# Patient Record
Sex: Female | Born: 1980 | Race: White | Hispanic: No | Marital: Single | Smoking: Current every day smoker
Health system: Southern US, Community
[De-identification: ages and names within clinical notes are randomized; demographics above are authoritative.]

## PROBLEM LIST (undated history)

## (undated) ENCOUNTER — Inpatient Hospital Stay (HOSPITAL_COMMUNITY): Payer: Self-pay

## (undated) DIAGNOSIS — O139 Gestational [pregnancy-induced] hypertension without significant proteinuria, unspecified trimester: Secondary | ICD-10-CM

## (undated) DIAGNOSIS — F1110 Opioid abuse, uncomplicated: Secondary | ICD-10-CM

## (undated) DIAGNOSIS — F419 Anxiety disorder, unspecified: Secondary | ICD-10-CM

## (undated) DIAGNOSIS — N200 Calculus of kidney: Secondary | ICD-10-CM

## (undated) DIAGNOSIS — Z3490 Encounter for supervision of normal pregnancy, unspecified, unspecified trimester: Secondary | ICD-10-CM

## (undated) DIAGNOSIS — F99 Mental disorder, not otherwise specified: Secondary | ICD-10-CM

## (undated) DIAGNOSIS — Z22322 Carrier or suspected carrier of Methicillin resistant Staphylococcus aureus: Secondary | ICD-10-CM

## (undated) HISTORY — DX: Gestational (pregnancy-induced) hypertension without significant proteinuria, unspecified trimester: O13.9

## (undated) HISTORY — DX: Opioid abuse, uncomplicated: F11.10

---
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
## 1999-02-03 ENCOUNTER — Emergency Department (HOSPITAL_COMMUNITY): Admission: EM | Admit: 1999-02-03 | Discharge: 1999-02-04 | Payer: Self-pay | Admitting: Emergency Medicine

## 1999-07-24 ENCOUNTER — Emergency Department (HOSPITAL_COMMUNITY): Admission: EM | Admit: 1999-07-24 | Discharge: 1999-07-24 | Payer: Self-pay | Admitting: Emergency Medicine

## 1999-11-25 ENCOUNTER — Encounter: Payer: Self-pay | Admitting: General Practice

## 1999-11-25 ENCOUNTER — Ambulatory Visit (HOSPITAL_COMMUNITY): Admission: RE | Admit: 1999-11-25 | Discharge: 1999-11-25 | Payer: Self-pay | Admitting: General Practice

## 2000-01-20 ENCOUNTER — Inpatient Hospital Stay (HOSPITAL_COMMUNITY): Admission: AD | Admit: 2000-01-20 | Discharge: 2000-01-20 | Payer: Self-pay | Admitting: Obstetrics and Gynecology

## 2000-01-20 ENCOUNTER — Encounter: Payer: Self-pay | Admitting: Obstetrics & Gynecology

## 2000-02-06 ENCOUNTER — Inpatient Hospital Stay (HOSPITAL_COMMUNITY): Admission: AD | Admit: 2000-02-06 | Discharge: 2000-02-06 | Payer: Self-pay | Admitting: Obstetrics and Gynecology

## 2000-02-06 ENCOUNTER — Encounter: Payer: Self-pay | Admitting: Obstetrics and Gynecology

## 2000-02-07 ENCOUNTER — Inpatient Hospital Stay (HOSPITAL_COMMUNITY): Admission: AD | Admit: 2000-02-07 | Discharge: 2000-02-07 | Payer: Self-pay | Admitting: Obstetrics and Gynecology

## 2000-03-16 ENCOUNTER — Encounter: Payer: Self-pay | Admitting: Obstetrics & Gynecology

## 2000-03-16 ENCOUNTER — Ambulatory Visit (HOSPITAL_COMMUNITY): Admission: RE | Admit: 2000-03-16 | Discharge: 2000-03-16 | Payer: Self-pay | Admitting: Obstetrics & Gynecology

## 2000-05-03 ENCOUNTER — Encounter: Payer: Self-pay | Admitting: Obstetrics & Gynecology

## 2000-05-03 ENCOUNTER — Observation Stay (HOSPITAL_COMMUNITY): Admission: AD | Admit: 2000-05-03 | Discharge: 2000-05-04 | Payer: Self-pay | Admitting: Obstetrics and Gynecology

## 2000-06-10 ENCOUNTER — Inpatient Hospital Stay (HOSPITAL_COMMUNITY): Admission: AD | Admit: 2000-06-10 | Discharge: 2000-06-10 | Payer: Self-pay | Admitting: Obstetrics and Gynecology

## 2000-06-11 ENCOUNTER — Inpatient Hospital Stay (HOSPITAL_COMMUNITY): Admission: AD | Admit: 2000-06-11 | Discharge: 2000-06-11 | Payer: Self-pay | Admitting: Obstetrics and Gynecology

## 2000-07-18 ENCOUNTER — Inpatient Hospital Stay (HOSPITAL_COMMUNITY): Admission: AD | Admit: 2000-07-18 | Discharge: 2000-07-18 | Payer: Self-pay | Admitting: Obstetrics and Gynecology

## 2000-07-26 ENCOUNTER — Inpatient Hospital Stay (HOSPITAL_COMMUNITY): Admission: AD | Admit: 2000-07-26 | Discharge: 2000-07-28 | Payer: Self-pay | Admitting: Obstetrics and Gynecology

## 2000-09-06 ENCOUNTER — Other Ambulatory Visit: Admission: RE | Admit: 2000-09-06 | Discharge: 2000-09-06 | Payer: Self-pay | Admitting: Obstetrics and Gynecology

## 2001-07-05 ENCOUNTER — Other Ambulatory Visit: Admission: RE | Admit: 2001-07-05 | Discharge: 2001-07-05 | Payer: Self-pay | Admitting: Obstetrics and Gynecology

## 2002-07-17 ENCOUNTER — Other Ambulatory Visit: Admission: RE | Admit: 2002-07-17 | Discharge: 2002-07-17 | Payer: Self-pay | Admitting: Family Medicine

## 2002-08-05 ENCOUNTER — Encounter: Payer: Self-pay | Admitting: Family Medicine

## 2002-08-05 ENCOUNTER — Encounter: Admission: RE | Admit: 2002-08-05 | Discharge: 2002-08-05 | Payer: Self-pay | Admitting: Family Medicine

## 2002-11-20 ENCOUNTER — Inpatient Hospital Stay (HOSPITAL_COMMUNITY): Admission: AD | Admit: 2002-11-20 | Discharge: 2002-11-20 | Payer: Self-pay | Admitting: Obstetrics and Gynecology

## 2004-04-17 ENCOUNTER — Emergency Department (HOSPITAL_COMMUNITY): Admission: EM | Admit: 2004-04-17 | Discharge: 2004-04-17 | Payer: Self-pay | Admitting: Emergency Medicine

## 2004-04-19 ENCOUNTER — Emergency Department (HOSPITAL_COMMUNITY): Admission: EM | Admit: 2004-04-19 | Discharge: 2004-04-19 | Payer: Self-pay | Admitting: Emergency Medicine

## 2004-04-28 ENCOUNTER — Emergency Department (HOSPITAL_COMMUNITY): Admission: EM | Admit: 2004-04-28 | Discharge: 2004-04-28 | Payer: Self-pay | Admitting: *Deleted

## 2004-04-29 ENCOUNTER — Inpatient Hospital Stay (HOSPITAL_COMMUNITY): Admission: EM | Admit: 2004-04-29 | Discharge: 2004-05-04 | Payer: Self-pay | Admitting: Emergency Medicine

## 2004-04-29 ENCOUNTER — Ambulatory Visit: Payer: Self-pay | Admitting: Infectious Diseases

## 2004-04-30 ENCOUNTER — Ambulatory Visit: Payer: Self-pay | Admitting: Infectious Diseases

## 2004-12-09 ENCOUNTER — Ambulatory Visit: Payer: Self-pay | Admitting: Family Medicine

## 2005-02-08 ENCOUNTER — Inpatient Hospital Stay (HOSPITAL_COMMUNITY): Admission: AD | Admit: 2005-02-08 | Discharge: 2005-02-08 | Payer: Self-pay | Admitting: *Deleted

## 2005-04-02 ENCOUNTER — Emergency Department (HOSPITAL_COMMUNITY): Admission: EM | Admit: 2005-04-02 | Discharge: 2005-04-02 | Payer: Self-pay

## 2005-04-18 ENCOUNTER — Emergency Department (HOSPITAL_COMMUNITY): Admission: EM | Admit: 2005-04-18 | Discharge: 2005-04-18 | Payer: Self-pay | Admitting: Emergency Medicine

## 2005-10-05 ENCOUNTER — Ambulatory Visit: Payer: Self-pay | Admitting: Family Medicine

## 2005-11-04 ENCOUNTER — Ambulatory Visit: Payer: Self-pay | Admitting: Family Medicine

## 2005-12-19 ENCOUNTER — Ambulatory Visit: Payer: Self-pay | Admitting: Family Medicine

## 2006-01-10 ENCOUNTER — Emergency Department (HOSPITAL_COMMUNITY): Admission: EM | Admit: 2006-01-10 | Discharge: 2006-01-10 | Payer: Self-pay | Admitting: Emergency Medicine

## 2006-03-03 ENCOUNTER — Emergency Department (HOSPITAL_COMMUNITY): Admission: EM | Admit: 2006-03-03 | Discharge: 2006-03-03 | Payer: Self-pay | Admitting: Emergency Medicine

## 2006-05-09 ENCOUNTER — Emergency Department (HOSPITAL_COMMUNITY): Admission: EM | Admit: 2006-05-09 | Discharge: 2006-05-09 | Payer: Self-pay | Admitting: Emergency Medicine

## 2006-07-27 ENCOUNTER — Ambulatory Visit: Payer: Self-pay | Admitting: Family Medicine

## 2006-08-01 ENCOUNTER — Ambulatory Visit: Payer: Self-pay | Admitting: Family Medicine

## 2006-10-13 ENCOUNTER — Encounter: Payer: Self-pay | Admitting: Family Medicine

## 2006-10-13 ENCOUNTER — Other Ambulatory Visit: Admission: RE | Admit: 2006-10-13 | Discharge: 2006-10-13 | Payer: Self-pay | Admitting: Family Medicine

## 2006-10-13 ENCOUNTER — Ambulatory Visit: Payer: Self-pay | Admitting: Family Medicine

## 2006-10-13 LAB — CONVERTED CEMR LAB

## 2006-12-12 ENCOUNTER — Ambulatory Visit: Payer: Self-pay | Admitting: Family Medicine

## 2007-01-03 ENCOUNTER — Ambulatory Visit: Payer: Self-pay | Admitting: Internal Medicine

## 2007-03-05 ENCOUNTER — Ambulatory Visit: Payer: Self-pay | Admitting: Family Medicine

## 2007-03-05 ENCOUNTER — Telehealth: Payer: Self-pay | Admitting: Family Medicine

## 2007-03-06 ENCOUNTER — Telehealth: Payer: Self-pay | Admitting: Family Medicine

## 2007-03-29 ENCOUNTER — Ambulatory Visit: Payer: Self-pay | Admitting: Family Medicine

## 2007-03-29 DIAGNOSIS — F329 Major depressive disorder, single episode, unspecified: Secondary | ICD-10-CM

## 2007-04-10 ENCOUNTER — Ambulatory Visit: Payer: Self-pay | Admitting: Family Medicine

## 2007-04-10 DIAGNOSIS — J309 Allergic rhinitis, unspecified: Secondary | ICD-10-CM | POA: Insufficient documentation

## 2007-04-10 DIAGNOSIS — F17200 Nicotine dependence, unspecified, uncomplicated: Secondary | ICD-10-CM

## 2007-04-15 ENCOUNTER — Emergency Department (HOSPITAL_COMMUNITY): Admission: EM | Admit: 2007-04-15 | Discharge: 2007-04-15 | Payer: Self-pay | Admitting: Emergency Medicine

## 2007-04-17 ENCOUNTER — Telehealth: Payer: Self-pay | Admitting: Internal Medicine

## 2007-04-23 ENCOUNTER — Emergency Department (HOSPITAL_COMMUNITY): Admission: EM | Admit: 2007-04-23 | Discharge: 2007-04-23 | Payer: Self-pay | Admitting: Emergency Medicine

## 2007-05-08 ENCOUNTER — Ambulatory Visit: Payer: Self-pay | Admitting: Family Medicine

## 2007-05-08 DIAGNOSIS — J45901 Unspecified asthma with (acute) exacerbation: Secondary | ICD-10-CM | POA: Insufficient documentation

## 2007-05-10 ENCOUNTER — Emergency Department (HOSPITAL_COMMUNITY): Admission: EM | Admit: 2007-05-10 | Discharge: 2007-05-10 | Payer: Self-pay | Admitting: Emergency Medicine

## 2007-05-22 ENCOUNTER — Telehealth: Payer: Self-pay | Admitting: Family Medicine

## 2007-06-05 ENCOUNTER — Emergency Department (HOSPITAL_COMMUNITY): Admission: EM | Admit: 2007-06-05 | Discharge: 2007-06-05 | Payer: Self-pay | Admitting: Emergency Medicine

## 2007-06-05 ENCOUNTER — Telehealth: Payer: Self-pay | Admitting: *Deleted

## 2007-06-11 ENCOUNTER — Ambulatory Visit: Payer: Self-pay | Admitting: Family Medicine

## 2007-06-11 DIAGNOSIS — R51 Headache: Secondary | ICD-10-CM | POA: Insufficient documentation

## 2007-06-11 DIAGNOSIS — R519 Headache, unspecified: Secondary | ICD-10-CM | POA: Insufficient documentation

## 2007-06-12 ENCOUNTER — Telehealth: Payer: Self-pay | Admitting: Family Medicine

## 2007-06-23 DIAGNOSIS — L0201 Cutaneous abscess of face: Secondary | ICD-10-CM

## 2007-06-23 DIAGNOSIS — L03211 Cellulitis of face: Secondary | ICD-10-CM

## 2007-06-26 ENCOUNTER — Ambulatory Visit: Payer: Self-pay | Admitting: Family Medicine

## 2007-06-28 ENCOUNTER — Ambulatory Visit: Payer: Self-pay | Admitting: Family Medicine

## 2007-07-03 ENCOUNTER — Telehealth: Payer: Self-pay | Admitting: Family Medicine

## 2007-07-09 ENCOUNTER — Telehealth: Payer: Self-pay | Admitting: Family Medicine

## 2007-07-09 ENCOUNTER — Ambulatory Visit: Payer: Self-pay | Admitting: Family Medicine

## 2007-07-17 ENCOUNTER — Ambulatory Visit: Payer: Self-pay | Admitting: Family Medicine

## 2007-07-17 DIAGNOSIS — F19939 Other psychoactive substance use, unspecified with withdrawal, unspecified: Secondary | ICD-10-CM | POA: Insufficient documentation

## 2007-07-23 ENCOUNTER — Emergency Department (HOSPITAL_COMMUNITY): Admission: EM | Admit: 2007-07-23 | Discharge: 2007-07-23 | Payer: Self-pay | Admitting: Emergency Medicine

## 2007-07-27 ENCOUNTER — Ambulatory Visit: Payer: Self-pay | Admitting: Internal Medicine

## 2007-07-27 DIAGNOSIS — M79609 Pain in unspecified limb: Secondary | ICD-10-CM

## 2007-08-20 ENCOUNTER — Telehealth: Payer: Self-pay | Admitting: Internal Medicine

## 2007-08-24 ENCOUNTER — Telehealth: Payer: Self-pay | Admitting: Internal Medicine

## 2007-08-31 ENCOUNTER — Ambulatory Visit: Payer: Self-pay | Admitting: Family Medicine

## 2007-08-31 ENCOUNTER — Telehealth: Payer: Self-pay | Admitting: Family Medicine

## 2007-08-31 ENCOUNTER — Emergency Department (HOSPITAL_COMMUNITY): Admission: EM | Admit: 2007-08-31 | Discharge: 2007-08-31 | Payer: Self-pay | Admitting: Emergency Medicine

## 2007-08-31 DIAGNOSIS — M542 Cervicalgia: Secondary | ICD-10-CM

## 2007-09-04 ENCOUNTER — Ambulatory Visit: Payer: Self-pay | Admitting: Family Medicine

## 2007-09-10 ENCOUNTER — Ambulatory Visit: Payer: Self-pay | Admitting: Family Medicine

## 2007-09-10 DIAGNOSIS — J069 Acute upper respiratory infection, unspecified: Secondary | ICD-10-CM | POA: Insufficient documentation

## 2007-09-24 ENCOUNTER — Telehealth: Payer: Self-pay | Admitting: Family Medicine

## 2007-09-26 ENCOUNTER — Telehealth: Payer: Self-pay | Admitting: Family Medicine

## 2007-10-04 ENCOUNTER — Emergency Department (HOSPITAL_COMMUNITY): Admission: EM | Admit: 2007-10-04 | Discharge: 2007-10-04 | Payer: Self-pay | Admitting: Advanced Practice Midwife

## 2007-10-11 ENCOUNTER — Ambulatory Visit: Payer: Self-pay | Admitting: Family Medicine

## 2007-10-11 DIAGNOSIS — IMO0002 Reserved for concepts with insufficient information to code with codable children: Secondary | ICD-10-CM | POA: Insufficient documentation

## 2007-11-17 ENCOUNTER — Emergency Department (HOSPITAL_COMMUNITY): Admission: EM | Admit: 2007-11-17 | Discharge: 2007-11-17 | Payer: Self-pay | Admitting: Emergency Medicine

## 2007-11-23 ENCOUNTER — Telehealth: Payer: Self-pay | Admitting: Family Medicine

## 2007-11-24 ENCOUNTER — Emergency Department (HOSPITAL_COMMUNITY): Admission: EM | Admit: 2007-11-24 | Discharge: 2007-11-24 | Payer: Self-pay | Admitting: Emergency Medicine

## 2007-11-26 ENCOUNTER — Telehealth: Payer: Self-pay | Admitting: Family Medicine

## 2007-12-04 ENCOUNTER — Ambulatory Visit: Payer: Self-pay | Admitting: Family Medicine

## 2007-12-10 ENCOUNTER — Telehealth: Payer: Self-pay | Admitting: Family Medicine

## 2007-12-11 ENCOUNTER — Telehealth: Payer: Self-pay | Admitting: Family Medicine

## 2007-12-13 ENCOUNTER — Ambulatory Visit: Payer: Self-pay | Admitting: Licensed Clinical Social Worker

## 2007-12-14 ENCOUNTER — Ambulatory Visit: Payer: Self-pay | Admitting: Family Medicine

## 2007-12-14 DIAGNOSIS — F411 Generalized anxiety disorder: Secondary | ICD-10-CM | POA: Insufficient documentation

## 2007-12-20 ENCOUNTER — Telehealth: Payer: Self-pay | Admitting: Family Medicine

## 2007-12-22 DIAGNOSIS — IMO0001 Reserved for inherently not codable concepts without codable children: Secondary | ICD-10-CM

## 2007-12-24 ENCOUNTER — Ambulatory Visit: Payer: Self-pay | Admitting: Family Medicine

## 2007-12-25 ENCOUNTER — Ambulatory Visit: Payer: Self-pay | Admitting: Licensed Clinical Social Worker

## 2008-01-01 ENCOUNTER — Ambulatory Visit: Payer: Self-pay | Admitting: Family Medicine

## 2008-01-01 DIAGNOSIS — E282 Polycystic ovarian syndrome: Secondary | ICD-10-CM | POA: Insufficient documentation

## 2008-01-01 LAB — CONVERTED CEMR LAB
Alkaline Phosphatase: 73 units/L (ref 39–117)
Basophils Absolute: 0.3 10*3/uL — ABNORMAL HIGH (ref 0.0–0.1)
Bilirubin, Direct: 0.1 mg/dL (ref 0.0–0.3)
CO2: 27 meq/L (ref 19–32)
Calcium: 9.6 mg/dL (ref 8.4–10.5)
FSH: 5.9 milliintl units/mL
GFR calc Af Amer: 96 mL/min
Glucose, Bld: 102 mg/dL — ABNORMAL HIGH (ref 70–99)
LH: 6.5 milliintl units/mL
Lymphocytes Relative: 18.3 % (ref 12.0–46.0)
MCHC: 35.3 g/dL (ref 30.0–36.0)
Monocytes Absolute: 0.1 10*3/uL (ref 0.1–1.0)
Monocytes Relative: 1.2 % — ABNORMAL LOW (ref 3.0–12.0)
Platelets: 173 10*3/uL (ref 150–400)
Potassium: 3.4 meq/L — ABNORMAL LOW (ref 3.5–5.1)
RDW: 12.5 % (ref 11.5–14.6)
Sodium: 142 meq/L (ref 135–145)
TSH: 0.87 microintl units/mL (ref 0.35–5.50)
Total Bilirubin: 0.8 mg/dL (ref 0.3–1.2)
Total Protein: 7.5 g/dL (ref 6.0–8.3)

## 2008-01-03 ENCOUNTER — Encounter: Admission: RE | Admit: 2008-01-03 | Discharge: 2008-01-03 | Payer: Self-pay | Admitting: Family Medicine

## 2008-01-03 ENCOUNTER — Encounter: Payer: Self-pay | Admitting: Family Medicine

## 2008-01-03 LAB — CONVERTED CEMR LAB: Testosterone: 105.43 ng/dL — ABNORMAL HIGH (ref 10–70)

## 2008-01-04 ENCOUNTER — Telehealth (INDEPENDENT_AMBULATORY_CARE_PROVIDER_SITE_OTHER): Payer: Self-pay | Admitting: *Deleted

## 2008-01-10 ENCOUNTER — Telehealth: Payer: Self-pay | Admitting: Family Medicine

## 2008-01-16 ENCOUNTER — Telehealth: Payer: Self-pay | Admitting: Family Medicine

## 2008-01-28 ENCOUNTER — Ambulatory Visit: Payer: Self-pay | Admitting: Family Medicine

## 2008-01-28 DIAGNOSIS — B079 Viral wart, unspecified: Secondary | ICD-10-CM | POA: Insufficient documentation

## 2008-02-08 ENCOUNTER — Telehealth: Payer: Self-pay | Admitting: Endocrinology

## 2008-02-08 ENCOUNTER — Ambulatory Visit: Payer: Self-pay | Admitting: Endocrinology

## 2008-02-11 ENCOUNTER — Emergency Department (HOSPITAL_COMMUNITY): Admission: EM | Admit: 2008-02-11 | Discharge: 2008-02-11 | Payer: Self-pay | Admitting: Emergency Medicine

## 2008-02-14 ENCOUNTER — Telehealth: Payer: Self-pay | Admitting: Endocrinology

## 2008-02-16 ENCOUNTER — Emergency Department (HOSPITAL_BASED_OUTPATIENT_CLINIC_OR_DEPARTMENT_OTHER): Admission: EM | Admit: 2008-02-16 | Discharge: 2008-02-16 | Payer: Self-pay | Admitting: Emergency Medicine

## 2008-02-18 ENCOUNTER — Telehealth: Payer: Self-pay | Admitting: Family Medicine

## 2008-02-19 ENCOUNTER — Telehealth: Payer: Self-pay | Admitting: Family Medicine

## 2008-02-20 ENCOUNTER — Telehealth: Payer: Self-pay | Admitting: *Deleted

## 2008-02-27 ENCOUNTER — Telehealth: Payer: Self-pay | Admitting: Family Medicine

## 2008-02-28 ENCOUNTER — Ambulatory Visit: Payer: Self-pay | Admitting: Family Medicine

## 2008-03-11 ENCOUNTER — Telehealth: Payer: Self-pay | Admitting: *Deleted

## 2008-03-17 ENCOUNTER — Ambulatory Visit: Payer: Self-pay | Admitting: Internal Medicine

## 2008-03-27 ENCOUNTER — Telehealth: Payer: Self-pay | Admitting: *Deleted

## 2008-03-31 ENCOUNTER — Ambulatory Visit: Payer: Self-pay | Admitting: Family Medicine

## 2008-04-29 ENCOUNTER — Telehealth: Payer: Self-pay | Admitting: Family Medicine

## 2008-05-01 ENCOUNTER — Ambulatory Visit: Payer: Self-pay | Admitting: Family Medicine

## 2008-05-06 ENCOUNTER — Telehealth: Payer: Self-pay | Admitting: Family Medicine

## 2008-05-06 ENCOUNTER — Emergency Department (HOSPITAL_COMMUNITY): Admission: EM | Admit: 2008-05-06 | Discharge: 2008-05-06 | Payer: Self-pay | Admitting: Emergency Medicine

## 2008-05-12 ENCOUNTER — Telehealth: Payer: Self-pay | Admitting: Family Medicine

## 2008-05-13 ENCOUNTER — Ambulatory Visit: Payer: Self-pay | Admitting: Family Medicine

## 2008-06-23 ENCOUNTER — Ambulatory Visit: Payer: Self-pay | Admitting: Family Medicine

## 2008-06-23 LAB — CONVERTED CEMR LAB
Amphetamine Screen, Ur: NEGATIVE
BUN: 6 mg/dL (ref 6–23)
Barbiturate Quant, Ur: NEGATIVE
CO2: 29 meq/L (ref 19–32)
Chloride: 107 meq/L (ref 96–112)
Cocaine Metabolites: NEGATIVE
Creatinine,U: 89.3 mg/dL
Eosinophils Relative: 0.7 % (ref 0.0–5.0)
GFR calc Af Amer: 110 mL/min
Glucose, Bld: 91 mg/dL (ref 70–99)
HCT: 43.7 % (ref 36.0–46.0)
Lymphocytes Relative: 23.3 % (ref 12.0–46.0)
Marijuana Metabolite: NEGATIVE
Monocytes Relative: 5.2 % (ref 3.0–12.0)
Neutrophils Relative %: 70.4 % (ref 43.0–77.0)
Opiates: POSITIVE — AB
Phencyclidine (PCP): NEGATIVE
Platelets: 173 10*3/uL (ref 150–400)
Potassium: 4.1 meq/L (ref 3.5–5.1)
RDW: 11.7 % (ref 11.5–14.6)
Sodium: 143 meq/L (ref 135–145)
WBC: 11.8 10*3/uL — ABNORMAL HIGH (ref 4.5–10.5)

## 2008-06-27 ENCOUNTER — Telehealth: Payer: Self-pay | Admitting: Family Medicine

## 2008-07-02 ENCOUNTER — Ambulatory Visit: Payer: Self-pay | Admitting: Family Medicine

## 2008-07-05 ENCOUNTER — Telehealth: Payer: Self-pay | Admitting: Family Medicine

## 2008-07-07 ENCOUNTER — Telehealth: Payer: Self-pay | Admitting: Family Medicine

## 2008-07-26 ENCOUNTER — Emergency Department (HOSPITAL_COMMUNITY): Admission: EM | Admit: 2008-07-26 | Discharge: 2008-07-26 | Payer: Self-pay | Admitting: Emergency Medicine

## 2008-07-30 ENCOUNTER — Telehealth: Payer: Self-pay | Admitting: *Deleted

## 2008-08-11 ENCOUNTER — Telehealth: Payer: Self-pay | Admitting: Family Medicine

## 2008-08-11 ENCOUNTER — Emergency Department (HOSPITAL_COMMUNITY): Admission: EM | Admit: 2008-08-11 | Discharge: 2008-08-11 | Payer: Self-pay | Admitting: Emergency Medicine

## 2008-09-03 ENCOUNTER — Telehealth: Payer: Self-pay | Admitting: Family Medicine

## 2008-09-11 ENCOUNTER — Telehealth: Payer: Self-pay | Admitting: Family Medicine

## 2008-09-18 ENCOUNTER — Telehealth: Payer: Self-pay | Admitting: Family Medicine

## 2008-10-01 ENCOUNTER — Telehealth: Payer: Self-pay | Admitting: Family Medicine

## 2008-10-06 ENCOUNTER — Telehealth: Payer: Self-pay | Admitting: Family Medicine

## 2008-10-21 ENCOUNTER — Telehealth: Payer: Self-pay | Admitting: *Deleted

## 2008-10-22 ENCOUNTER — Ambulatory Visit: Payer: Self-pay | Admitting: Family Medicine

## 2008-10-22 DIAGNOSIS — M545 Low back pain: Secondary | ICD-10-CM | POA: Insufficient documentation

## 2008-11-04 ENCOUNTER — Telehealth: Payer: Self-pay | Admitting: Internal Medicine

## 2008-11-10 ENCOUNTER — Telehealth: Payer: Self-pay | Admitting: Family Medicine

## 2008-11-13 ENCOUNTER — Ambulatory Visit: Payer: Self-pay | Admitting: Family Medicine

## 2008-11-24 ENCOUNTER — Emergency Department (HOSPITAL_COMMUNITY): Admission: EM | Admit: 2008-11-24 | Discharge: 2008-11-24 | Payer: Self-pay | Admitting: Emergency Medicine

## 2008-12-01 ENCOUNTER — Telehealth: Payer: Self-pay | Admitting: Family Medicine

## 2008-12-08 ENCOUNTER — Telehealth: Payer: Self-pay | Admitting: Family Medicine

## 2009-02-24 ENCOUNTER — Telehealth: Payer: Self-pay | Admitting: *Deleted

## 2009-03-04 ENCOUNTER — Emergency Department (HOSPITAL_BASED_OUTPATIENT_CLINIC_OR_DEPARTMENT_OTHER): Admission: EM | Admit: 2009-03-04 | Discharge: 2009-03-04 | Payer: Self-pay | Admitting: Emergency Medicine

## 2009-03-04 ENCOUNTER — Ambulatory Visit: Payer: Self-pay | Admitting: Diagnostic Radiology

## 2009-03-17 ENCOUNTER — Telehealth: Payer: Self-pay | Admitting: Family Medicine

## 2009-04-24 ENCOUNTER — Ambulatory Visit: Payer: Self-pay | Admitting: Interventional Radiology

## 2009-04-24 ENCOUNTER — Emergency Department (HOSPITAL_BASED_OUTPATIENT_CLINIC_OR_DEPARTMENT_OTHER): Admission: EM | Admit: 2009-04-24 | Discharge: 2009-04-24 | Payer: Self-pay | Admitting: Emergency Medicine

## 2009-04-28 ENCOUNTER — Emergency Department (HOSPITAL_BASED_OUTPATIENT_CLINIC_OR_DEPARTMENT_OTHER): Admission: EM | Admit: 2009-04-28 | Discharge: 2009-04-28 | Payer: Self-pay | Admitting: Emergency Medicine

## 2009-04-28 ENCOUNTER — Ambulatory Visit: Payer: Self-pay | Admitting: Diagnostic Radiology

## 2009-05-11 ENCOUNTER — Telehealth: Payer: Self-pay | Admitting: Family Medicine

## 2009-05-14 ENCOUNTER — Emergency Department (HOSPITAL_BASED_OUTPATIENT_CLINIC_OR_DEPARTMENT_OTHER): Admission: EM | Admit: 2009-05-14 | Discharge: 2009-05-14 | Payer: Self-pay | Admitting: Emergency Medicine

## 2009-06-29 ENCOUNTER — Telehealth: Payer: Self-pay | Admitting: Family Medicine

## 2009-07-01 ENCOUNTER — Telehealth: Payer: Self-pay | Admitting: Family Medicine

## 2009-07-06 ENCOUNTER — Telehealth: Payer: Self-pay | Admitting: *Deleted

## 2009-08-20 ENCOUNTER — Emergency Department (HOSPITAL_COMMUNITY): Admission: EM | Admit: 2009-08-20 | Discharge: 2009-08-20 | Payer: Self-pay | Admitting: Emergency Medicine

## 2009-09-18 ENCOUNTER — Telehealth: Payer: Self-pay | Admitting: Family Medicine

## 2009-10-01 ENCOUNTER — Ambulatory Visit: Payer: Self-pay | Admitting: Family Medicine

## 2009-10-04 ENCOUNTER — Encounter: Payer: Self-pay | Admitting: Family Medicine

## 2009-10-05 ENCOUNTER — Telehealth: Payer: Self-pay | Admitting: Family Medicine

## 2009-10-15 ENCOUNTER — Telehealth: Payer: Self-pay | Admitting: Family Medicine

## 2009-11-09 ENCOUNTER — Ambulatory Visit: Payer: Self-pay | Admitting: Family Medicine

## 2009-11-10 ENCOUNTER — Emergency Department (HOSPITAL_COMMUNITY): Admission: EM | Admit: 2009-11-10 | Discharge: 2009-11-10 | Payer: Self-pay | Admitting: Emergency Medicine

## 2009-11-16 ENCOUNTER — Telehealth: Payer: Self-pay | Admitting: Internal Medicine

## 2009-11-17 ENCOUNTER — Telehealth: Payer: Self-pay

## 2009-11-18 ENCOUNTER — Telehealth: Payer: Self-pay | Admitting: Family Medicine

## 2009-11-24 ENCOUNTER — Encounter: Payer: Self-pay | Admitting: Family Medicine

## 2009-12-03 ENCOUNTER — Telehealth (INDEPENDENT_AMBULATORY_CARE_PROVIDER_SITE_OTHER): Payer: Self-pay | Admitting: *Deleted

## 2009-12-10 ENCOUNTER — Emergency Department (HOSPITAL_COMMUNITY): Admission: EM | Admit: 2009-12-10 | Discharge: 2009-12-10 | Payer: Self-pay | Admitting: Emergency Medicine

## 2010-01-10 ENCOUNTER — Emergency Department (HOSPITAL_COMMUNITY): Admission: EM | Admit: 2010-01-10 | Discharge: 2010-01-10 | Payer: Self-pay | Admitting: Emergency Medicine

## 2010-01-17 ENCOUNTER — Emergency Department (HOSPITAL_COMMUNITY): Admission: EM | Admit: 2010-01-17 | Discharge: 2010-01-17 | Payer: Self-pay | Admitting: Emergency Medicine

## 2010-01-27 ENCOUNTER — Emergency Department (HOSPITAL_COMMUNITY): Admission: EM | Admit: 2010-01-27 | Discharge: 2010-01-27 | Payer: Self-pay | Admitting: Emergency Medicine

## 2010-02-13 ENCOUNTER — Emergency Department: Payer: Self-pay | Admitting: Emergency Medicine

## 2010-02-19 ENCOUNTER — Emergency Department (HOSPITAL_COMMUNITY)
Admission: EM | Admit: 2010-02-19 | Discharge: 2010-02-19 | Payer: Self-pay | Source: Home / Self Care | Admitting: Emergency Medicine

## 2010-03-01 ENCOUNTER — Emergency Department (HOSPITAL_COMMUNITY): Admission: EM | Admit: 2010-03-01 | Discharge: 2010-03-01 | Payer: Self-pay | Admitting: Emergency Medicine

## 2010-07-06 ENCOUNTER — Emergency Department (HOSPITAL_COMMUNITY): Admission: EM | Admit: 2010-07-06 | Discharge: 2010-07-06 | Payer: Self-pay | Admitting: Emergency Medicine

## 2010-07-13 ENCOUNTER — Encounter: Admission: RE | Admit: 2010-07-13 | Discharge: 2010-07-13 | Payer: Self-pay | Admitting: Geriatric Medicine

## 2010-08-05 ENCOUNTER — Emergency Department (HOSPITAL_BASED_OUTPATIENT_CLINIC_OR_DEPARTMENT_OTHER): Admission: EM | Admit: 2010-08-05 | Discharge: 2010-03-22 | Payer: Self-pay | Admitting: Emergency Medicine

## 2010-09-18 ENCOUNTER — Encounter: Payer: Self-pay | Admitting: Emergency Medicine

## 2010-09-19 ENCOUNTER — Encounter: Payer: Self-pay | Admitting: Geriatric Medicine

## 2010-09-28 NOTE — Progress Notes (Signed)
Summary: Dismissal from the pracrice question  This patient is dismissed from [facility] by Kelle Darting, MD. Do you agree with the dismissal or do you wish to continue to treat this patient? Thank You. Rene Kocher Flowers  December 03, 2009 2:21 PM   Appended Document: Agree with dismissal my policy is that if another [place] dismisses, i dismiss also, so ok with me.

## 2010-09-28 NOTE — Letter (Signed)
Summary: Discharge Letter  [place] at [facility]  [address] [place], [place] [address]   Phone: [phone]  Fax: [phone]       11/24/2009 MRN: [id number]  [name] [address] [place], [place]  [address]  Dear Rebecca [name],   I find it necessary to inform you that I will not be able to provide medical care to you, because you have obtained pain medications from another physician, which is a violation of the signed controlled substanced contract.  Since your condition requires medical attention, I suggest that you place your self under the care of another physician without delay. If you desire, I will be available for emergency care for 30 days after you receive this letter.  This should give you ample time to select a physician of your choice from the many competent providers in this area. You may want to call the local medical society or [facility]'s physician referral service [phone]) for their assistance in locating a new physician. With your written authorization, I will make a copy of your medical record available to your new physician.   Sincerely,    Eugenio Hoes. Tawanna Cooler, MD  Appended Document: Discharge Letter IDX and EMR have been updated to reflect patient discharge. Letter sent out by Certified mail.  Appended Document: Discharge Letter Received USPS receipt signed by patient verifying delivery.

## 2010-09-28 NOTE — Progress Notes (Signed)
Summary: Pt req early refill auth for cough syrup  Phone Note Call from Patient Call back at [phone] or [phone] cell   Caller: Patient Summary of Call: Pt called and said that Dr. Tawanna Cooler had prescribed a cough syrup for her. Pt said that everyone in her family has been sick and has been taking her cough syrup. Pt is wondering if Dr. Tawanna Cooler would authorize an early refill of med. [facility].  Initial call taken by: Lucy Antigua,  October 05, 2009 10:43 AM  Follow-up for Phone Call        no...............cannot give the medicine to family members Follow-up by: Roderick Pee MD,  October 05, 2009 11:36 AM  Additional Follow-up for Phone Call Additional follow up Details #1::        pt is aware of above. Wants to speak with Fleet Contras. Additional Follow-up by: Warnell Forester,  October 05, 2009 1:06 PM    Additional Follow-up for Phone Call Additional follow up Details #2::     Pt called and said that she was not aware that she could not share medicine, and that she is now aware and she will not share meds again. Pt says she is completely out of cough syrup.  Follow-up by: Lucy Antigua,  October 05, 2009 1:15 PM  Additional Follow-up for Phone Call Additional follow up Details #3:: Details for Additional Follow-up Action Taken: PT Cleotis Lema, PT IS OFFICE CLOSE AT 5PM  Additional Follow-up by: Heron Sabins,  October 05, 2009 3:17 PM

## 2010-09-28 NOTE — Assessment & Plan Note (Signed)
Summary: SORE THROAT, CONGESTION // RS   Vital Signs:  Patient profile:   30 year old female Height:      68 inches Weight:      222 pounds BMI:     33.88 Temp:     99.3 degrees F oral BP sitting:   126 / 86  (left arm) Cuff size:   regular  Vitals Entered By: Kern Reap CMA Duncan Dull) (October 01, 2009 12:51 PM)  Reason for Visit dry cough, fever, headache  History of Present Illness: Rebecca [name] is a 30 year old single female, smoker, but has decreased her consumption to 3 cigarettes a day, because she had a cold for 3 days.  She said congestion running nose, and cough.  No wheezing, no sputum production.  Review of systems 12 point negative.  We talked about smoking cessation.  She would like to try again.  We will start her on the chantix program.  She would also like a refill of her Vicodin.  She takes one tablet in the morning because of chronic back pain.  She works full time and is supposed to get insurance in March at that point time, she would like to see the folks at [facility] spine, who have helped her sister.  We will have her sign a pain contract.  Last contract 2009  Lamiah is now living with her mother.  Her husband got out of jail last year, but returned to alcohol and got his fifth DUI.  She is trying to get a divorce.  She also wishes to go school and learn something in the health field  Allergies: 1)  ! Celexa (Citalopram Hydrobromide) 2)  Darvocet-N 100  Past History:  Past medical, surgical, family and social histories (including risk factors) reviewed for relevance to current acute and chronic problems.  Past Medical History: Reviewed history from 06/23/2008 and no changes required. Depression Headache hospitalized for facial abscess MRSA tobacco abuse Anxiety childbirth x 1 chronic pain, back  Family History: Reviewed history from 02/08/2008 and no changes required. sister has pco mother has high testosterone no dm  Social History: Reviewed  history from 10/22/2008 and no changes required. 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 No apparent alopecia or balding. Eyes:  No corneal or conjunctival inflammation noted. EOMI. Perrla. Funduscopic exam benign, without hemorrhages, exudates or papilledema. Vision grossly normal. Ears:  External ear exam shows no significant lesions or deformities.  Otoscopic examination reveals clear canals, tympanic membranes are intact bilaterally without bulging, retraction, inflammation or discharge. Hearing is grossly normal bilaterally. Nose:  External nasal examination shows no deformity or inflammation. Nasal mucosa are pink and moist without lesions or exudates. Mouth:  Oral mucosa and oropharynx without lesions or exudates.  Teeth in good repair. Neck:  No deformities, masses, or tenderness noted. Chest Wall:  No deformities, masses, or tenderness noted. Lungs:  Normal respiratory effort, chest expands symmetrically. Lungs are clear to auscultation, no crackles or wheezes.   Problems:  Medical Problems Added: 1)  Dx of Viral Infection-unspec  (ICD-079.99)  Impression & Recommendations:  Problem # 1:  VIRAL INFECTION-UNSPEC (ICD-079.99) Assessment New  Her updated medication list for this problem includes:    Hydromet 5-1.5 Mg/79ml Syrp (Hydrocodone-homatropine) .Marland KitchenMarland KitchenMarland KitchenMarland Kitchen 1  or 2 tsps at bedtime as needed  Problem # 2:  BACK PAIN, LUMBAR, CHRONIC (ICD-724.2) Assessment: Unchanged  Her updated medication list for this problem includes:    Vicodin Es 7.5-750 Mg Tabs (Hydrocodone-acetaminophen) .Marland Kitchen... Take 1 tablet by mouth every morning  Complete Medication List: 1)   Chantix Continuing Month Pak 1 Mg Tabs (Varenicline tartrate) .... Uad 2)  Daily Vitamins Tabs (Multiple vitamin) .... Once daily 3)  B-12 100 Mcg Tabs (Cyanocobalamin) .... Once daily 4)  Ativan 0.5 Mg Tabs (Lorazepam) .... Take 1 tablet by mouth two times a day 5)  Ventolin Hfa 108 (90 Base) Mcg/act Aers (Albuterol sulfate) .... 2 ps qid as needed 6)  Fluoxetine Hcl 40 Mg Caps (Fluoxetine hcl) .... Take one tab two times a day 7)  Vicodin Es 7.5-750 Mg Tabs (Hydrocodone-acetaminophen) .... Take 1 tablet by mouth every morning 8)  Hydromet 5-1.5 Mg/63ml Syrp (Hydrocodone-homatropine) .Marland Kitchen.. 1 or 2 tsps at bedtime as needed  Patient Instructions: 1)  Get plenty of rest, drink lots of clear liquids, and use Tylenol or Ibuprofen for fever and comfort. Return in 7-10 days if you're not better:sooner if you're feeling worse. 2)  Take 650-1000mg  of Tylenol every 4-6 hours as needed for relief of pain or comfort of fever AVOID taking more than 4000mg   in a 24 hour period (can cause liver damage in higher doses). 3)  consume 30 ounces of water daily, he may take one or 2 teaspoons of Hydromet at bedtime as needed for nighttime cough. 4)  Start the chantix one half tablet twice a day. 5)  I will write you a prescription for 30 Vicodin and one refill until we can get you to see the back specialist Prescriptions: HYDROMET 5-1.5 MG/5ML SYRP (HYDROCODONE-HOMATROPINE) 1 or 2 tsps at bedtime as needed  #8oz x 1   Entered and Authorized by:   Roderick Pee MD   Signed by:   Roderick Pee MD on 10/01/2009   Method used:   Print then Give to Patient   RxID:   [id number] VICODIN ES 7.5-750 MG TABS (HYDROCODONE-ACETAMINOPHEN) Take 1 tablet by mouth every morning  #30 x 1   Entered and Authorized by:   Roderick Pee MD   Signed by:   Roderick Pee MD on 10/01/2009   Method used:   Print then Give to Patient   RxID:   [id number] CHANTIX CONTINUING MONTH PAK 1 MG TABS (VARENICLINE TARTRATE) UAD  #1  x 3   Entered and Authorized by:   Roderick Pee MD   Signed by:   Roderick Pee MD on 10/01/2009   Method used:   Print then Give to Patient   RxID:   [id number]

## 2010-09-28 NOTE — Progress Notes (Signed)
Summary: Pt req early refill of Hydromet Syrup  Phone Note Call from Patient Call back at [phone] Cell   or [phone] ext [phone] Work   Caller: Patient Summary of Call: Pt is req early refill of Hydromet Syrup. Refill is due on Saturday, but pt will run out before then. [facility] [address]    Initial call taken by: Lucy Antigua,  November 16, 2009 2:37 PM    Prescriptions: HYDROMET 5-1.5 MG/5ML SYRP (HYDROCODONE-HOMATROPINE) 1 or 2 tsps at bedtime as needed  #4 oz x 1   Entered by:   Duard Brady LPN   Authorized by:   Gordy Savers  MD   Signed by:   Duard Brady LPN on 44/08/270   Method used:   Historical   RxID:   [id number]  6 oz per Dr. Amador Cunas - [facility] [address]  kik  Appended Document: Pt req early refill of Hydromet Syrup after futher review of chart and discussion with rachel, suandrea and Dr. Amador Cunas - there is a contract r/t controlled substances with in her chart - this rx was re-tracted from phamracy who had placed it on hold until they heard from me. I spoke with Marcelle Smiling at [facility]. [phone]   KIK

## 2010-09-28 NOTE — Progress Notes (Signed)
Summary: wants  return call  Phone Note Call from Patient Call back at [phone]   Caller: Patient---live call Summary of Call: wants Selena Batten to return call.  Initial call taken by: Warnell Forester,  November 18, 2009 8:58 AM  Follow-up for Phone Call        attempt to call back at 3 different Numbers yesterday as noted in chart. Call forwarded to [facility] to speak with pt. KIK Follow-up by: Duard Brady LPN,  November 18, 2009 9:20 AM  Additional Follow-up for Phone Call Additional follow up Details #1::        called in the original rx. patient can pick up 4oz refill Saturday. patient is aware Additional Follow-up by: Kern Reap CMA Duncan Dull),  November 18, 2009 10:39 AM

## 2010-09-28 NOTE — Progress Notes (Signed)
Summary: hydromet- denied  Phone Note Call from Patient   Caller: Patient Reason for Call: Talk to Nurse Summary of Call: took call from pt r/t refill on hydromet - I explained that we were handling rx as soon as we could , Dr. Tawanna Cooler not here, I would have to get with Dr. Amador Cunas about the fact that the pharmacy is telling here it is too soon to fill . She states she just doesnt want to run out. I explained that i would speak with Dr. Amador Cunas but it would be later this afternoon before I could let her know anything. I also asked her to not call again , she has made multiple calls about this today and it would not speed the process any, i asked her to allow [place] to handle it , and I would let her know, and not to check with pharmacy before 4pm today.This is a patient of Dr. Higinio Plan and I will consult with Fleet Contras too.  KIK Initial call taken by: Duard Brady LPN,  November 17, 2009 1:30 PM  Follow-up for Phone Call        pt just called office again to check on status of refill - I took call - informed her that as I indicated earlier - I was trying to handle as soon as possible and i had asked her not to call , I would call her when resolved - she told me the pharmacy was waiting . I informed her I had been on the phone with them and they are also aware. I will call her before end of day. Tim Lair and Dr. Amador Cunas have been made aware. Follow-up by: Duard Brady LPN,  November 17, 2009 3:49 PM    Additional Follow-up for Phone Call Additional follow up Details #2::    I [phone] , spoke with pharmacist -asked what and who rx'd any controlled substance to pt . I was told percocet was filled 11/11/2009 written by Dr. Estell Harpin and was filled at [facility] in [place]. there were other meds but they were from last summer.  I then spoke with rachel and have been made aware of contract in chart about controlled substances - I will inform pt that Dr. Tawanna Cooler not here ,  if she runs out and needs med she will have to go to ER or wait until Monday to be seen by Dr. Tawanna Cooler.  Follow-up by: Duard Brady LPN,  November 17, 2009 3:54 PM  Additional Follow-up for Phone Call Additional follow up Details #3:: Details for Additional Follow-up Action Taken: [facility] spoke with natash and told not to fill rx and I would be calling pt. KIK I called cell # - 'vioce mail not set up , so I could not leave message , work # given was incorrect - no there by that name , called hm # - no ans no mach. KIK was going to inform no refill at this time due to contract and Dr. Joellyn Haff out of office until monday - if she runs out and needs med - will have to be seen in er . Will offer appt with Dr. Tawanna Cooler on Monday. KIK Additional Follow-up by: Duard Brady LPN,  November 17, 2009 4:45 PM

## 2010-09-28 NOTE — Letter (Signed)
Summary: [facility]  [facility]Nurse   Imported By: Maryln Gottron 10/06/2009 12:55:26  _____________________________________________________________________  External Attachment:    Type:   Image     Comment:   External Document

## 2010-09-28 NOTE — Progress Notes (Signed)
Summary: rx and referral  Phone Note Call from Patient   Caller: Patient Reason for Call: Talk to Doctor Details for Reason: rx and referral Summary of Call: patient is calling because she has had to start taking her pain med two times a day and would like an early refill called in if possible.  Also she wanted to inform us that she now has insurance and would like a referral to find out what is going on with her back. Initial call taken by: Kern Reap CMA Duncan Dull),  October 15, 2009 11:37 AM  Follow-up for Phone Call        no early refill........Marland Kitchen referred to  [facility].......she can call and make appointment Follow-up by: Roderick Pee MD,  October 15, 2009 11:56 AM  Additional Follow-up for Phone Call Additional follow up Details #1::        Phone Call Completed Additional Follow-up by: Kern Reap CMA Duncan Dull),  October 15, 2009 12:26 PM

## 2010-09-28 NOTE — Assessment & Plan Note (Signed)
Summary: fever/body aches/dm   Vital Signs:  Patient profile:   30 year old female Weight:      224 pounds Temp:     99.5 degrees F oral BP sitting:   140 / 90  (left arm) Cuff size:   regular  Vitals Entered By: Kern Reap CMA Duncan Dull) (November 09, 2009 3:52 PM)  Reason for Visit cough, body aches, fever, chills, NVD  History of Present Illness: Rebecca [name] is a 30 year old, married female, smoker, who comes in today for evaluation of fever, temperature 99.5.........Marland Kitchen body aches, nausea, head congestion, nonproductive cough, diarrhea x 3 days.  No vomiting.  She also has some red bumps on her face, consistent with impetigo.  This in the past, has developed intraocular Mrsa   Allergies: 1)  ! Celexa (Citalopram Hydrobromide) 2)  Darvocet-N 100  Past History:  Past medical, surgical, family and social histories (including risk factors) reviewed for relevance to current acute and chronic problems.  Past Medical History: Reviewed history from 06/23/2008 and no changes required. Depression Headache hospitalized for facial abscess MRSA tobacco abuse Anxiety childbirth x 1 chronic pain, back  Family History: Reviewed history from 02/08/2008 and no changes required. sister has pco mother has high testosterone no dm  Social History: Reviewed history from 10/22/2008 and no changes required. 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 No apparent alopecia or balding. Eyes:  No corneal or conjunctival inflammation noted. EOMI. Perrla. Funduscopic exam benign, without hemorrhages, exudates or papilledema. Vision grossly normal. Ears:  External ear exam shows  no significant lesions or deformities.  Otoscopic examination reveals clear canals, tympanic membranes are intact bilaterally without bulging, retraction, inflammation or discharge. Hearing is grossly normal bilaterally. Nose:  External nasal examination shows no deformity or inflammation. Nasal mucosa are pink and moist without lesions or exudates. Mouth:  Oral mucosa and oropharynx without lesions or exudates.  Teeth in good repair. Neck:  No deformities, masses, or tenderness noted. Chest Wall:  No deformities, masses, or tenderness noted. Lungs:  Normal respiratory effort, chest expands symmetrically. Lungs are clear to auscultation, no crackles or wheezes. Skin:  impetigo.  Facial   Problems:  Medical Problems Added: 1)  Dx of Diarrhea  (ICD-787.91)  Impression & Recommendations:  Problem # 1:  VIRAL URI (ICD-465.9) Assessment Deteriorated  Her updated medication list for this problem includes:    Hydromet 5-1.5 Mg/7ml Syrp (Hydrocodone-homatropine) .Marland Kitchen... 1 or 2 tsps at bedtime as needed  Orders: Prescription Created Electronically [phone])  Problem # 2:  ABSCESS, FACE (ICD-682.0) Assessment: Deteriorated  Her updated medication list for this problem includes:    Doxycycline Hyclate 100 Mg Caps (Doxycycline hyclate) .Marland Kitchen... Take 1 tablet by mouth two times a day    Septra Ds 800-160 Mg Tabs (Sulfamethoxazole-trimethoprim) .Marland Kitchen... Take 1 tablet by mouth two times a day  Orders: Prescription Created Electronically [id number])  Problem # 3:  DIARRHEA (ICD-787.91) Assessment: New  Orders: Prescription Created Electronically [phone])  Complete Medication List: 1)  Chantix Continuing Month Pak 1 Mg Tabs (Varenicline tartrate) .... Uad 2)  Daily Vitamins Tabs (Multiple vitamin) .... Once daily 3)  B-12 100 Mcg Tabs (Cyanocobalamin) .... Once daily 4)  Ativan 0.5 Mg Tabs (Lorazepam) .... Take 1 tablet by mouth  two times a day 5)  Ventolin Hfa 108 (90 Base) Mcg/act Aers (Albuterol  sulfate) .... 2 ps qid as needed 6)  Fluoxetine Hcl 40 Mg Caps (Fluoxetine hcl) .... Take one tab two times a day 7)  Vicodin Es 7.5-750 Mg Tabs (Hydrocodone-acetaminophen) .... Take 1 tablet by mouth every morning 8)  Hydromet 5-1.5 Mg/77ml Syrp (Hydrocodone-homatropine) .Marland Kitchen.. 1 or 2 tsps at bedtime as needed 9)  Doxycycline Hyclate 100 Mg Caps (Doxycycline hyclate) .... Take 1 tablet by mouth two times a day 10)  Septra Ds 800-160 Mg Tabs (Sulfamethoxazole-trimethoprim) .... Take 1 tablet by mouth two times a day  Patient Instructions: 1)  stop smoking completely and start the chantix. 2)  Stay on a clear liquid diet until the diarrhea has stopped. 3)  Start doxycycline and Septra one of each twice a day for 3 weeks. 4)  Hydromet one or 2 teaspoons at bedtime as needed for nighttime cough Prescriptions: HYDROMET 5-1.5 MG/5ML SYRP (HYDROCODONE-HOMATROPINE) 1 or 2 tsps at bedtime as needed  #4 oz x 1   Entered and Authorized by:   Roderick Pee MD   Signed by:   Roderick Pee MD on 11/09/2009   Method used:   Print then Give to Patient   RxID:   [id number] QMVHQI DS 800-160 MG TABS (SULFAMETHOXAZOLE-TRIMETHOPRIM) Take 1 tablet by mouth two times a day  #50 x 1   Entered and Authorized by:   Roderick Pee MD   Signed by:   Roderick Pee MD on 11/09/2009   Method used:   [facility]        [facility]  [place] [address] [facility]       [address] [place] [address]       [place], [place]  [address]       Ph: [phone] or [phone]       Fax: [phone]   RxID:   [id number] DOXYCYCLINE HYCLATE 100 MG CAPS (DOXYCYCLINE HYCLATE) Take 1 tablet by mouth two times a day  #50 x 1   Entered and Authorized by:   Roderick Pee MD   Signed by:   Roderick Pee MD on 11/09/2009   Method used:   [facility]ronically to        [facility]  [place] [address] [facility]ail[facility]       [address] [place] [address]       [place], [place]  [address]       Ph: [phone] or [phone]       Fax: [phone]   RxID:   [id number]

## 2010-09-28 NOTE — Progress Notes (Signed)
Summary: Pt req antibiotic [facility]  Phone Note Call from Patient Call back at [phone] home or  [phone] cell   Caller: Patient Summary of Call: Pt is req to get an antibiotic for a vaginal discharge that has foul ordor.  Please [facility].  Initial call taken by: Lucy Antigua,  September 18, 2009 11:31 AM  Follow-up for Phone Call        generic flagyl 250  #30 one TID Follow-up by: Gordy Savers  MD,  September 18, 2009 12:40 PM    New/Updated Medications: FLAGYL 250 MG TABS (METRONIDAZOLE) take one tab three times a day Prescriptions: FLAGYL 250 MG TABS (METRONIDAZOLE) take one tab three times a day  #30 x 0   Entered by:   Kern Reap CMA (AAMA)   Authorized by:   Roderick Pee MD   Signed by:   Kern Reap CMA (AAMA) on 09/18/2009   Method used:   Electronically to        [facility]  [place] [address] (retail)       [address] [place] [address]       [place], [place]  [address]       Ph: [phone] or [phone]       Fax: [phone]   RxID:   [id number]

## 2010-09-28 NOTE — Miscellaneous (Signed)
Summary: Controlled Substance Agreement  Controlled Substance Agreement   Imported By: Maryln Gottron 10/07/2009 09:42:19  _____________________________________________________________________  External Attachment:    Type:   Image     Comment:   External Document

## 2010-10-08 ENCOUNTER — Emergency Department (HOSPITAL_COMMUNITY)
Admission: EM | Admit: 2010-10-08 | Discharge: 2010-10-08 | Disposition: A | Discharge status: Home / Self Care | Payer: Self-pay | Attending: Emergency Medicine | Admitting: Emergency Medicine

## 2010-10-08 DIAGNOSIS — Z8614 Personal history of Methicillin resistant Staphylococcus aureus infection: Secondary | ICD-10-CM | POA: Insufficient documentation

## 2010-10-08 DIAGNOSIS — K089 Disorder of teeth and supporting structures, unspecified: Secondary | ICD-10-CM | POA: Insufficient documentation

## 2010-11-15 ENCOUNTER — Emergency Department (HOSPITAL_COMMUNITY)
Admission: EM | Admit: 2010-11-15 | Discharge: 2010-11-15 | Disposition: A | Discharge status: Home / Self Care | Payer: Self-pay | Attending: Emergency Medicine | Admitting: Emergency Medicine

## 2010-11-15 DIAGNOSIS — R51 Headache: Secondary | ICD-10-CM | POA: Insufficient documentation

## 2010-11-15 DIAGNOSIS — K089 Disorder of teeth and supporting structures, unspecified: Secondary | ICD-10-CM | POA: Insufficient documentation

## 2010-11-15 DIAGNOSIS — R110 Nausea: Secondary | ICD-10-CM | POA: Insufficient documentation

## 2010-11-15 LAB — POCT PREGNANCY, URINE: Preg Test, Ur: NEGATIVE

## 2011-01-11 NOTE — Assessment & Plan Note (Signed)
[facility]                                 ON-CALL NOTE   ZERLINE, MELCHIOR                       MRN:          [id number]  DATE:08/29/2007                            DOB:          August 10, 1981    Date of interaction August 29, 2007, at 9:11 p.m.  Phone number is [phone].   SUBJECTIVE:  The patient has a sore throat and has been exposed to  strep.  The right side of her pharynx hurts and has a white spot.  She  has had family exposure as well.  Tells me that she has had antibiotics  called in for her before when she has complained with pharyngeal pain  but I am unwilling to do that.  I told her that she has 10 days to treat  strep if it is indeed strep and suggested that she gargle with salt  water aggressively to get rid of the pain and then if the pain continues  by Friday then go in and be seen and she can have a rapid strep done and  possibly get antibiotics, which will still treat the strep  appropriately.   Primary care Lorrie Gargan is Dr. Tawanna Cooler and home office is [facility].     Arta Silence, MD  Electronically Signed    RNS/MedQ  DD: 08/30/2007  DT: 08/30/2007  Job #: [id number]

## 2011-01-11 NOTE — Assessment & Plan Note (Signed)
[facility]                                 ON-CALL NOTE   PENINA, REISNER                       MRN:          [id number]  DATE:07/27/2007                            DOB:          10/21/1980    TIME:  5:13 p.m.   PHONE NUMBER:  [phone].   REGULAR DOCTOR:  Eugenio Hoes. Tawanna Cooler, M.D.   Dr. Cato Mulligan saw her today.  The patient said she saw Dr. Cato Mulligan and was  prescribed pain medicine for foot pain, possibly plantar fascitis today.  He prescribed her Voltaren.  She said she got the patient insert from  the drug at the pharmacy.  It said not to take it if you have GI  bleeding, if you are on antidepressants, or if you are a smoker.  She  said she is a smoker and is on antidepressants.  She said she is very  worried about the possibility of GI bleeding because she is also on  prednisone.  She had taken 3 Aleve previously with no help and needs  something stronger and she wants Vicodin called in.  I reviewed her  partially done note from Dr. Cato Mulligan on EMR.  It said that she has had  over use issues of Vicodin in the past and he would not give it to her.  In addition to that the chart says she is allergic to Darvocet.  I told  her that with the allergy to Darvocet, the non-prescribing status of  Vicodin, that I could not call her in any other pain medication tonight.  She was upset and said she would look for another practice at that time.  I did tell her to call back if she had increased pain or any other  symptoms or to go to the emergency room if she needs urgent evaluation.     Marne A. Tower, MD  Electronically Signed    MAT/MedQ  DD: 07/27/2007  DT: 07/27/2007  Job #: [id number]   cc:   Tinnie Gens A. Tawanna Cooler, MD  Valetta Mole. Swords, MD

## 2011-01-14 NOTE — Consult Note (Signed)
Rebecca [name], Rebecca [name]                          ACCOUNT NO.:  [id number]   MEDICAL RECORD NO.:  [id number]                   PATIENT TYPE:  INP   LOCATION:  3732                                 FACILITY:  [facility]   PHYSICIAN:  Jefry H. Pollyann Kennedy, M.D.                DATE OF BIRTH:  12-26-1980   DATE OF CONSULTATION:  05/01/2004  DATE OF DISCHARGE:                                   CONSULTATION   REASON FOR CONSULTATION:  Facial cellulitis and abscess.   HISTORY:  This is a 30 year old lady who was admitted to the hospital two  days ago with facial cellulitis.  Her history is significant for about 10  days ago she had an abscess under her right arm that was lanced in the  emergency department.  She was treated with antibiotics and this had gotten  better completely.  She was admitted to the hospital and infectious disease  was consulted.  The initial attempt at outpatient doxycycline failed.  There  is what I believe is suspected methicillin-resistant Staphylococcus.   PAST MEDICAL AND SURGICAL HISTORY:  Noncontributory.   PHYSICAL EXAMINATION:  GENERAL:  She is a healthy-appearing, overweight lady  in no distress.  HEENT:  Oral cavity and pharynx are clear, but there is some fullness in the  left upper gingivolabial sulcus.  The most remarkable finding is the severe  swelling, induration, and erythema of the left maxillary skin.  There is  swelling of the left eyelid as well with partial closure of the left eye.  The left eye, however, has good extraocular muscle function and good visual  acuity.  Nasal exam is clear.  NECK:  No other neck masses palpable.   IMPRESSION:  Left facial cellulitis, probable abscess.   Recommend incision and drainage.   PROCEDURE NOTE:  Using sterile technique, the left face was infiltrated with  Xylocaine with epinephrine and an attempt was made to perform infraorbital  nerve block on the left but this was of minimal success because of the  severe  inflammatory response.  The face was then draped and a #11 scalpel  was used to incise about 2 cm parallel to the infraorbital rim about 1.5 cm  below the infraorbital rim.  Sharp dissection through subcutaneous tissue  was performed and blunt dissection with a hemostat was used to divide  tissues into the indurated area, and a small amount of purulent material was  obtained and sent for culture, aerobic and anaerobic.  No further fluctuance  or purulence was identified.  The wound was probed as thoroughly as  possible.  She had significant amount of pain with this.  A quarter-inch  Penrose drain was placed into the depths of the wound and secured in place  with a silk suture.  A sterile dressing was applied with tape.   IMPRESSION:  Status post incision and drainage of left facial abscess.  We  will watch over the next couple of days.  Hopefully, this will start to  resolve with the proper antibiotic coverage and with proper drainage.                                               Jefry H. Pollyann Kennedy, M.D.    JHR/MEDQ  D:  05/01/2004  T:  05/03/2004  Job:  [id number]

## 2011-01-14 NOTE — Assessment & Plan Note (Signed)
[facility]                                 ON-CALL NOTE   Rebecca [name], Rebecca [name]                     MRN:          [id number]  DATE:07/29/2007                            DOB:          1981-04-16    PHONE NUMBER:  [phone]   CHIEF COMPLAINT:  Withdrawal.   The patient says she is getting off of being on Vicodin for a while.  She is going through some withdrawal symptoms.  She is having a lot of  aches and pains especially in her legs.  She is nervous and shaky.  She  said she stopped the drug cold [place] several days ago.  She wanted to  know if there was anything over-the-counter that would help.  I told her  that a dose of Benadryl may or may not help sleep and that Tylenol or  Advil may help some of the aches and pains and to keep good fluid intake  up and to keep folks around her to keep her calm.  I told her that if  she develops more severe symptoms to go to the emergency room for  evaluation.  Otherwise she will call Dr. Nelida Meuse office in the morning to  get an appointment.     Marne A. Tower, MD  Electronically Signed    MAT/MedQ  DD: 07/29/2007  DT: 07/29/2007  Job #: [id number]   cc:   Tinnie Gens A. Tawanna Cooler, MD

## 2011-01-14 NOTE — Discharge Summary (Signed)
NAMESHERIANN, NEWMANN                          ACCOUNT NO.:  [id number]   MEDICAL RECORD NO.:  [id number]                   PATIENT TYPE:  INP   LOCATION:  3732                                 FACILITY:  [facility]   PHYSICIAN:  Rene Paci, M.D. [facility]          DATE OF BIRTH:  [birth year]   DATE OF ADMISSION:  04/29/2004  DATE OF DISCHARGE:  05/04/2004                                 DISCHARGE SUMMARY   DISCHARGE DIAGNOSIS:  Community-acquired methicillin-resistant  Staphylococcus aureus soft tissue abscess.   BRIEF ADMISSION HISTORY:  [name] is a 30 year old white female with a  history of a soft tissue abscess of the right axilla.  This was I&D about  ten days prior to this admission and she was treated with Keflex.  Symptoms  had diminished until one day prior to admission when she developed left  facial swelling.  She was seen in the emergency department where she was  told she had MRSA and was given a prescription for doxycycline.  She  presented with worsening symptoms including swelling of her left eye.   HOSPITAL COURSE:  Problem 1. Infectious disease.  The patient presented with  left facial cellulitis.  CT of the sinuses was consistent with soft tissue  abscess.  Cultures were positive for MRSA which was sensitive to Septra.  The patient was seen by infectious disease who confirmed that this was  recurrent community-acquired MRSA abscess.  The patient empirically had been  started on vancomycin and Zosyn, and it was felt the vancomycin could be  discontinued, and she could continue a 10-day course of Septra.  The patient  was also seen in consultation by ear, nose and throat.  Dr. Pollyann Kennedy did I&D  the wound on 05/01/04 and placed a drain.  The drain came out during the night  on 05/03/04.  The patient has been reluctant to be discharged home.  She was  often tearful.  She is concerned that her symptoms are going to worsen again  at home.  I did discuss this with Dr. Pollyann Kennedy on  05/04/04.  He did not feel that  there was any indication for further I&D at this time, but stated he would  like to see the patient in the office in the next one to two days.  He did  not feel there was any reason for him to follow up with the patient in the  hospital.  I also talked with Dr. Orvan Falconer from infectious disease who  agreed with the current regimen.  He also stated he would be glad to follow  up with the patient next week if close followup would give her some  reassurance.  We did also go over with her the need to use antibacterial  soaps for the whole family.  It would also be beneficial to launder all  clothes, bedsheets and towels in hot water.  This is because there are other  family  members at home that have MRSA.  I also attempted to contact Dr. Tawanna Cooler  who is out of the office until Wednesday, 9/7; however, we will discharge  the patient home with close followup with Dr. Tawanna Cooler.   DISCHARGE MEDICATIONS:  1.  Septra DS 1 p.o. b.i.d. for 10 days.  2.  Vicodin 7.5/750, 1-2 tabs p.o. q.4-6h. p.r.n.   FOLLOWUP:  Follow up with Dr. Pollyann Kennedy in 1-2 days.  Follow up with Dr. Tawanna Cooler on  9/7 at 10:15 a.m. and can also follow up with Dr. Cliffton Asters in the next  7-10 days as well.      Cornell Barman, P.A. [facility]                  Rene Paci, M.D. [facility]    LC/MEDQ  D:  05/04/2004  T:  05/04/2004  Job:  [id number]   cc:   Cliffton Asters, M.D.  [address] [place]  [place] [address]  Fax: [phone]   Jeannett Senior. Pollyann Kennedy, M.D.  [address] [place]  [place] [address]  Fax: [phone]   Eugenio Hoes. Tawanna Cooler, M.D. [facility]

## 2011-01-14 NOTE — H&P (Signed)
NAMEMIRACLE, MONGILLO                          ACCOUNT NO.:  [id number]   MEDICAL RECORD NO.:  [id number]                   PATIENT TYPE:  INP   LOCATION:  1827                                 FACILITY:  [facility]   PHYSICIAN:  Tinnie Gens A. Tawanna Cooler, M.D. [facility]           DATE OF BIRTH:  02-27-1981   DATE OF ADMISSION:  04/29/2004  DATE OF DISCHARGE:                                HISTORY & PHYSICAL   REASON FOR ADMISSION:  [name] is a 30 year old married white female  gravida 1, para 1, AB 0 (she has a 97-year-old son) who comes into the office  today for evaluation of pain and swelling of the left side of her face.   The patient states she was well until 8 day ago.  She went to [facility]  Emergency Room because she had a boil on her right axillary area.  She said  the boil was drained and she was given Keflex.  Cultures were not done.  She  did well until yesterday and she noticed a little bump on her left side of  her face.  Throughout the day it became worse and the whole left side of her  face began to swell.  She went to the emergency room at [facility] and was  told she probably had MRSA, was given doxycycline 100 mg b.i.d.  She started  her antibiotic immediately; however, now her left eye upper and lower lids  are swollen and she is having difficulty seeing because of the swelling.  When the lids are parted, she says her vision is normal.  She can see in all  4 quadrants.   She states she lives with her family and one family member has been  diagnosed to have MRSA.  She has never had infections like this in the past.   PAST MEDICAL HISTORY:  She is past a live childbirth x40, a 31-year-old son   PAST ILLNESSES:  None.   INJURIES:  None.   DRUG ALLERGIES:  None.   Does not smoke or drink any alcohol or use any drugs.   CURRENT MEDICATIONS:  None.  She is not using anything for birth control.  Last period was 3 weeks ago and it was normal.  A pregnancy test will be  done when  she is admitted; however, I explained to her that even if she is  pregnant the drugs we are using for the infection are toxic but we would  have to treat her and she would need to have a therapeutic abortion because  of the toxicity of the drugs on a 31-day-old fetus.  She understands and  accepts that risk.   REVIEW OF SYSTEMS:  Negative.   SOCIAL HISTORY:  She is unemployed.  She currently is a full-time mom, one 31-  year-old son   FAMILY HISTORY:  Dad is 88 and has had 2 MI's, hypertension, smoker, and  hyperlipidemia.  Mother is 90 and has had cervical cancer, uterus removed.  Two brothers, two sisters, all four in good health except her one sister has  a seizure disorder.   VACCINATIONS:  She had her last tetanus 1999.   PHYSICAL EVALUATION:  VITAL SIGNS:  Temp was 98.6, pulse 80 and regular,  respirations 12 and regular, BP 122/80, weight 196-1/2 pounds.  GENERAL:  She is a well-developed, well-nourished white female with  obviously marked facial swelling and erythema to the left side of her face  and also both the upper and lower eyelids were swollen.  HEAD, EYES, EARS, NOSE AND THROAT:  Paying particular attention to that left  eye, the pupil is equal, reactive to light and accommodation.  Extraocular  motions are intact and her vision was normal in that eye.  The rest of the  head and neck exam was negative.  CARDIOPULMONARY:  Negative.  ABDOMEN:  Negative.  EXTREMITIES:  Normal, skin normal, peripheral pulse is normal.  There is a  scar on the right axillary where she had a boil I&D'd 8 days ago.  It is  healing well.   IMPRESSION:  Facial and orbital cellulitis.   PLAN:  Will start her on IV vancomycin after pancultures.  We have explained  the potential toxicity of the IV antibiotics.  Also explained the potential  toxicity of letting this infection go which would be blindness in that left  eye.  The patient understands the severity of the problem.  Her  grandfather  will take her to the hospital to be admitted STAT.                                                Jeffrey A. Tawanna Cooler, M.D. [facility]    JAT/MEDQ  D:  04/29/2004  T:  04/29/2004  Job:  [id number]
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
## 2011-01-25 ENCOUNTER — Emergency Department (HOSPITAL_COMMUNITY)
Admission: EM | Admit: 2011-01-25 | Discharge: 2011-01-25 | Disposition: A | Discharge status: Home / Self Care | Payer: Self-pay | Attending: Emergency Medicine | Admitting: Emergency Medicine

## 2011-01-25 DIAGNOSIS — F17200 Nicotine dependence, unspecified, uncomplicated: Secondary | ICD-10-CM | POA: Insufficient documentation

## 2011-01-25 DIAGNOSIS — K089 Disorder of teeth and supporting structures, unspecified: Secondary | ICD-10-CM | POA: Insufficient documentation

## 2011-01-25 DIAGNOSIS — Z8614 Personal history of Methicillin resistant Staphylococcus aureus infection: Secondary | ICD-10-CM | POA: Insufficient documentation

## 2011-02-18 ENCOUNTER — Emergency Department (HOSPITAL_COMMUNITY)
Admission: EM | Admit: 2011-02-18 | Discharge: 2011-02-18 | Disposition: A | Discharge status: Home / Self Care | Payer: Self-pay | Attending: Emergency Medicine | Admitting: Emergency Medicine

## 2011-02-18 ENCOUNTER — Emergency Department (HOSPITAL_COMMUNITY): Payer: Self-pay

## 2011-02-18 DIAGNOSIS — N12 Tubulo-interstitial nephritis, not specified as acute or chronic: Secondary | ICD-10-CM | POA: Insufficient documentation

## 2011-02-18 LAB — URINALYSIS, ROUTINE W REFLEX MICROSCOPIC
Ketones, ur: NEGATIVE mg/dL
Nitrite: NEGATIVE
pH: 8 (ref 5.0–8.0)

## 2011-02-18 LAB — DIFFERENTIAL
Basophils Relative: 0 % (ref 0–1)
Eosinophils Absolute: 0.1 10*3/uL (ref 0.0–0.7)
Eosinophils Relative: 1 % (ref 0–5)
Lymphs Abs: 1.7 10*3/uL (ref 0.7–4.0)
Monocytes Relative: 8 % (ref 3–12)

## 2011-02-18 LAB — URINE MICROSCOPIC-ADD ON

## 2011-02-18 LAB — CBC
MCH: 29.8 pg (ref 26.0–34.0)
MCV: 91.1 fL (ref 78.0–100.0)
Platelets: 203 10*3/uL (ref 150–400)
RDW: 12.9 % (ref 11.5–15.5)

## 2011-02-18 LAB — WET PREP, GENITAL: Trich, Wet Prep: NONE SEEN

## 2011-02-18 LAB — COMPREHENSIVE METABOLIC PANEL
AST: 14 U/L (ref 0–37)
Albumin: 3.9 g/dL (ref 3.5–5.2)
Calcium: 9 mg/dL (ref 8.4–10.5)
Creatinine, Ser: 0.78 mg/dL (ref 0.50–1.10)
Total Protein: 7.7 g/dL (ref 6.0–8.3)

## 2011-02-18 LAB — POCT PREGNANCY, URINE: Preg Test, Ur: NEGATIVE

## 2011-02-19 LAB — GC/CHLAMYDIA PROBE AMP, GENITAL
Chlamydia, DNA Probe: NEGATIVE
GC Probe Amp, Genital: NEGATIVE

## 2011-02-22 LAB — URINE CULTURE

## 2011-03-13 ENCOUNTER — Emergency Department (HOSPITAL_COMMUNITY)
Admission: EM | Admit: 2011-03-13 | Discharge: 2011-03-13 | Disposition: A | Discharge status: Home / Self Care | Payer: Self-pay | Attending: Emergency Medicine | Admitting: Emergency Medicine

## 2011-03-13 DIAGNOSIS — N390 Urinary tract infection, site not specified: Secondary | ICD-10-CM | POA: Insufficient documentation

## 2011-03-13 DIAGNOSIS — R110 Nausea: Secondary | ICD-10-CM | POA: Insufficient documentation

## 2011-03-13 DIAGNOSIS — R109 Unspecified abdominal pain: Secondary | ICD-10-CM | POA: Insufficient documentation

## 2011-03-13 DIAGNOSIS — F17200 Nicotine dependence, unspecified, uncomplicated: Secondary | ICD-10-CM | POA: Insufficient documentation

## 2011-03-13 HISTORY — DX: Calculus of kidney: N20.0

## 2011-03-13 LAB — URINALYSIS, ROUTINE W REFLEX MICROSCOPIC
Glucose, UA: NEGATIVE mg/dL
Hgb urine dipstick: NEGATIVE
Protein, ur: NEGATIVE mg/dL
Specific Gravity, Urine: 1.025 (ref 1.005–1.030)
Urobilinogen, UA: 0.2 mg/dL (ref 0.0–1.0)

## 2011-03-13 LAB — URINE MICROSCOPIC-ADD ON

## 2011-03-13 MED ORDER — OXYCODONE-ACETAMINOPHEN 5-325 MG PO TABS: 2.0000 | ORAL_TABLET | ORAL | Status: DC | PRN

## 2011-03-13 MED ORDER — CEPHALEXIN 500 MG PO CAPS: 500.0000 mg | ORAL_CAPSULE | Freq: Four times a day (QID) | ORAL | Status: AC

## 2011-03-13 MED ORDER — OXYCODONE-ACETAMINOPHEN 5-325 MG PO TABS
1.0000 | ORAL_TABLET | Freq: Once | ORAL | Status: AC
  Administered 2011-03-13: 1 via ORAL
  Filled 2011-03-13: qty 1

## 2011-03-13 NOTE — ED Notes (Signed)
Patient with no complaints at this time. Respirations even and unlabored. Skin warm/dry. Discharge instructions reviewed with patient at this time. Patient given opportunity to voice concerns/ask questions. Patient discharged at this time and left Emergency Department with steady gait.   

## 2011-03-13 NOTE — ED Provider Notes (Addendum)
History      Patient is a 30 y.o. female presenting with abdominal pain. The history is provided by the patient. No language interpreter was used.  Abdominal Pain The primary symptoms of the illness include abdominal pain and nausea. The primary symptoms of the illness do not include fever, fatigue, shortness of breath, vomiting, diarrhea or vaginal discharge. Episode onset: about 2 weeks ago. The problem has been gradually worsening.  The abdominal pain radiates to the LLQ.  The illness is associated with recent antibiotic use. The patient states that she believes she is currently not pregnant. The patient has not had a change in bowel habit. Symptoms associated with the illness do not include chills, anorexia, diaphoresis, heartburn, constipation, urgency, hematuria or back pain. Significant associated medical issues include cardiac disease. Significant associated medical issues do not include PUD, inflammatory bowel disease, diabetes, diverticulitis or HIV.      History reviewed. No pertinent past surgical history.  History reviewed. No pertinent family history.  History  Substance Use Topics  . Smoking status: Current Everyday Smoker -- 0.5 packs/day    Types: Cigarettes  . Smokeless tobacco: Not on file  . Alcohol Use: No      Review of Systems  Constitutional: Negative for fever, chills, diaphoresis and fatigue.  Respiratory: Negative for shortness of breath.   Gastrointestinal: Positive for nausea and abdominal pain. Negative for heartburn, vomiting, diarrhea, constipation and anorexia.  Genitourinary: Negative for urgency, hematuria and vaginal discharge.  Musculoskeletal: Negative for back pain.  All other systems reviewed and are negative.    Physical Exam  BP 145/95  Pulse 97  Temp(Src) 98.9 F (37.2 C) (Oral)  Resp 20  Ht 5\' 8"  (1.727 m)  Wt 183 lb (83.008 kg)  BMI 27.82 kg/m2  SpO2 98%  LMP 02/23/2011  Physical Exam  Constitutional: Vital signs are  normal. She appears well-developed and well-nourished.  HENT:  Head: Normocephalic and atraumatic.  Right Ear: Hearing normal.  Left Ear: Hearing normal.  Nose: Nose normal.  Mouth/Throat: Uvula is midline and mucous membranes are normal.  Eyes: Conjunctivae and EOM are normal. Pupils are equal, round, and reactive to light. Right eye exhibits no discharge and no exudate. Left eye exhibits no discharge and no exudate.  Neck: Phonation normal.  Cardiovascular: Normal rate, regular rhythm and normal heart sounds.   Pulmonary/Chest: Effort normal and breath sounds normal.  Abdominal: Normal appearance. There is no splenomegaly or hepatomegaly. There is tenderness in the left lower quadrant. There is no rigidity, no guarding, no CVA tenderness, no tenderness at McBurney's point and negative Murphy's sign. No hernia.       Tenderness is mild  Musculoskeletal: Normal range of motion.       Cervical back: Normal.       Thoracic back: Normal.       Lumbar back: Normal.  Neurological: She is alert. She has normal strength. No cranial nerve deficit or sensory deficit.  Skin: Skin is warm and intact.  Psychiatric: She has a normal mood and affect. Her speech is normal and behavior is normal. Judgment and thought content normal.    ED Course  Procedures MDM Reevaluation with update and discussion. After initial assessment and treatment, an updated evaluation at this time reveals pt more comfortable after Percocet given.  Eval. C/w untreated UTI; apparently not responsive to Cipro. Doubt ureter stone, and there is no good hx for kidney stones in the past. Pt is improved in the ED, and is stable  for d/c. UC ordered. Doubt SBI, pyelonephritis, Colitis, PID, or metabolic instability.      Flint Melter, MD 03/13/11 1551  Flint Melter, MD 03/13/11 [phone]

## 2011-03-13 NOTE — ED Notes (Signed)
Pt presents with Left sided abd, flank, and back pain. Pt seen here for kidney stones and pain has never stopped. Pt also c/o nausea. Pt tearful in triage.

## 2011-03-15 LAB — URINE CULTURE

## 2011-03-17 ENCOUNTER — Emergency Department (HOSPITAL_COMMUNITY)
Admission: EM | Admit: 2011-03-17 | Discharge: 2011-03-17 | Discharge status: Against Medical Advice | Payer: Self-pay | Attending: Emergency Medicine | Admitting: Emergency Medicine

## 2011-03-17 ENCOUNTER — Encounter (HOSPITAL_COMMUNITY): Payer: Self-pay | Admitting: Emergency Medicine

## 2011-03-17 DIAGNOSIS — R109 Unspecified abdominal pain: Secondary | ICD-10-CM | POA: Insufficient documentation

## 2011-03-17 DIAGNOSIS — M549 Dorsalgia, unspecified: Secondary | ICD-10-CM | POA: Insufficient documentation

## 2011-03-17 LAB — COMPREHENSIVE METABOLIC PANEL
ALT: 8 U/L (ref 0–35)
AST: 11 U/L (ref 0–37)
Albumin: 3.9 g/dL (ref 3.5–5.2)
CO2: 25 mEq/L (ref 19–32)
Chloride: 105 mEq/L (ref 96–112)
Creatinine, Ser: 0.76 mg/dL (ref 0.50–1.10)
Potassium: 3.7 mEq/L (ref 3.5–5.1)
Sodium: 139 mEq/L (ref 135–145)
Total Bilirubin: 0.2 mg/dL — ABNORMAL LOW (ref 0.3–1.2)

## 2011-03-17 LAB — URINALYSIS, ROUTINE W REFLEX MICROSCOPIC
Leukocytes, UA: NEGATIVE
Nitrite: NEGATIVE
Protein, ur: NEGATIVE mg/dL
Specific Gravity, Urine: >1.03 — ABNORMAL HIGH (ref 1.005–1.030)
Urobilinogen, UA: 0.2 mg/dL (ref 0.0–1.0)

## 2011-03-17 LAB — DIFFERENTIAL
Basophils Absolute: 0 10*3/uL (ref 0.0–0.1)
Basophils Relative: 0 % (ref 0–1)
Lymphocytes Relative: 41 % (ref 12–46)
Monocytes Absolute: 0.6 10*3/uL (ref 0.1–1.0)
Neutro Abs: 5.6 10*3/uL (ref 1.7–7.7)
Neutrophils Relative %: 49 % (ref 43–77)

## 2011-03-17 LAB — PREGNANCY, URINE: Preg Test, Ur: NEGATIVE

## 2011-03-17 LAB — CBC
MCHC: 33.3 g/dL (ref 30.0–36.0)
Platelets: 210 10*3/uL (ref 150–400)
RDW: 13.7 % (ref 11.5–15.5)
WBC: 11.6 10*3/uL — ABNORMAL HIGH (ref 4.0–10.5)

## 2011-03-17 MED ORDER — IBUPROFEN 200 MG PO TABS: 200.0000 mg | ORAL_TABLET | Freq: Four times a day (QID) | ORAL | Status: DC | PRN

## 2011-03-17 MED ORDER — OXYCODONE-ACETAMINOPHEN 5-325 MG PO TABS
2.0000 | ORAL_TABLET | Freq: Once | ORAL | Status: AC
  Administered 2011-03-17: 2 via ORAL
  Filled 2011-03-17: qty 2

## 2011-03-17 MED ORDER — OXYCODONE-ACETAMINOPHEN 5-325 MG PO TABS: 2.0000 | ORAL_TABLET | ORAL | Status: AC | PRN

## 2011-03-17 NOTE — ED Provider Notes (Addendum)
History     Chief Complaint  Patient presents with  . Abdominal Pain  . Back Pain   Patient is a 30 y.o. female presenting with abdominal pain. The history is provided by the patient. No language interpreter was used.  Abdominal Pain The primary symptoms of the illness include abdominal pain and dysuria. The primary symptoms of the illness do not include shortness of breath, nausea or vomiting. Episode onset: 1 month ago. The onset of the illness was sudden. The problem has not changed since onset. The abdominal pain is located in the LLQ. The abdominal pain radiates to the left flank. Relieved by: moderate relief from Oxycodone. Exacerbated by: palpation.  The dysuria is not associated with hematuria.  The patient states that she believes she is currently not pregnant. Symptoms associated with the illness do not include chills, diaphoresis or hematuria. Associated medical issues comments: kidney stone.  C/o lower abdominal pain which radiates to left flank onset 1 month ago and persistent since with associated dysuria. Patient reports she was evaluated 1 month ago for same symptoms and was told she had a kidney stone. Patient also reports being seen in ED 4 days ago had an abdominal CT with contrast, blood work, urinalysis performed, was dx with pyelonephritis, given Keflex for treatment and referred to a urologist. Notes she was given a prescription for Oxycodone as well 4 days ago and has run out. Patient states she has been trying to spread out pain medications in order for them to last. Denies chest pain, SOB, n/v.  Patient seen at 8:44 PM  Past Medical History  Diagnosis Date  . Kidney stone     History reviewed. No pertinent past surgical history.  History reviewed. No pertinent family history.  History  Substance Use Topics  . Smoking status: Current Everyday Smoker -- 0.5 packs/day    Types: Cigarettes  . Smokeless tobacco: Not on file  . Alcohol Use: No    OB History    Grav Para Term Preterm Abortions TAB SAB Ect Mult Living   1 1              Review of Systems  Constitutional: Negative for chills and diaphoresis.  Respiratory: Negative for shortness of breath.   Cardiovascular: Negative for chest pain.  Gastrointestinal: Positive for abdominal pain. Negative for nausea and vomiting.  Genitourinary: Positive for dysuria and flank pain. Negative for hematuria.  All other systems reviewed and are negative.  All other systems negative except as noted in HPI.   Physical Exam  BP 135/98  Pulse 68  Temp(Src) 98.1 F (36.7 C) (Oral)  Resp 16  Ht 5\' 8"  (1.727 m)  Wt 180 lb (81.647 kg)  BMI 27.37 kg/m2  SpO2 100%  LMP 02/23/2011  Physical Exam  Nursing note and vitals reviewed. Constitutional: She is oriented to person, place, and time. She appears well-developed and well-nourished. No distress.       Hypertensive.   HENT:  Head: Normocephalic and atraumatic.  Eyes: Conjunctivae are normal. No scleral icterus.  Neck: Neck supple.  Cardiovascular: Normal rate, regular rhythm and normal pulses.  Exam reveals no gallop and no friction rub.   No murmur heard. Pulmonary/Chest: Effort normal and breath sounds normal. She has no wheezes.  Abdominal: Soft. Bowel sounds are normal. She exhibits no distension. There is no tenderness.  Musculoskeletal: Normal range of motion. She exhibits no edema.  Neurological: She is alert and oriented to person, place, and time. No sensory deficit.  Skin: Skin is warm and dry.  Psychiatric: She has a normal mood and affect. Her behavior is normal.    ED Course  Procedures  MDM   8:48 PM - Physician reviewed prior ED visits.  Results for orders placed during the hospital encounter of 03/17/11  URINALYSIS, ROUTINE W REFLEX MICROSCOPIC      Component Value Range   Color, Urine YELLOW  YELLOW    Appearance HAZY (*) CLEAR    Specific Gravity, Urine >1.030 (*) 1.005 - 1.030    pH 5.5  5.0 - 8.0    Glucose, UA  NEGATIVE  NEGATIVE (mg/dL)   Hgb urine dipstick NEGATIVE  NEGATIVE    Bilirubin Urine NEGATIVE  NEGATIVE    Ketones, ur NEGATIVE  NEGATIVE (mg/dL)   Protein, ur NEGATIVE  NEGATIVE (mg/dL)   Urobilinogen, UA 0.2  0.0 - 1.0 (mg/dL)   Nitrite NEGATIVE  NEGATIVE    Leukocytes, UA NEGATIVE  NEGATIVE   PREGNANCY, URINE      Component Value Range   Preg Test, Ur NEGATIVE    CBC      Component Value Range   WBC 11.6 (*) 4.0 - 10.5 (K/uL)   RBC 4.14  3.87 - 5.11 (MIL/uL)   Hemoglobin 12.5  12.0 - 15.0 (g/dL)   HCT 16.1  09.6 - 04.5 (%)   MCV 90.6  78.0 - 100.0 (fL)   MCH 30.2  26.0 - 34.0 (pg)   MCHC 33.3  30.0 - 36.0 (g/dL)   RDW 40.9  81.1 - 91.4 (%)   Platelets 210  150 - 400 (K/uL)  DIFFERENTIAL      Component Value Range   Neutrophils Relative 49  43 - 77 (%)   Neutro Abs 5.6  1.7 - 7.7 (K/uL)   Lymphocytes Relative 41  12 - 46 (%)   Lymphs Abs 4.8 (*) 0.7 - 4.0 (K/uL)   Monocytes Relative 5  3 - 12 (%)   Monocytes Absolute 0.6  0.1 - 1.0 (K/uL)   Eosinophils Relative 5  0 - 5 (%)   Eosinophils Absolute 0.5  0.0 - 0.7 (K/uL)   Basophils Relative 0  0 - 1 (%)   Basophils Absolute 0.0  0.0 - 0.1 (K/uL)  COMPREHENSIVE METABOLIC PANEL      Component Value Range   Sodium 139  135 - 145 (mEq/L)   Potassium 3.7  3.5 - 5.1 (mEq/L)   Chloride 105  96 - 112 (mEq/L)   CO2 25  19 - 32 (mEq/L)   Glucose, Bld 91  70 - 99 (mg/dL)   BUN 8  6 - 23 (mg/dL)   Creatinine, Ser 7.82  0.50 - 1.10 (mg/dL)   Calcium 8.9  8.4 - 95.6 (mg/dL)   Total Protein 7.5  6.0 - 8.3 (g/dL)   Albumin 3.9  3.5 - 5.2 (g/dL)   AST 11  0 - 37 (U/L)   ALT 8  0 - 35 (U/L)   Alkaline Phosphatase 76  39 - 117 (U/L)   Total Bilirubin 0.2 (*) 0.3 - 1.2 (mg/dL)   GFR calc non Af Amer >60  >60 (mL/min)   GFR calc Af Amer >60  >60 (mL/min)     Chart written by Clarita Crane acting as scribe for Nelia Shi, MD  I personally performed the services described in this documentation, which was scribed in my presence.  The recorded information has been reviewed and considered.    Nelia Shi, MD 03/23/11 [phone]  Nelia Shi, MD 04/07/11 2229

## 2011-03-17 NOTE — ED Notes (Signed)
Patient is comfortable wanted to know how long till the doctor would be in to see her. I told her that right now I was unaware of how long till the doctor would be in but would check and see. Patient asked if she decided to leave what she would need to do before leaving. I stated to patient to just let me know before leave. RN Boyd Kerbs aware of patients questions and concerns.

## 2011-03-17 NOTE — ED Notes (Signed)
Pt c/o left lower abd pain and lower back pain x 1 month. Pt states she was seen here for the same and dx with kidney stones and uti.

## 2011-03-20 LAB — URINE CULTURE

## 2011-03-22 NOTE — ED Notes (Signed)
+   Urine Patient treated with Keflex-sensitive to same-chart appended per protocol MD. 

## 2011-03-26 ENCOUNTER — Emergency Department (HOSPITAL_BASED_OUTPATIENT_CLINIC_OR_DEPARTMENT_OTHER)
Admission: EM | Admit: 2011-03-26 | Discharge: 2011-03-26 | Disposition: A | Discharge status: Home / Self Care | Payer: Self-pay | Attending: Emergency Medicine | Admitting: Emergency Medicine

## 2011-03-26 ENCOUNTER — Encounter (HOSPITAL_BASED_OUTPATIENT_CLINIC_OR_DEPARTMENT_OTHER): Payer: Self-pay | Admitting: *Deleted

## 2011-03-26 ENCOUNTER — Emergency Department (INDEPENDENT_AMBULATORY_CARE_PROVIDER_SITE_OTHER): Payer: Self-pay

## 2011-03-26 DIAGNOSIS — N390 Urinary tract infection, site not specified: Secondary | ICD-10-CM | POA: Insufficient documentation

## 2011-03-26 DIAGNOSIS — R1032 Left lower quadrant pain: Secondary | ICD-10-CM

## 2011-03-26 DIAGNOSIS — F17200 Nicotine dependence, unspecified, uncomplicated: Secondary | ICD-10-CM | POA: Insufficient documentation

## 2011-03-26 DIAGNOSIS — R109 Unspecified abdominal pain: Secondary | ICD-10-CM | POA: Insufficient documentation

## 2011-03-26 HISTORY — DX: Carrier or suspected carrier of methicillin resistant Staphylococcus aureus: Z22.322

## 2011-03-26 LAB — URINALYSIS, ROUTINE W REFLEX MICROSCOPIC
Bilirubin Urine: NEGATIVE
Glucose, UA: NEGATIVE mg/dL
Hgb urine dipstick: NEGATIVE
Ketones, ur: NEGATIVE mg/dL
Nitrite: NEGATIVE
Protein, ur: NEGATIVE mg/dL
Specific Gravity, Urine: 1.018 (ref 1.005–1.030)
Urobilinogen, UA: 0.2 mg/dL (ref 0.0–1.0)
pH: 7 (ref 5.0–8.0)

## 2011-03-26 LAB — PREGNANCY, URINE: Preg Test, Ur: NEGATIVE

## 2011-03-26 MED ORDER — TRAMADOL HCL 50 MG PO TABS: 50.0000 mg | ORAL_TABLET | Freq: Four times a day (QID) | ORAL | Status: DC | PRN

## 2011-03-26 MED ORDER — PROMETHAZINE HCL 25 MG/ML IJ SOLN
25.0000 mg | Freq: Once | INTRAMUSCULAR | Status: AC
  Administered 2011-03-26: 25 mg via INTRAMUSCULAR
  Filled 2011-03-26: qty 1

## 2011-03-26 MED ORDER — KETOROLAC TROMETHAMINE 60 MG/2ML IM SOLN
60.0000 mg | Freq: Once | INTRAMUSCULAR | Status: AC
  Administered 2011-03-26: 60 mg via INTRAMUSCULAR
  Filled 2011-03-26: qty 2

## 2011-03-26 MED ORDER — NITROFURANTOIN MONOHYD MACRO 100 MG PO CAPS: 100.0000 mg | ORAL_CAPSULE | Freq: Two times a day (BID) | ORAL | Status: AC

## 2011-03-26 MED ORDER — OXYCODONE-ACETAMINOPHEN 5-325 MG PO TABS
1.0000 | ORAL_TABLET | Freq: Once | ORAL | Status: AC
  Administered 2011-03-26: 1 via ORAL
  Filled 2011-03-26: qty 1

## 2011-03-26 NOTE — ED Notes (Signed)
Pt states that toradol will not work, that she needs something stronger.  Pt suggested that the last two times she was seen recently, she received percocet.

## 2011-03-26 NOTE — ED Provider Notes (Addendum)
History     Chief Complaint  Patient presents with  . Flank Pain   Patient is a 30 y.o. female presenting with flank pain. The history is provided by the patient. No language interpreter was used.  Flank Pain This is a recurrent problem. The current episode started today. The problem occurs constantly. The problem has been unchanged. Pertinent negatives include no rash, vomiting or weakness. The symptoms are aggravated by nothing. She has tried nothing for the symptoms.    Past Medical History  Diagnosis Date  . Kidney stone   . Kidney stone   . MRSA (methicillin resistant staph aureus) culture positive     History reviewed. No pertinent past surgical history.  History reviewed. No pertinent family history.  History  Substance Use Topics  . Smoking status: Current Everyday Smoker -- 0.5 packs/day    Types: Cigarettes  . Smokeless tobacco: Not on file  . Alcohol Use: No    OB History    Grav Para Term Preterm Abortions TAB SAB Ect Mult Living   1 1              Review of Systems  Gastrointestinal: Negative for vomiting.  Genitourinary: Positive for flank pain.  Skin: Negative for rash.  Neurological: Negative for weakness.  All other systems reviewed and are negative.    Physical Exam  BP 132/78  Pulse 79  Temp(Src) 98.7 F (37.1 C) (Oral)  Resp 20  Ht 5\' 8"  (1.727 m)  Wt 180 lb (81.647 kg)  BMI 27.37 kg/m2  SpO2 100%  LMP 02/18/2011  Physical Exam  Nursing note and vitals reviewed. Constitutional: She is oriented to person, place, and time. She appears well-developed and well-nourished.  Cardiovascular: Normal rate and regular rhythm.   Pulmonary/Chest: Effort normal and breath sounds normal.  Abdominal: Soft.  Musculoskeletal: Normal range of motion.  Neurological: She is oriented to person, place, and time.  Skin: Skin is warm and dry.  Psychiatric: She has a normal mood and affect.    ED Course  Procedures Results for orders placed during the  hospital encounter of 03/26/11  PREGNANCY, URINE      Component Value Range   Preg Test, Ur NEGATIVE    URINALYSIS, ROUTINE W REFLEX MICROSCOPIC      Component Value Range   Color, Urine YELLOW  YELLOW    Appearance CLOUDY (*) CLEAR    Specific Gravity, Urine 1.018  1.005 - 1.030    pH 7.0  5.0 - 8.0    Glucose, UA NEGATIVE  NEGATIVE (mg/dL)   Hgb urine dipstick NEGATIVE  NEGATIVE    Bilirubin Urine NEGATIVE  NEGATIVE    Ketones, ur NEGATIVE  NEGATIVE (mg/dL)   Protein, ur NEGATIVE  NEGATIVE (mg/dL)   Urobilinogen, UA 0.2  0.0 - 1.0 (mg/dL)   Nitrite NEGATIVE  NEGATIVE    Leukocytes, UA MODERATE (*) NEGATIVE   URINE MICROSCOPIC-ADD ON      Component Value Range   Squamous Epithelial / LPF MANY (*) RARE    WBC, UA 21-50  <3 (WBC/hpf)   Bacteria, UA MANY (*) RARE    Dg Abd 1 View  03/26/2011  *RADIOLOGY REPORT*  Clinical Data: Left lower quadrant abdominal pain.  ABDOMEN - 1 VIEW  Comparison: CT scan from 620-12.  Findings: Supine abdomen shows air scattered through nondilated small bowel with some air visible in the stomach and transverse colon.  No evidence for small bowel obstruction.  No unexpected abdominopelvic calcification.  The  visualized bony structures are unremarkable.  IMPRESSION: No evidence for bowel obstruction.  Original Report Authenticated By: ERIC A. MANSELL, M.D.    MDM Pt has a uti as with previous visits:evaluated culture, will treat with nitrofuratioin which shows sensitivity:no sign of stone on ct and nothing noted on x-ray to be consistent with that:not concerned with a pyelo:discussed with pt that would not give narcotics as she needs to follow up:pt very upset and called me to the room and refusing to leave without narcotic pain medication:discussed with pt that she needs to follow up and that we will not continue to give pain medication because it is a persistent problem and she needs to see the specialist      Teressa Lower, NP 03/26/11  2118  Teressa Lower, NP 03/26/11 2123  Teressa Lower, NP 03/26/11 2144  Teressa Lower, NP 04/26/11 2133

## 2011-03-26 NOTE — ED Notes (Signed)
Pt states she has a hx of kidney stones and has had left flank pain x 2 months. "Can't pass it". Tearful at triage.

## 2011-03-26 NOTE — ED Notes (Signed)
Pt irrate that we will not give her a rx for pain meds explained dx explained that she will need to follow up with urologist asked for directors number so that she may file a complaint gave her information regarding that pt asked to speak to NP that was seeing her notified [place] NP who then explained her dx and tx plan pt refused to sign the DC paper

## 2011-03-27 NOTE — ED Provider Notes (Signed)
Medical screening examination/treatment/procedure(s) were performed by non-physician practitioner and as supervising physician I was immediately available for consultation/collaboration.  Juliet Rude. Rubin Payor, MD 03/27/11 1610

## 2011-03-30 ENCOUNTER — Encounter (HOSPITAL_COMMUNITY): Payer: Self-pay | Admitting: *Deleted

## 2011-03-30 ENCOUNTER — Emergency Department (HOSPITAL_COMMUNITY)
Admission: EM | Admit: 2011-03-30 | Discharge: 2011-03-30 | Disposition: A | Discharge status: Home / Self Care | Payer: Self-pay | Attending: Emergency Medicine | Admitting: Emergency Medicine

## 2011-03-30 DIAGNOSIS — F17200 Nicotine dependence, unspecified, uncomplicated: Secondary | ICD-10-CM | POA: Insufficient documentation

## 2011-03-30 DIAGNOSIS — M549 Dorsalgia, unspecified: Secondary | ICD-10-CM | POA: Insufficient documentation

## 2011-03-30 DIAGNOSIS — Z87442 Personal history of urinary calculi: Secondary | ICD-10-CM | POA: Insufficient documentation

## 2011-03-30 DIAGNOSIS — R1032 Left lower quadrant pain: Secondary | ICD-10-CM | POA: Insufficient documentation

## 2011-03-30 DIAGNOSIS — G8929 Other chronic pain: Secondary | ICD-10-CM

## 2011-03-30 LAB — URINALYSIS, ROUTINE W REFLEX MICROSCOPIC
Glucose, UA: NEGATIVE mg/dL
pH: 5.5 (ref 5.0–8.0)

## 2011-03-30 MED ORDER — OXYCODONE-ACETAMINOPHEN 5-325 MG PO TABS
2.0000 | ORAL_TABLET | Freq: Once | ORAL | Status: AC
  Administered 2011-03-30: 2 via ORAL
  Filled 2011-03-30: qty 2

## 2011-03-30 MED ORDER — OXYCODONE-ACETAMINOPHEN 5-325 MG PO TABS: 2.0000 | ORAL_TABLET | ORAL | Status: AC | PRN

## 2011-03-30 NOTE — ED Notes (Signed)
Pt still continues to have pain in back and left abd area, pt states that the pain radiates to back area,

## 2011-03-30 NOTE — ED Notes (Signed)
Pt reports being dx with a kidney stone here about a week ago.  Pt states that she was prescribed percocet for pain and was told to follow up w/ a urologist.  Pt states that when she tried to follow-up, she was told that she would need a referral.  Pt went to [facility] to get the referral and more pain meds a few days ago.  Pt states that they were rude to her and told her that "she did not have a kidney stone, and [facility] doesn't know what they are talking about".  Pt states " I cannot live with this pain".  nad noted

## 2011-03-30 NOTE — ED Provider Notes (Signed)
History   Chart scribed for Doug Sou, MD by Enos Fling; the patient was seen in room [facility]; this patient's care was started at 6:24 PM  CSN: [id number] Arrival date & time: 03/30/2011  6:11 PM  Chief Complaint  Patient presents with  . Back Pain   HPI Rebecca [name] is a 30 y.o. female who presents to the Emergency Department complaining of abd pain. Pt c/o LLQ abd pain that radiates to the left low back that has been persistent for the past month, waxing/waning but never gone. Pt has been seen in ED multiple times since onset, CT performed the first time and dx with kidney stone that she states she did not pass. Pt was then see again a few days ago and put on abx for possible UTI, states she did not fill rx. No exacerbating or alleviating factors. Pt has been taking motrin for pain as well as the percocet every 4 hours as prescribed but ran out. States only the percocet "took the edge off." Pt reports fever, highest 103. Last ibuprofen 3 hours ago. LNMP 2 days ago. No abnormal vaginal discharge. Last normal BM this AM. Pt eating normally, no n/v/d  PAST MEDICAL HISTORY:  Past Medical History  Diagnosis Date  . Kidney stone   . Kidney stone   . MRSA (methicillin resistant staph aureus) culture positive   MRSA in 2006  PAST SURGICAL HISTORY:  History reviewed. No pertinent past surgical history.   MEDICATIONS:  Previous Medications   IBUPROFEN (ADVIL,MOTRIN) 800 MG TABLET    Take 800 mg by mouth 2 (two) times daily as needed. For breakthrough pain     NITROFURANTOIN, MACROCRYSTAL-MONOHYDRATE, (MACROBID) 100 MG CAPSULE    Take 1 capsule (100 mg total) by mouth 2 (two) times daily.   OXYCODONE-ACETAMINOPHEN (PERCOCET) 5-325 MG PER TABLET    Take 2 tablets by mouth every 4 (four) hours as needed. For pain   *Not taking macrobid   ALLERGIES:  Allergies as of 03/30/2011 - Review Complete 03/30/2011  Allergen Reaction Noted  . Citalopram hydrobromide  12/14/2007  .  Propoxyphene n-acetaminophen  10/05/2006  . Tramadol Palpitations 03/26/2011     FAMILY HISTORY:  History reviewed. No pertinent family history.   SOCIAL HISTORY: History   Social History  . Marital Status: Divorced    Spouse Name: N/A    Number of Children: N/A  . Years of Education: N/A   Social History Main Topics  . Smoking status: Current Everyday Smoker -- 0.5 packs/day    Types: Cigarettes  . Smokeless tobacco: None  . Alcohol Use: No  . Drug Use: No  . Sexually Active: Yes    Birth Control/ Protection: None   Other Topics Concern  . None   Social History Narrative  . None     Review of Systems  Constitutional: Negative.   HENT: Negative.   Respiratory: Negative.   Cardiovascular: Negative.   Gastrointestinal: Positive for abdominal pain.  Musculoskeletal: Positive for back pain.  Skin: Negative.   Neurological: Negative.   Hematological: Negative.   Psychiatric/Behavioral: Negative.     Physical Exam  BP 162/91  Pulse 68  Temp(Src) 98.7 F (37.1 C) (Oral)  Resp 20  SpO2 100%  LMP 03/28/2011  Physical Exam  Constitutional: She appears well-developed and well-nourished.  HENT:  Head: Normocephalic and atraumatic.  Eyes: Conjunctivae are normal. Pupils are equal, round, and reactive to light.  Neck: Neck supple. No tracheal deviation present. No thyromegaly present.  Cardiovascular:  Normal rate and regular rhythm.   No murmur heard. Pulmonary/Chest: Effort normal and breath sounds normal.  Abdominal: Soft. Bowel sounds are normal. She exhibits no distension. There is tenderness. There is no guarding.       Mild LLQ tenderness  Musculoskeletal: Normal range of motion. She exhibits no edema and no tenderness.  Neurological: She is alert. Coordination normal.  Skin: Skin is warm and dry. No rash noted.  Psychiatric: She has a normal mood and affect.    ED Course  Procedures  OTHER DATA REVIEWED: Nursing notes, vital signs, and past medical  records reviewed. Prior records indicate: CT from June 31st reviewed by myself, Doug Sou, MD. Reviewed EDP note from 03/26/11 ED visit, pt requested narcotics at that visit and became agitated when none were given.    LABS / RADIOLOGY: Results for orders placed during the hospital encounter of 03/30/11  URINALYSIS, ROUTINE W REFLEX MICROSCOPIC      Component Value Range   Color, Urine YELLOW  YELLOW    Appearance CLEAR  CLEAR    Specific Gravity, Urine 1.010  1.005 - 1.030    pH 5.5  5.0 - 8.0    Glucose, UA NEGATIVE  NEGATIVE (mg/dL)   Hgb urine dipstick TRACE (*) NEGATIVE    Bilirubin Urine NEGATIVE  NEGATIVE    Ketones, ur NEGATIVE  NEGATIVE (mg/dL)   Protein, ur NEGATIVE  NEGATIVE (mg/dL)   Urobilinogen, UA 0.2  0.0 - 1.0 (mg/dL)   Nitrite NEGATIVE  NEGATIVE    Leukocytes, UA TRACE (*) NEGATIVE   POCT PREGNANCY, URINE      Component Value Range   Preg Test, Ur NEGATIVE    URINE MICROSCOPIC-ADD ON      Component Value Range   Squamous Epithelial / LPF MANY (*) RARE    WBC, UA 3-6  <3 (WBC/hpf)   RBC / HPF 3-6  <3 (RBC/hpf)   Bacteria, UA RARE  RARE      ED COURSE / COORDINATION OF CARE: 6:50 PM -- Discussed with pt reviewed records, will give small amount percocet, recommended urology f/u as soon as possible. 8:14 PM -- Pain improved, pt resting comfortably, agrees with discharge. Discussed urology f/u at scheduled appt in a few weeks or sooner if pain returns.   MDM: Pt with multiple work ups for same pain; will check urine again and refer to [facility] where pt has an appt this month  IMPRESSION: Left flank pain, chronic    PLAN:  Discharge The patient is to return the emergency department if there is any worsening of symptoms. I have reviewed the discharge instructions with the patient and family.   CONDITION ON DISCHARGE: stable   MEDICATIONS GIVEN IN THE E.D. oxyCODONE-acetaminophen (PERCOCET) 5-325 MG per tablet 2 tablet (2 tablet Oral Given  03/30/11 1917)     DISCHARGE MEDICATIONS: New Prescriptions   OXYCODONE-ACETAMINOPHEN (PERCOCET) 5-325 MG PER TABLET    Take 2 tablets by mouth every 4 (four) hours as needed for pain.        I personally performed the services described in this documentation, which was scribed in my presence. The recorded information has been reviewed and considered. Doug Sou, MD   Doug Sou, MD 03/30/11 2020

## 2011-03-30 NOTE — ED Notes (Signed)
Pt was told to come to er for pain control by [facility], pt has appointment with them on aug ?24th.

## 2011-04-05 ENCOUNTER — Ambulatory Visit (INDEPENDENT_AMBULATORY_CARE_PROVIDER_SITE_OTHER): Payer: Self-pay | Admitting: Urology

## 2011-04-05 ENCOUNTER — Other Ambulatory Visit: Payer: Self-pay | Admitting: Urology

## 2011-04-05 DIAGNOSIS — R82998 Other abnormal findings in urine: Secondary | ICD-10-CM

## 2011-04-05 DIAGNOSIS — N200 Calculus of kidney: Secondary | ICD-10-CM

## 2011-04-05 DIAGNOSIS — N201 Calculus of ureter: Secondary | ICD-10-CM

## 2011-04-07 ENCOUNTER — Ambulatory Visit (HOSPITAL_COMMUNITY): Payer: Self-pay | Attending: Urology

## 2011-04-12 ENCOUNTER — Inpatient Hospital Stay (HOSPITAL_COMMUNITY): Admission: RE | Admit: 2011-04-12 | Payer: Self-pay | Source: Ambulatory Visit

## 2011-04-17 ENCOUNTER — Encounter (HOSPITAL_COMMUNITY): Payer: Self-pay | Admitting: *Deleted

## 2011-04-17 ENCOUNTER — Emergency Department (HOSPITAL_COMMUNITY): Payer: Self-pay

## 2011-04-17 ENCOUNTER — Emergency Department (HOSPITAL_COMMUNITY)
Admission: EM | Admit: 2011-04-17 | Discharge: 2011-04-17 | Disposition: A | Discharge status: Home / Self Care | Payer: Self-pay | Attending: Emergency Medicine | Admitting: Emergency Medicine

## 2011-04-17 DIAGNOSIS — N390 Urinary tract infection, site not specified: Secondary | ICD-10-CM | POA: Insufficient documentation

## 2011-04-17 DIAGNOSIS — R109 Unspecified abdominal pain: Secondary | ICD-10-CM | POA: Insufficient documentation

## 2011-04-17 DIAGNOSIS — N12 Tubulo-interstitial nephritis, not specified as acute or chronic: Secondary | ICD-10-CM

## 2011-04-17 DIAGNOSIS — F17200 Nicotine dependence, unspecified, uncomplicated: Secondary | ICD-10-CM | POA: Insufficient documentation

## 2011-04-17 DIAGNOSIS — Z87442 Personal history of urinary calculi: Secondary | ICD-10-CM | POA: Insufficient documentation

## 2011-04-17 LAB — CBC
HCT: 42.8 % (ref 36.0–46.0)
MCH: 28.9 pg (ref 26.0–34.0)
MCHC: 32.7 g/dL (ref 30.0–36.0)
RDW: 13 % (ref 11.5–15.5)

## 2011-04-17 LAB — URINALYSIS, ROUTINE W REFLEX MICROSCOPIC
Glucose, UA: NEGATIVE mg/dL
Specific Gravity, Urine: 1.02 (ref 1.005–1.030)
Urobilinogen, UA: 0.2 mg/dL (ref 0.0–1.0)

## 2011-04-17 LAB — PREGNANCY, URINE: Preg Test, Ur: NEGATIVE

## 2011-04-17 LAB — BASIC METABOLIC PANEL
BUN: 7 mg/dL (ref 6–23)
Calcium: 10 mg/dL (ref 8.4–10.5)
Creatinine, Ser: 0.85 mg/dL (ref 0.50–1.10)
GFR calc Af Amer: >60 mL/min (ref >60)
GFR calc non Af Amer: >60 mL/min (ref >60)
Glucose, Bld: 95 mg/dL (ref 70–99)
Potassium: 3.7 mEq/L (ref 3.5–5.1)

## 2011-04-17 LAB — URINE MICROSCOPIC-ADD ON

## 2011-04-17 MED ORDER — ONDANSETRON HCL 4 MG/2ML IJ SOLN
4.0000 mg | Freq: Once | INTRAMUSCULAR | Status: AC
  Administered 2011-04-17: 4 mg via INTRAVENOUS
  Filled 2011-04-17: qty 2

## 2011-04-17 MED ORDER — HYDROMORPHONE HCL 1 MG/ML IJ SOLN
1.0000 mg | Freq: Once | INTRAMUSCULAR | Status: AC
  Administered 2011-04-17: 1 mg via INTRAVENOUS
  Filled 2011-04-17: qty 1

## 2011-04-17 MED ORDER — CEPHALEXIN 500 MG PO CAPS: 500.0000 mg | ORAL_CAPSULE | Freq: Four times a day (QID) | ORAL | Status: AC

## 2011-04-17 MED ORDER — DEXTROSE 5 % IV SOLN
1.0000 g | Freq: Once | INTRAVENOUS | Status: AC
  Administered 2011-04-17: 1 g via INTRAVENOUS
  Filled 2011-04-17: qty 1

## 2011-04-17 MED ORDER — OXYCODONE-ACETAMINOPHEN 5-325 MG PO TABS: 1.0000 | ORAL_TABLET | Freq: Four times a day (QID) | ORAL | Status: AC | PRN

## 2011-04-17 MED ORDER — SODIUM CHLORIDE 0.9 % IV BOLUS (SEPSIS)
1000.0000 mL | Freq: Once | INTRAVENOUS | Status: AC
  Administered 2011-04-17: 1000 mL via INTRAVENOUS

## 2011-04-17 NOTE — ED Provider Notes (Signed)
History   Scribed for Ethelda Chick, MD, the patient was seen in room APA17/APA17. This chart was scribed by Clarita Crane. This patient's care was started at 9:34AM.   CSN: [id number] Arrival date & time: 04/17/2011  9:07 AM  Chief Complaint  Patient presents with  . Flank Pain   The history is provided by the patient.    Rebecca [name] is a 30 y.o. female who presents to the Emergency Department complaining of constant stabbing left sided abdominal pain radiating to left back with onset occuring about 2 months ago and associated symptoms of difficulty sleeping, nausea, fever, and discolored urine. Patient stated that she has had a fever for 2 months and describes urine as orangish-red in color. Denies vomiting. States pain is relieved by nothing. Patient reports she was evaluated in ED 2 months ago and dx with a kidney stone which would need to be removed. Patent notes she has not had a procedure to have the kidney stone removed at this time. Additionally, patient notes extensive recent history of UTIs and states she has been on multiple rounds of abx recently to treat these. Reports she finished a 1 week course of Cipro 1 week ago and 2 weeks before that finished a course of Keflex.    HPI ELEMENTS: Location: Left abdomen radiating to left back.  Onset: 2 months ago. Duration: Persistent since onset. Quality: stabbing  Timing: Constant Context: as above  Associated symptoms: Difficulty sleeping, nausea, fever and discolored urine.  PAST MEDICAL HISTORY:  Past Medical History  Diagnosis Date  . Kidney stone   . Kidney stone   . MRSA (methicillin resistant staph aureus) culture positive     PAST SURGICAL HISTORY:  History reviewed. No pertinent past surgical history.  MEDICATIONS:  Previous Medications   IBUPROFEN (ADVIL,MOTRIN) 800 MG TABLET    Take 800 mg by mouth 2 (two) times daily as needed. For breakthrough pain     OXYCODONE-ACETAMINOPHEN (PERCOCET) 5-325 MG PER  TABLET    Take 2 tablets by mouth every 4 (four) hours as needed. For pain      ALLERGIES:  Allergies as of 04/17/2011 - Review Complete 04/17/2011  Allergen Reaction Noted  . Citalopram hydrobromide Other (See Comments) 12/14/2007  . Propoxyphene n-acetaminophen Other (See Comments) 10/05/2006  . Tramadol Palpitations 03/26/2011     FAMILY HISTORY:  No family history on file.   SOCIAL HISTORY: History   Social History  . Marital Status: Divorced    Spouse Name: N/A    Number of Children: N/A  . Years of Education: N/A   Social History Main Topics  . Smoking status: Current Everyday Smoker -- 0.5 packs/day    Types: Cigarettes  . Smokeless tobacco: None  . Alcohol Use: No  . Drug Use: No  . Sexually Active: Yes    Birth Control/ Protection: None   Other Topics Concern  . None   Social History Narrative  . None     OB History    Grav Para Term Preterm Abortions TAB SAB Ect Mult Living   1 1              Review of Systems 10 Systems reviewed and are negative for acute change except as noted in the HPI.  Physical Exam  BP 142/89  Pulse 78  Temp(Src) 98.5 F (36.9 C) (Oral)  Resp 18  Ht 5\' 8"  (1.727 m)  Wt 180 lb (81.647 kg)  BMI 27.37 kg/m2  SpO2 100%  LMP 04/05/2011  Physical Exam  Nursing note and vitals reviewed. Constitutional: She is oriented to person, place, and time. She appears well-developed and well-nourished. No distress.       Vitals- Hypertensive.   HENT:  Head: Normocephalic and atraumatic.  Eyes: EOM are normal.  Neck: Neck supple.  Cardiovascular: Normal rate, regular rhythm and normal heart sounds.  Exam reveals no gallop and no friction rub.   No murmur heard. Pulmonary/Chest: Effort normal and breath sounds normal. She has no wheezes.  Abdominal: Soft. Bowel sounds are normal. She exhibits no distension. There is no tenderness. There is no CVA tenderness.  Musculoskeletal: She exhibits no edema.  Neurological: She is alert and  oriented to person, place, and time. No sensory deficit.  Skin: Skin is warm and dry. She is not diaphoretic.  Psychiatric: She has a normal mood and affect. Her behavior is normal.    ED Course  Procedures OTHER DATA REVIEWED: Nursing notes, vital signs, and past medical records reviewed. Lab results reviewed and considered Imaging results reviewed and considered Previous medical records reviewed and considered  DIAGNOSTIC STUDIES: Oxygen Saturation is 100% on room air, normal by my interpretation.    LABS / RADIOLOGY: Results for orders placed during the hospital encounter of 04/17/11  URINALYSIS, ROUTINE W REFLEX MICROSCOPIC      Component Value Range   Color, Urine YELLOW  YELLOW    Appearance HAZY (*) CLEAR    Specific Gravity, Urine 1.020  1.005 - 1.030    pH 6.0  5.0 - 8.0    Glucose, UA NEGATIVE  NEGATIVE (mg/dL)   Hgb urine dipstick TRACE (*) NEGATIVE    Bilirubin Urine NEGATIVE  NEGATIVE    Ketones, ur NEGATIVE  NEGATIVE (mg/dL)   Protein, ur NEGATIVE  NEGATIVE (mg/dL)   Urobilinogen, UA 0.2  0.0 - 1.0 (mg/dL)   Nitrite POSITIVE (*) NEGATIVE    Leukocytes, UA SMALL (*) NEGATIVE   PREGNANCY, URINE      Component Value Range   Preg Test, Ur NEGATIVE    URINE MICROSCOPIC-ADD ON      Component Value Range   Squamous Epithelial / LPF MANY (*) RARE    WBC, UA 11-20  <3 (WBC/hpf)   RBC / HPF 0-2  <3 (RBC/hpf)   Bacteria, UA MANY (*) RARE   CBC      Component Value Range   WBC 13.9 (*) 4.0 - 10.5 (K/uL)   RBC 4.85  3.87 - 5.11 (MIL/uL)   Hemoglobin 14.0  12.0 - 15.0 (g/dL)   HCT 11.9  14.7 - 82.9 (%)   MCV 88.2  78.0 - 100.0 (fL)   MCH 28.9  26.0 - 34.0 (pg)   MCHC 32.7  30.0 - 36.0 (g/dL)   RDW 56.2  13.0 - 86.5 (%)   Platelets 278  150 - 400 (K/uL)  BASIC METABOLIC PANEL      Component Value Range   Sodium 139  135 - 145 (mEq/L)   Potassium 3.7  3.5 - 5.1 (mEq/L)   Chloride 102  96 - 112 (mEq/L)   CO2 25  19 - 32 (mEq/L)   Glucose, Bld 95  70 - 99 (mg/dL)    BUN 7  6 - 23 (mg/dL)   Creatinine, Ser 7.84  0.50 - 1.10 (mg/dL)   Calcium 69.6  8.4 - 10.5 (mg/dL)   GFR calc non Af Amer >60  >60 (mL/min)   GFR calc Af Amer >60  >60 (mL/min)  Ct Abdomen Pelvis Wo Contrast  04/17/2011  *RADIOLOGY REPORT*  Clinical Data: Left-sided flank pain for 2 months.  CT ABDOMEN AND PELVIS WITHOUT CONTRAST  Technique:  Multidetector CT imaging of the abdomen and pelvis was performed following the standard protocol without intravenous contrast.  Comparison: 02/18/2011  Findings: Images of the lung bases are unremarkable.  No intrarenal or ureteral calculi are identified.  There is subtle right perinephric stranding.  No focal abnormality identified within the liver, spleen, pancreas, adrenal glands, or kidneys. The gallbladder is present.  Stomach and visualized bowel loops are normal in caliber and wall thickness.  The appendix is well seen and has a normal appearance.  No retroperitoneal or mesenteric adenopathy. No evidence for aortic aneurysm.  The uterus is present.  No adnexal mass or free pelvic fluid identified. Visualized osseous structures have a normal appearance.  IMPRESSION:  1.  No evidence for renal or ureteral calculi. 2.  Subtle perinephric stranding on the right.  This is nonspecific but can be seen with urinary tract infection.  Original Report Authenticated By: Patterson Hammersmith, M.D.    PROCEDURES:  ED COURSE / COORDINATION OF CARE: Orders Placed This Encounter  Procedures  . CT Abdomen Pelvis Wo Contrast  . Urinalysis with microscopic  . Pregnancy, urine  . Urine microscopic-add on  . CBC  . Basic metabolic panel  . Saline lock IV     MDM: Differential Diagnosis: Per chart review of multiple visits over the past month, pt has been treated for UTI with macrodantin, and cipro.  Urine culture from 7/12 is the only culture result available to me, shows ecoli, pansensitive.  Pt states she finished a second course of cipro approx 1 week ago.   Will give rocephin in ED and discharge with rx for keflex for UTI.  Pt has seen Dr. Retta Diones last week- she will need to follow up with Dr. Retta Diones in the next few days.     PLAN: The patient is to return the emergency department if there is any worsening of symptoms. I have reviewed the discharge instructions with the patient/family  CONDITION ON DISCHARGE:  stable   MEDICATIONS GIVEN IN THE E.D.  Medications  sodium chloride 0.9 % bolus 1,000 mL (1000 mL Intravenous Given 04/17/11 1018)  HYDROmorphone (DILAUDID) injection 1 mg (1 mg Intravenous Given 04/17/11 1019)  ondansetron (ZOFRAN) injection 4 mg (4 mg Intravenous Given 04/17/11 1019)     I personally performed the services described in this documentation, which was scribed in my presence. The recorded information has been reviewed and considered. Ethelda Chick, MD    Ethelda Chick, MD 04/17/11 [phone]

## 2011-04-17 NOTE — ED Notes (Signed)
Pt states she has been having left flank pain for aprox 2 months. Pt states pain has been constant. Pt states dx in past 2 months has been kidney stones.

## 2011-04-26 ENCOUNTER — Ambulatory Visit: Payer: Self-pay | Admitting: Urology

## 2011-04-30 DIAGNOSIS — F99 Mental disorder, not otherwise specified: Secondary | ICD-10-CM

## 2011-04-30 HISTORY — DX: Mental disorder, not otherwise specified: F99

## 2011-05-04 ENCOUNTER — Emergency Department (HOSPITAL_COMMUNITY)
Admission: EM | Admit: 2011-05-04 | Discharge: 2011-05-04 | Disposition: A | Discharge status: Home / Self Care | Payer: Self-pay | Attending: Emergency Medicine | Admitting: Emergency Medicine

## 2011-05-04 ENCOUNTER — Emergency Department (HOSPITAL_COMMUNITY): Payer: Self-pay

## 2011-05-04 DIAGNOSIS — Z8614 Personal history of Methicillin resistant Staphylococcus aureus infection: Secondary | ICD-10-CM | POA: Insufficient documentation

## 2011-05-04 DIAGNOSIS — Z87442 Personal history of urinary calculi: Secondary | ICD-10-CM | POA: Insufficient documentation

## 2011-05-04 DIAGNOSIS — F341 Dysthymic disorder: Secondary | ICD-10-CM | POA: Insufficient documentation

## 2011-05-04 DIAGNOSIS — M25569 Pain in unspecified knee: Secondary | ICD-10-CM | POA: Insufficient documentation

## 2011-05-04 DIAGNOSIS — IMO0002 Reserved for concepts with insufficient information to code with codable children: Secondary | ICD-10-CM | POA: Insufficient documentation

## 2011-05-04 NOTE — ED Provider Notes (Signed)
Medical screening examination/treatment/procedure(s) were performed by non-physician practitioner and as supervising physician I was immediately available for consultation/collaboration.   Juliet Rude. Rubin Payor, MD 05/04/11 [phone]

## 2011-05-05 ENCOUNTER — Emergency Department (HOSPITAL_COMMUNITY)
Admission: EM | Admit: 2011-05-05 | Discharge: 2011-05-05 | Disposition: A | Discharge status: Home / Self Care | Payer: Self-pay | Attending: Emergency Medicine | Admitting: Emergency Medicine

## 2011-05-05 DIAGNOSIS — O99891 Other specified diseases and conditions complicating pregnancy: Secondary | ICD-10-CM | POA: Insufficient documentation

## 2011-05-05 DIAGNOSIS — F341 Dysthymic disorder: Secondary | ICD-10-CM | POA: Insufficient documentation

## 2011-05-05 DIAGNOSIS — O99340 Other mental disorders complicating pregnancy, unspecified trimester: Secondary | ICD-10-CM | POA: Insufficient documentation

## 2011-05-05 DIAGNOSIS — F1910 Other psychoactive substance abuse, uncomplicated: Secondary | ICD-10-CM | POA: Insufficient documentation

## 2011-05-05 LAB — RAPID URINE DRUG SCREEN, HOSP PERFORMED
Barbiturates: NOT DETECTED
Benzodiazepines: NOT DETECTED
Cocaine: NOT DETECTED
Opiates: POSITIVE — AB
Tetrahydrocannabinol: NOT DETECTED

## 2011-05-05 LAB — DIFFERENTIAL
Basophils Absolute: 0 10*3/uL (ref 0.0–0.1)
Basophils Relative: 0 % (ref 0–1)
Eosinophils Absolute: 0.2 10*3/uL (ref 0.0–0.7)
Eosinophils Relative: 2 % (ref 0–5)
Monocytes Absolute: 1 10*3/uL (ref 0.1–1.0)
Monocytes Relative: 8 % (ref 3–12)
Neutro Abs: 8.2 10*3/uL — ABNORMAL HIGH (ref 1.7–7.7)

## 2011-05-05 LAB — COMPREHENSIVE METABOLIC PANEL
ALT: 10 U/L (ref 0–35)
AST: 14 U/L (ref 0–37)
CO2: 23 mEq/L (ref 19–32)
Calcium: 9.5 mg/dL (ref 8.4–10.5)
Creatinine, Ser: 0.87 mg/dL (ref 0.50–1.10)
GFR calc Af Amer: >60 mL/min (ref >60)
GFR calc non Af Amer: >60 mL/min (ref >60)
Sodium: 136 mEq/L (ref 135–145)
Total Protein: 7.9 g/dL (ref 6.0–8.3)

## 2011-05-05 LAB — URINALYSIS, ROUTINE W REFLEX MICROSCOPIC
Glucose, UA: NEGATIVE mg/dL
Ketones, ur: NEGATIVE mg/dL
Leukocytes, UA: NEGATIVE
Protein, ur: NEGATIVE mg/dL
Urobilinogen, UA: 0.2 mg/dL (ref 0.0–1.0)

## 2011-05-05 LAB — CBC
MCH: 29.4 pg (ref 26.0–34.0)
MCHC: 33.6 g/dL (ref 30.0–36.0)
Platelets: 221 10*3/uL (ref 150–400)
RDW: 14.1 % (ref 11.5–15.5)

## 2011-05-05 LAB — WET PREP, GENITAL

## 2011-05-06 LAB — GC/CHLAMYDIA PROBE AMP, GENITAL: Chlamydia, DNA Probe: NEGATIVE

## 2011-05-09 ENCOUNTER — Encounter (HOSPITAL_COMMUNITY): Payer: Self-pay | Admitting: *Deleted

## 2011-05-09 ENCOUNTER — Inpatient Hospital Stay (HOSPITAL_COMMUNITY): Payer: Self-pay

## 2011-05-09 ENCOUNTER — Inpatient Hospital Stay (HOSPITAL_COMMUNITY)
Admission: AD | Admit: 2011-05-09 | Discharge: 2011-05-09 | Disposition: A | Discharge status: Home / Self Care | Payer: Self-pay | Source: Ambulatory Visit | Attending: Obstetrics and Gynecology | Admitting: Obstetrics and Gynecology

## 2011-05-09 DIAGNOSIS — O99891 Other specified diseases and conditions complicating pregnancy: Secondary | ICD-10-CM | POA: Insufficient documentation

## 2011-05-09 DIAGNOSIS — F1123 Opioid dependence with withdrawal: Secondary | ICD-10-CM

## 2011-05-09 DIAGNOSIS — O26899 Other specified pregnancy related conditions, unspecified trimester: Secondary | ICD-10-CM

## 2011-05-09 DIAGNOSIS — R109 Unspecified abdominal pain: Secondary | ICD-10-CM

## 2011-05-09 DIAGNOSIS — F1920 Other psychoactive substance dependence, uncomplicated: Secondary | ICD-10-CM | POA: Insufficient documentation

## 2011-05-09 DIAGNOSIS — F1120 Opioid dependence, uncomplicated: Secondary | ICD-10-CM | POA: Diagnosis present

## 2011-05-09 DIAGNOSIS — Z3480 Encounter for supervision of other normal pregnancy, unspecified trimester: Secondary | ICD-10-CM

## 2011-05-09 DIAGNOSIS — R102 Pelvic and perineal pain unspecified side: Secondary | ICD-10-CM

## 2011-05-09 HISTORY — DX: Anxiety disorder, unspecified: F41.9

## 2011-05-09 HISTORY — DX: Mental disorder, not otherwise specified: F99

## 2011-05-09 LAB — URINALYSIS, ROUTINE W REFLEX MICROSCOPIC
Bilirubin Urine: NEGATIVE
Glucose, UA: NEGATIVE mg/dL
Hgb urine dipstick: NEGATIVE
Ketones, ur: NEGATIVE mg/dL
Protein, ur: NEGATIVE mg/dL
Urobilinogen, UA: 0.2 mg/dL (ref 0.0–1.0)

## 2011-05-09 MED ORDER — HYDROXYZINE PAMOATE 25 MG PO CAPS: 25.0000 mg | ORAL_CAPSULE | Freq: Four times a day (QID) | ORAL | Status: AC | PRN

## 2011-05-09 MED ORDER — OXYCODONE-ACETAMINOPHEN 5-325 MG PO TABS: 1.0000 | ORAL_TABLET | Freq: Four times a day (QID) | ORAL | Status: AC | PRN

## 2011-05-09 MED ORDER — HYDROXYZINE HCL 50 MG PO TABS
50.0000 mg | ORAL_TABLET | Freq: Once | ORAL | Status: AC
  Administered 2011-05-09: 50 mg via ORAL
  Filled 2011-05-09: qty 1

## 2011-05-09 MED ORDER — DICYCLOMINE HCL 10 MG PO CAPS
10.0000 mg | ORAL_CAPSULE | Freq: Once | ORAL | Status: AC
  Administered 2011-05-09: 10 mg via ORAL
  Filled 2011-05-09: qty 1

## 2011-05-09 NOTE — Progress Notes (Signed)
Seen at [facility] for detox.  Dx with preg test.  Is high risk due to drug usuage and hx of gest diab.  Lower abd pain for past 2 days.  Has  Been trying to get preg for 8 yrs.

## 2011-05-09 NOTE — ED Provider Notes (Signed)
Attestation of Attending Supervision of Advanced Practitioner: Evaluation and management procedures were performed by the PA/NP/CNM/OB Fellow under my supervision/collaboration. Chart reviewed and agree with management and plan.  ANYANWU,UGONNA A 05/09/2011 10:09 PM

## 2011-05-09 NOTE — ED Provider Notes (Addendum)
History     No chief complaint on file.  HPI [name] is a 30 year old G2P1 at [redacted]w[redacted]d by LMP presenting with lower abdominal cramping.  The cramping started about 2 days ago, intermittent. No alleviating or aggravating factors.  Has tried tylenol for pain, has not taken any NSAIDs.  Denies any vaginal bleeding. Pt found out she was pregnant last week at an ER visit for a trauma.  Pt has been on a narcotic/APAP pill for the last 1-2 years (6 5mg  pills daily); she was trying to come off the pills when found to be pregnant.  Has not taken any pain pills since yesterday.  She is experiencing withdrawal symptoms including nausea, diarrhea, abdominal cramps, diaphoresis, anxiety, myalgias.    Prior pregnancy notable for partial abruption and gestation diabetes  OB History    Grav Para Term Preterm Abortions TAB SAB Ect Mult Living   2 1 1  0 0 0 0 0 0 1      Past Medical History  Diagnosis Date  . Kidney stone   . Kidney stone   . MRSA (methicillin resistant staph aureus) culture positive   . 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 Anxiety     currently no meds    Past Surgical History  Procedure Date  . Wisdom tooth extraction 1995    No family history on file.  History  Substance Use Topics  . Smoking status: Current Everyday Smoker -- 0.5 packs/day for 12 years    Types: Cigarettes  . Smokeless tobacco: Not on file  . Alcohol Use: No    Allergies:  Allergies  Allergen Reactions  . Citalopram Hydrobromide Other (See Comments)    dizzINESS, causes panic attacks  . Propoxyphene N-Acetaminophen Other (See Comments)    anxiety attacks  . Tramadol Palpitations    Prescriptions prior to admission  Medication Sig Dispense Refill  . ibuprofen (ADVIL,MOTRIN) 800 MG tablet Take 800 mg by mouth 2 (two) times daily as needed. For breakthrough pain        . oxyCODONE-acetaminophen (PERCOCET) 5-325 MG per tablet Take 2 tablets by mouth every 4 (four) hours as  needed. For pain         Review of Systems  Constitutional: Positive for diaphoresis. Negative for fever and chills.  Eyes: Negative.   Respiratory: Negative.   Cardiovascular: Negative.   Gastrointestinal: Positive for nausea, abdominal pain and diarrhea. Negative for vomiting, constipation and blood in stool.  Genitourinary:       White vaginal discharge, no vaginal itching or odor  Musculoskeletal: Positive for myalgias.  Skin: Negative for rash.  Neurological: Negative for headaches.  Psychiatric/Behavioral: The patient is nervous/anxious and has insomnia.    Physical Exam   Blood pressure 146/85, pulse 94, temperature 98.2 F (36.8 C), resp. rate 20, height 5\' 8"  (1.727 m), weight 192 lb 3.2 oz (87.181 kg), last menstrual period 03/24/2011.  Physical Exam  Constitutional: She is oriented to person, place, and time. She appears well-developed and well-nourished.  HENT:  Mouth/Throat: Oropharynx is clear and moist.  Eyes: No scleral icterus.  Neck: Normal range of motion. Neck supple.  Cardiovascular: Normal rate, regular rhythm, normal heart sounds and intact distal pulses.  Exam reveals no gallop.   No murmur heard. Respiratory: Effort normal. She has no wheezes. She has no rales.  GI: Soft. Bowel sounds are normal. She exhibits no mass. There is tenderness (lower quandrants). There is no rebound  and no guarding.  Musculoskeletal: She exhibits no edema and no tenderness.  Lymphadenopathy:    She has no cervical adenopathy.  Neurological: She is alert and oriented to person, place, and time.  Skin: She is diaphoretic (mildly).  Psychiatric: She has a normal mood and affect.    MAU Course  Procedures Transvaginal ultrasound pending  Assessment and Plan  30 year old G2P1 at [redacted]w[redacted]d by LMP with abdominal cramps concerning for withdrawal vs miscarriage vs ectopic. -transvaginal ultrasound pending -bentyl and vistaril for withdrawal symptoms -instructed to obtain medicaid,  will provide proof of pregnancy -instructed patient to find an OB for follow up  BOOTH, ERIN 05/09/2011, 6:18 PM   Assumed care of pt at 2000  US Ob Comp Less 14 Wks  05/09/2011  *RADIOLOGY REPORT*  Clinical Data: Pregnant, cramping. 6 weeks 4 days by LMP.  OBSTETRIC <14 WK [place] AND TRANSVAGINAL OB US  Technique:  Both transabdominal and transvaginal ultrasound examinations were performed for complete evaluation of the gestation as well as the maternal uterus, adnexal regions, and pelvic cul-de-sac.  Transvaginal technique was performed to assess early pregnancy.  Comparison:  None.  Intrauterine gestational sac:  Visualized/normal in shape. Yolk sac: Not identified Embryo: Not identified Cardiac Activity: Not applicable  MSD: 3.8  mm  four    w six    d [place] EDC: 01/10/2012  Maternal uterus/adnexae: Within normal limits  IMPRESSION: Intrauterine gestational sac identified.  Based on mean sac diameter, estimated age of 4 weeks, 6 days.  Given the patients clinical symptoms, and discrepancy in gestational age as compared to the LMP recommend a short-term ultrasound follow-up.  Original Report Authenticated By: Waneta Martins, M.D.   Results for orders placed during the hospital encounter of 05/09/11 (from the past 24 hour(s))  HCG, QUANTITATIVE, PREGNANCY     Status: Abnormal   Collection Time   05/09/11  4:39 PM      Component Value Range   hCG, Beta Chain, Quant, S 1448 (*) <5 (mIU/mL)  URINALYSIS, ROUTINE W REFLEX MICROSCOPIC     Status: Abnormal   Collection Time   05/09/11  5:00 PM      Component Value Range   Color, Urine YELLOW  YELLOW    Appearance HAZY (*) CLEAR    Specific Gravity, Urine 1.010  1.005 - 1.030    pH 7.0  5.0 - 8.0    Glucose, UA NEGATIVE  NEGATIVE (mg/dL)   Hgb urine dipstick NEGATIVE  NEGATIVE    Bilirubin Urine NEGATIVE  NEGATIVE    Ketones, ur NEGATIVE  NEGATIVE (mg/dL)   Protein, ur NEGATIVE  NEGATIVE (mg/dL)   Urobilinogen, UA 0.2  0.0 - 1.0 (mg/dL)   Nitrite  NEGATIVE  NEGATIVE    Leukocytes, UA NEGATIVE  NEGATIVE      Assessment 1. Untrauterine  GS, S<D, but pt reports irreg cycles 2. Opioid dependence, withdrawal  Plan: 1. Return to MAU for [place] for viability in 10 days. 2. Referred to [facility] per consult w/ Dr. Macon Large 3. Rx Percocet 5/325 #10 and Vistaril. Firmly explained to pt that MAU and OB providers will not Rx any more opiates.   4. SAB precautions

## 2011-05-17 ENCOUNTER — Inpatient Hospital Stay (HOSPITAL_COMMUNITY)
Admission: AD | Admit: 2011-05-17 | Discharge: 2011-05-17 | Disposition: A | Discharge status: Home / Self Care | Payer: Self-pay | Source: Ambulatory Visit | Attending: Obstetrics & Gynecology | Admitting: Obstetrics & Gynecology

## 2011-05-17 ENCOUNTER — Inpatient Hospital Stay (HOSPITAL_COMMUNITY): Payer: Self-pay

## 2011-05-17 DIAGNOSIS — B9689 Other specified bacterial agents as the cause of diseases classified elsewhere: Secondary | ICD-10-CM | POA: Insufficient documentation

## 2011-05-17 DIAGNOSIS — A499 Bacterial infection, unspecified: Secondary | ICD-10-CM | POA: Insufficient documentation

## 2011-05-17 DIAGNOSIS — O99891 Other specified diseases and conditions complicating pregnancy: Secondary | ICD-10-CM | POA: Insufficient documentation

## 2011-05-17 DIAGNOSIS — Z3490 Encounter for supervision of normal pregnancy, unspecified, unspecified trimester: Secondary | ICD-10-CM

## 2011-05-17 DIAGNOSIS — N760 Acute vaginitis: Secondary | ICD-10-CM | POA: Insufficient documentation

## 2011-05-17 DIAGNOSIS — O2390 Unspecified genitourinary tract infection in pregnancy, unspecified trimester: Secondary | ICD-10-CM | POA: Insufficient documentation

## 2011-05-17 DIAGNOSIS — Z1389 Encounter for screening for other disorder: Secondary | ICD-10-CM

## 2011-05-17 LAB — HCG, QUANTITATIVE, PREGNANCY: hCG, Beta Chain, Quant, S: 15266 m[IU]/mL — ABNORMAL HIGH (ref <5)

## 2011-05-17 MED ORDER — METRONIDAZOLE 500 MG PO TABS: 500.0000 mg | ORAL_TABLET | Freq: Two times a day (BID) | ORAL | Status: AC

## 2011-05-17 NOTE — ED Provider Notes (Signed)
History     Chief Complaint  Patient presents with  . Follow-up   HPITiffany [name] 30 y.o.presents with complaint of cramping and spotting.  She was seen here 9/10 requesting detox for pregnancy.  Patient was evaluated and referred to [facility].  Patient states she did not go.  She was scheduled to return in 2 days for repeat BHCG.  She is asking for medication since she cannot take her xanex and prozac in pregnancy.  She was given #10 percocet at that visit and told she would not be given any more narcotics by the providers here.  BHCG on that date was 1448.  U/S saw a IUGS without YS or FP.  ON that date she was [redacted]w[redacted]d by LMP, by U/S [redacted]w[redacted]d.  I spoke to her in triage, she is now waiting for U/S and labs.      Past Medical History  Diagnosis Date  . Kidney stone   . Kidney stone   . MRSA (methicillin resistant staph aureus) culture positive   . 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 Anxiety     currently no meds    Past Surgical History  Procedure Date  . Wisdom tooth extraction 1995    No family history on file.  History  Substance Use Topics  . Smoking status: Current Everyday Smoker -- 0.5 packs/day for 12 years    Types: Cigarettes  . Smokeless tobacco: Not on file  . Alcohol Use: No    Allergies:  Allergies  Allergen Reactions  . Citalopram Hydrobromide Other (See Comments)    dizzINESS, causes panic attacks  . Propoxyphene N-Acetaminophen Other (See Comments)    anxiety attacks  . Tramadol Palpitations    Prescriptions prior to admission  Medication Sig Dispense Refill  . hydrOXYzine (VISTARIL) 25 MG capsule Take 1 capsule (25 mg total) by mouth 4 (four) times daily as needed for itching.  30 capsule  0  . oxyCODONE-acetaminophen (ROXICET) 5-325 MG per tablet Take 1 tablet by mouth every 6 (six) hours as needed for pain.  10 tablet  0    ROS Physical Exam   Blood pressure 131/81, pulse 83, temperature 99.4 F (37.4 C), temperature source  Oral, resp. rate 20, last menstrual period 03/24/2011, SpO2 100.00%.  Physical Exam  Constitutional: She is oriented to person, place, and time. She appears well-developed and well-nourished. No distress.  HENT:  Head: Normocephalic.  Neck: Neck supple.  Respiratory: Effort normal.  GI: Soft. She exhibits no distension and no mass. There is no tenderness. There is no rebound and no guarding.  Genitourinary: Uterus is enlarged. Uterus is not tender. Cervix exhibits motion tenderness. Cervix exhibits no discharge and no friability. Right adnexum displays no mass, no tenderness and no fullness. Left adnexum displays mass. Left adnexum displays no tenderness and no fullness. No bleeding around the vagina. Vaginal discharge: large amount of maldorous discharge.  Neurological: She is alert and oriented to person, place, and time.  Skin: Skin is warm and dry.            Results for orders placed during the hospital encounter of 05/17/11 (from the past 24 hour(s))  HCG, QUANTITATIVE, PREGNANCY     Status: Abnormal   Collection Time   05/17/11  3:32 PM      Component Value Range   hCG, Beta Chain, Quant, S 15266 (*) <5 (mIU/mL)   US OB Transvaginal Status:  Final result  Study Result     *RADIOLOGY REPORT*  Clinical Data: 30 year old pregnant female with pelvic pain and  spotting. Recent ultrasound demonstrating gestational sac without  yolk sac or embryo.  TRANSVAGINAL OBSTETRIC US  Technique: Transvaginal ultrasound was performed for complete  evaluation of the gestation as well as the maternal uterus, adnexal  regions, and pelvic cul-de-sac.  Comparison: 05/09/2011  Intrauterine gestational sac: Single and normal in appearance.  Yolk sac: Present  Embryo: Present  Cardiac Activity: Present  Heart Rate: 98 beats per minute  CRL: 2.4 mm 5 w 5 d [place] EDC: 01/12/2012  Subchorionic hemorrhage: None  Maternal uterus/adnexae:  The ovaries bilaterally  are unremarkable.  There is no evidence of free fluid or adnexal mass.  IMPRESSION:  Single living intrauterine gestation with estimated gestational age  of [redacted] weeks 5 days by this ultrasound. Please note fetal heart rate  of 98 beats per minute.  No evidence of subchorionic hemorrhage.  Original Report Authenticated By: Rosendo Gros, M.D.        MAU Course  Procedures Pelvic exam.  GC/CHL cultures on 9/10 were negative.  Wet prep showed mod clues. Not treated.  MDM Dr.  Marice Potter consulted.  Lab and U/s results reported.  Discussed patient's desire for medication for her anxiety.  Per Dr. Marice Potter, she may return to Prozac but do not take Xanex.  Discussed with the patient.  Begin prenatal care with the doctor of her choice.    Assessment and Plan  Assessment:  Viable intrauterine pregnancy                         Hx of narcotic use                         Hx of anxiety                         Bacterial vaginosis  P:  Begin prenatal care with the doctor of her choice      Instructed that she needs to see the doctor that Rx her Prozac and that she can return to it.       Instructed not to use Xanex Begin prenatal care. Rx written for Flagyl  Toria Monte,EVE M 05/17/2011, 3:04 PM   Matt Holmes, NP 05/17/11 1814

## 2011-05-17 NOTE — ED Notes (Signed)
Pt. Given D/C instrs. By Lynder Parents, NP. Questions answered. Pt. To lobby to await dinner tray. Then will leave hospital.

## 2011-05-17 NOTE — Progress Notes (Signed)
Pt to MAU for a repeat ultrasound. Pt states she has some spotting off and on, none now and cramping off and on, none now.

## 2011-06-20 ENCOUNTER — Emergency Department (HOSPITAL_COMMUNITY): Payer: Medicaid Other

## 2011-06-20 ENCOUNTER — Encounter (HOSPITAL_COMMUNITY): Payer: Self-pay | Admitting: Emergency Medicine

## 2011-06-20 ENCOUNTER — Emergency Department (HOSPITAL_COMMUNITY)
Admission: EM | Admit: 2011-06-20 | Discharge: 2011-06-20 | Disposition: A | Discharge status: Home / Self Care | Payer: Medicaid Other | Attending: Emergency Medicine | Admitting: Emergency Medicine

## 2011-06-20 DIAGNOSIS — O200 Threatened abortion: Secondary | ICD-10-CM | POA: Insufficient documentation

## 2011-06-20 DIAGNOSIS — Z8614 Personal history of Methicillin resistant Staphylococcus aureus infection: Secondary | ICD-10-CM | POA: Insufficient documentation

## 2011-06-20 DIAGNOSIS — F17200 Nicotine dependence, unspecified, uncomplicated: Secondary | ICD-10-CM | POA: Insufficient documentation

## 2011-06-20 DIAGNOSIS — Z87442 Personal history of urinary calculi: Secondary | ICD-10-CM | POA: Insufficient documentation

## 2011-06-20 LAB — URINALYSIS, ROUTINE W REFLEX MICROSCOPIC
Hgb urine dipstick: NEGATIVE
Leukocytes, UA: NEGATIVE
Specific Gravity, Urine: 1.03 (ref 1.005–1.030)
Urobilinogen, UA: 0.2 mg/dL (ref 0.0–1.0)

## 2011-06-20 LAB — TYPE AND SCREEN
ABO/RH(D): O POS
Antibody Screen: NEGATIVE

## 2011-06-20 LAB — DIFFERENTIAL
Basophils Relative: 0 % (ref 0–1)
Eosinophils Absolute: 0.2 10*3/uL (ref 0.0–0.7)
Lymphs Abs: 3.1 10*3/uL (ref 0.7–4.0)
Monocytes Absolute: 0.8 10*3/uL (ref 0.1–1.0)
Monocytes Relative: 6 % (ref 3–12)
Neutro Abs: 10.3 10*3/uL — ABNORMAL HIGH (ref 1.7–7.7)
Neutrophils Relative %: 72 % (ref 43–77)

## 2011-06-20 LAB — CBC
HCT: 36.3 % (ref 36.0–46.0)
Hemoglobin: 12.5 g/dL (ref 12.0–15.0)
MCH: 30.6 pg (ref 26.0–34.0)
MCHC: 34.4 g/dL (ref 30.0–36.0)
RBC: 4.09 MIL/uL (ref 3.87–5.11)

## 2011-06-20 LAB — ABO/RH

## 2011-06-20 LAB — RPR: RPR: NONREACTIVE

## 2011-06-20 LAB — URINE MICROSCOPIC-ADD ON

## 2011-06-20 MED ORDER — HYDROMORPHONE HCL 1 MG/ML IJ SOLN
INTRAMUSCULAR | Status: AC
  Filled 2011-06-20: qty 1

## 2011-06-20 MED ORDER — HYDROMORPHONE HCL 1 MG/ML IJ SOLN
1.0000 mg | Freq: Once | INTRAMUSCULAR | Status: AC
  Administered 2011-06-20: 1 mg via INTRAMUSCULAR

## 2011-06-20 MED ORDER — MORPHINE SULFATE 4 MG/ML IJ SOLN
4.0000 mg | Freq: Once | INTRAMUSCULAR | Status: AC
  Administered 2011-06-20: 4 mg via INTRAVENOUS
  Filled 2011-06-20: qty 1

## 2011-06-20 NOTE — ED Notes (Signed)
Pt at US at this time

## 2011-06-20 NOTE — ED Notes (Signed)
Pt states pain med did not help. edp aware

## 2011-06-20 NOTE — ED Provider Notes (Signed)
History   This chart was scribed for Benny Lennert, MD by Clarita Crane. The patient was seen in room APA19/APA19 and the patient's care was started at 8:05AM.   CSN: [id number] Arrival date & time: 06/20/2011  7:58 AM   First MD Initiated Contact with Patient 06/20/11 0802      Chief Complaint  Patient presents with  . Abdominal Pain     [redacted] wks pregnant   HPI Rebecca [name] is a 30 y.o. female that is [redacted] weeks pregnant verified via ultrasound who presents to the Emergency Department complaining of constant sharp suprapubic abdominal pain onset 1 week ago and worse yesterday with associated nausea and mild vaginal bleeding which was brown in color initially and progressed to a blood red color. Patient reports pain became worse yesterday when she experienced an episode she describes as lower abdominal "tightening" Denies vomiting, diarrhea. G-2 P-1 Ab-0.  HPI ELEMENTS:  Location: suprapubic region  Onset: 1 week ago Duration: persistent since onset but worse yesterday  Timing: constant  Quality: sharp   Context:  as above  Associated symptoms: +nausea, vaginal bleeding. Denies vomiting, diarrhea.     Past Medical History  Diagnosis Date  . Kidney stone   . Kidney stone   . MRSA (methicillin resistant staph aureus) culture positive   . 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 Anxiety     currently no meds    Past Surgical History  Procedure Date  . Wisdom tooth extraction 1995    History reviewed. No pertinent family history.  History  Substance Use Topics  . Smoking status: Current Everyday Smoker -- 0.5 packs/day for 12 years    Types: Cigarettes  . Smokeless tobacco: Not on file  . Alcohol Use: No    OB History    Grav Para Term Preterm Abortions TAB SAB Ect Mult Living   2 1 1  0 0 0 0 0 0 1      Review of Systems  Constitutional: Negative for fatigue.  HENT: Negative for congestion, sinus pressure and ear discharge.   Eyes: Negative  for discharge.  Respiratory: Negative for cough.   Cardiovascular: Negative for chest pain.  Gastrointestinal: Positive for nausea and abdominal pain. Negative for vomiting and diarrhea.  Genitourinary: Positive for vaginal bleeding. Negative for frequency and hematuria.  Musculoskeletal: Negative for back pain.  Skin: Negative for rash.  Neurological: Negative for seizures and headaches.  Hematological: Negative.   Psychiatric/Behavioral: Negative for hallucinations.    Allergies  Citalopram hydrobromide; Propoxyphene n-acetaminophen; and Tramadol  Home Medications   Current Outpatient Rx  Name Route Sig Dispense Refill  . PRENATAL 27-0.8 MG PO TABS Oral Take 1 tablet by mouth daily.        BP 127/69  Pulse 75  Temp(Src) 98.9 F (37.2 C) (Oral)  Resp 18  Ht 5\' 8"  (1.727 m)  Wt 185 lb (83.915 kg)  BMI 28.13 kg/m2  SpO2 100%  LMP 03/24/2011  Physical Exam  Nursing note and vitals reviewed. Constitutional: She is oriented to person, place, and time. She appears well-developed and well-nourished. No distress.  HENT:  Head: Normocephalic and atraumatic.  Eyes: Conjunctivae and EOM are normal. No scleral icterus.  Neck: Neck supple.  Cardiovascular: Normal rate, regular rhythm and normal heart sounds.  Exam reveals no gallop and no friction rub.   No murmur heard. Pulmonary/Chest: Effort normal. No stridor. No respiratory distress. She has no wheezes. She has no  rales.  Abdominal: Soft. She exhibits no distension. There is tenderness in the suprapubic area. There is no rebound.  Genitourinary:       Chaperone present for pelvic exam. No gross blood noted, tenderness to cervix and uterus, no adnexal masses felt.   Musculoskeletal: Normal range of motion. She exhibits no edema.  Neurological: She is alert and oriented to person, place, and time.  Skin: Skin is dry.  Psychiatric: She has a normal mood and affect. Her behavior is normal.    ED Course  Procedures (including  critical care time)  DIAGNOSTIC STUDIES:  COORDINATION OF CARE: 9:43AM- Nurse informs physician that patient's abdominal pain not relieved with administration of Morphine-4mg .  10:11AM- Pelvic exam performed by EDP.  12:00PM- Patient informed of lab and imaging results. Patient notes she is not currently followed by OB due to lack of insurance and waiting to be approved for medicaid. Patient notes pain has improved after having an episode in which pain worsened.  12:15PM- Consult with Dr. Emelda Fear complete. Dr. Emelda Fear agrees to see patient at [facility] today. Will d/c patient at this time.   Labs Reviewed  CBC - Abnormal; Notable for the following:    WBC 14.4 (*)    All other components within normal limits  DIFFERENTIAL - Abnormal; Notable for the following:    Neutro Abs 10.3 (*)    All other components within normal limits  HCG, QUANTITATIVE, PREGNANCY - Abnormal; Notable for the following:    hCG, Beta Chain, Quant, S 16109 (*)    All other components within normal limits  URINALYSIS, ROUTINE W REFLEX MICROSCOPIC - Abnormal; Notable for the following:    Protein, ur TRACE (*)    All other components within normal limits  URINE MICROSCOPIC-ADD ON - Abnormal; Notable for the following:    Squamous Epithelial / LPF FEW (*)    Bacteria, UA FEW (*)    All other components within normal limits  TYPE AND SCREEN   US Ob Comp Less 14 Wks  06/20/2011  *RADIOLOGY REPORT*  Clinical Data: Abnormal bleeding; EGA by LMP is 12 weeks 4 days  OBSTETRIC <14 WK [place] AND TRANSVAGINAL OB US  Technique:  Both transabdominal and transvaginal ultrasound examinations were performed for complete evaluation of the gestation as well as the maternal uterus, adnexal regions, and pelvic cul-de-sac.  Transvaginal technique was performed to assess early pregnancy.  Comparison:  None.  Intrauterine gestational sac:  Visualized/normal in shape. Yolk sac: Yes Embryo: Yes Cardiac Activity: Yes Heart Rate: 153 bpm  CRL: 45  mm    11   w  2   d         [place] [facility]: Jan 07, 2012  Maternal uterus/adnexae: There is a small subacute subchorionic hemorrhage.  Both ovaries have a normal appearance.  IMPRESSION: There is a single living intrauterine gestation with crown rump length indicating a gestational age of [redacted] weeks 2 days which is 1 week 2 days younger than estimated by LMP.  Full anatomy scan at 19 - 21 weeks is recommended.  Original Report Authenticated By: Brandon Melnick, M.D.   US Ob Transvaginal  06/20/2011  *RADIOLOGY REPORT*  Clinical Data: Abnormal bleeding; EGA by LMP is 12 weeks 4 days  OBSTETRIC <14 WK [place] AND TRANSVAGINAL OB US  Technique:  Both transabdominal and transvaginal ultrasound examinations were performed for complete evaluation of the gestation as well as the maternal uterus, adnexal regions, and pelvic cul-de-sac.  Transvaginal technique was performed to  assess early pregnancy.  Comparison:  None.  Intrauterine gestational sac:  Visualized/normal in shape. Yolk sac: Yes Embryo: Yes Cardiac Activity: Yes Heart Rate: 153 bpm  CRL: 45 mm    11   w  2   d         [place] [facility]: Jan 07, 2012  Maternal uterus/adnexae: There is a small subacute subchorionic hemorrhage.  Both ovaries have a normal appearance.  IMPRESSION: There is a single living intrauterine gestation with crown rump length indicating a gestational age of [redacted] weeks 2 days which is 1 week 2 days younger than estimated by LMP.  Full anatomy scan at 19 - 21 weeks is recommended.  Original Report Authenticated By: Brandon Melnick, M.D.     1. Threatened abortion       MDM       The chart was scribed for me under my direct supervision.  I personally performed the history, physical, and medical decision making and all procedures in the evaluation of this patient.Benny Lennert, MD 06/20/11 [phone]

## 2011-06-20 NOTE — ED Notes (Signed)
Pt states she is approx [redacted] wks pregnant. Has not seen OB yet. Prenatal vit only for first 8 weeks or so per pt. Pt c/o intermittant sharp, tightening pains to lower abd x 1 wk that worsened yesterday.  Started to see brown, bloody vaginal d/c yesterday. Nausea last night "because of the pain"

## 2011-06-20 NOTE — ED Notes (Signed)
Pt states she is still hurting, she is hungry and hot. Pt made aware that edp was with a critical pt at present and he would need to review her test result before she could have anything to drink

## 2011-07-20 ENCOUNTER — Encounter (HOSPITAL_COMMUNITY): Payer: Self-pay | Admitting: Emergency Medicine

## 2011-07-20 ENCOUNTER — Emergency Department (HOSPITAL_COMMUNITY)
Admission: EM | Admit: 2011-07-20 | Discharge: 2011-07-20 | Disposition: A | Discharge status: Home / Self Care | Payer: Self-pay | Attending: Emergency Medicine | Admitting: Emergency Medicine

## 2011-07-20 DIAGNOSIS — Z87442 Personal history of urinary calculi: Secondary | ICD-10-CM | POA: Insufficient documentation

## 2011-07-20 DIAGNOSIS — F17200 Nicotine dependence, unspecified, uncomplicated: Secondary | ICD-10-CM | POA: Insufficient documentation

## 2011-07-20 DIAGNOSIS — K047 Periapical abscess without sinus: Secondary | ICD-10-CM | POA: Insufficient documentation

## 2011-07-20 DIAGNOSIS — Z8614 Personal history of Methicillin resistant Staphylococcus aureus infection: Secondary | ICD-10-CM | POA: Insufficient documentation

## 2011-07-20 DIAGNOSIS — K0381 Cracked tooth: Secondary | ICD-10-CM | POA: Insufficient documentation

## 2011-07-20 DIAGNOSIS — F489 Nonpsychotic mental disorder, unspecified: Secondary | ICD-10-CM | POA: Insufficient documentation

## 2011-07-20 MED ORDER — HYDROCODONE-ACETAMINOPHEN 5-325 MG PO TABS: 1.0000 | ORAL_TABLET | ORAL | Status: AC | PRN

## 2011-07-20 MED ORDER — PENICILLIN V POTASSIUM 500 MG PO TABS: 500.0000 mg | ORAL_TABLET | Freq: Four times a day (QID) | ORAL | Status: AC

## 2011-07-20 NOTE — ED Notes (Signed)
Patient c/o upper left dental pain x2-3 days. Per patient tooth chipped, supposed to have work done on it but are having to wait for insurance to start.

## 2011-07-20 NOTE — ED Provider Notes (Signed)
Medical screening examination/treatment/procedure(s) were performed by non-physician practitioner and as supervising physician I was immediately available for consultation/collaboration.  Emmarae Cowdery, MD 07/20/11 1248 

## 2011-07-20 NOTE — ED Provider Notes (Signed)
History     CSN: [id number] Arrival date & time: 07/20/2011  8:52 AM   First MD Initiated Contact with Patient 07/20/11 [phone]      Chief Complaint  Patient presents with  . Dental Pain    (Consider location/radiation/quality/duration/timing/severity/associated sxs/prior treatment) Patient is a 30 y.o. female presenting with tooth pain. The history is provided by the patient.  Dental PainThe primary symptoms include mouth pain. Primary symptoms do not include headaches, fever, shortness of breath or sore throat. Primary symptoms comment: She reports increased pain in a tooth that has been crumbling forr the past week.  The symptoms began 2 days ago. The symptoms are worsening. The symptoms are new. The symptoms occur constantly.  Additional symptoms include: dental sensitivity to temperature and gum tenderness. Additional symptoms do not include: gum swelling, purulent gums and ear pain. Associated symptoms comments: She has had low grade fever to 100.. Associated medical issues comments: She is currently [redacted] weeks pregnant..    Past Medical History  Diagnosis Date  . Kidney stone   . Kidney stone   . MRSA (methicillin resistant staph aureus) culture positive   . 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 Anxiety     currently no meds    Past Surgical History  Procedure Date  . Wisdom tooth extraction 1995    History reviewed. No pertinent family history.  History  Substance Use Topics  . Smoking status: Current Everyday Smoker -- 0.5 packs/day for 12 years    Types: Cigarettes  . Smokeless tobacco: Never Used  . Alcohol Use: No    OB History    Grav Para Term Preterm Abortions TAB SAB Ect Mult Living   2 1 1  0 0 0 0 0 0 1      Review of Systems  Constitutional: Negative for fever.  HENT: Positive for dental problem. Negative for ear pain, congestion, sore throat and neck pain.   Eyes: Negative.   Respiratory: Negative for chest tightness and  shortness of breath.   Cardiovascular: Negative for chest pain.  Gastrointestinal: Negative for nausea and abdominal pain.  Genitourinary: Negative.   Musculoskeletal: Negative for joint swelling and arthralgias.  Skin: Negative.  Negative for rash and wound.  Neurological: Negative for dizziness, weakness, light-headedness, numbness and headaches.  Hematological: Negative.   Psychiatric/Behavioral: Negative.     Allergies  Citalopram hydrobromide; Propoxyphene n-acetaminophen; and Tramadol  Home Medications   Current Outpatient Rx  Name Route Sig Dispense Refill  . PRENATAL 27-0.8 MG PO TABS Oral Take 1 tablet by mouth daily.        BP 146/91  Pulse 88  Temp 98.9 F (37.2 C)  Resp 18  Ht 5\' 8"  (1.727 m)  Wt 185 lb (83.915 kg)  BMI 28.13 kg/m2  SpO2 100%  LMP 03/24/2011  Physical Exam  Constitutional: She is oriented to person, place, and time. She appears well-developed and well-nourished. No distress.  HENT:  Head: Normocephalic and atraumatic.  Right Ear: Tympanic membrane and external ear normal.  Left Ear: Tympanic membrane and external ear normal.  Mouth/Throat: Oropharynx is clear and moist and mucous membranes are normal. No oral lesions. Dental abscesses present.    Eyes: Conjunctivae are normal.  Neck: Normal range of motion. Neck supple.  Cardiovascular: Normal rate and normal heart sounds.   Pulmonary/Chest: Effort normal.  Abdominal: She exhibits no distension.  Musculoskeletal: Normal range of motion.  Lymphadenopathy:    She has no cervical  adenopathy.  Neurological: She is alert and oriented to person, place, and time.  Skin: Skin is warm and dry. No erythema.  Psychiatric: She has a normal mood and affect.    ED Course  Procedures (including critical care time)  Labs Reviewed - No data to display No results found.   No diagnosis found.    MDM  Dental fracture with possible early abscess.  PCN,  Hydrocodone.  Dental referrals  given.        Candis Musa, PA 07/20/11 1191  Candis Musa, PA 07/20/11 0940  Candis Musa, PA 07/20/11 4782  Candis Musa, PA 07/20/11 [phone]

## 2011-07-25 ENCOUNTER — Other Ambulatory Visit (HOSPITAL_COMMUNITY)
Admission: RE | Admit: 2011-07-25 | Discharge: 2011-07-25 | Disposition: A | Discharge status: Home / Self Care | Payer: Medicaid Other | Source: Ambulatory Visit | Attending: Obstetrics and Gynecology | Admitting: Obstetrics and Gynecology

## 2011-07-25 ENCOUNTER — Other Ambulatory Visit: Payer: Self-pay | Admitting: Obstetrics and Gynecology

## 2011-07-25 DIAGNOSIS — R87810 Cervical high risk human papillomavirus (HPV) DNA test positive: Secondary | ICD-10-CM | POA: Insufficient documentation

## 2011-07-25 DIAGNOSIS — Z113 Encounter for screening for infections with a predominantly sexual mode of transmission: Secondary | ICD-10-CM | POA: Insufficient documentation

## 2011-07-25 DIAGNOSIS — Z01419 Encounter for gynecological examination (general) (routine) without abnormal findings: Secondary | ICD-10-CM | POA: Insufficient documentation

## 2011-08-17 ENCOUNTER — Encounter (HOSPITAL_COMMUNITY): Payer: Self-pay | Admitting: *Deleted

## 2011-08-17 ENCOUNTER — Emergency Department (HOSPITAL_COMMUNITY)
Admission: EM | Admit: 2011-08-17 | Discharge: 2011-08-17 | Disposition: A | Discharge status: Home / Self Care | Payer: Self-pay | Attending: Emergency Medicine | Admitting: Emergency Medicine

## 2011-08-17 DIAGNOSIS — F411 Generalized anxiety disorder: Secondary | ICD-10-CM | POA: Insufficient documentation

## 2011-08-17 DIAGNOSIS — F17200 Nicotine dependence, unspecified, uncomplicated: Secondary | ICD-10-CM | POA: Insufficient documentation

## 2011-08-17 DIAGNOSIS — R220 Localized swelling, mass and lump, head: Secondary | ICD-10-CM | POA: Insufficient documentation

## 2011-08-17 DIAGNOSIS — R221 Localized swelling, mass and lump, neck: Secondary | ICD-10-CM | POA: Insufficient documentation

## 2011-08-17 DIAGNOSIS — K0889 Other specified disorders of teeth and supporting structures: Secondary | ICD-10-CM

## 2011-08-17 DIAGNOSIS — Z87442 Personal history of urinary calculi: Secondary | ICD-10-CM | POA: Insufficient documentation

## 2011-08-17 DIAGNOSIS — R6884 Jaw pain: Secondary | ICD-10-CM | POA: Insufficient documentation

## 2011-08-17 DIAGNOSIS — K089 Disorder of teeth and supporting structures, unspecified: Secondary | ICD-10-CM | POA: Insufficient documentation

## 2011-08-17 MED ORDER — HYDROCODONE-ACETAMINOPHEN 5-325 MG PO TABS: ORAL_TABLET | ORAL | Status: DC

## 2011-08-17 MED ORDER — AMOXICILLIN 500 MG PO CAPS: ORAL_CAPSULE | ORAL | Status: DC

## 2011-08-17 NOTE — ED Provider Notes (Signed)
Medical screening examination/treatment/procedure(s) were performed by non-physician practitioner and as supervising physician I was immediately available for consultation/collaboration.  Kadyn Chovan, MD 08/17/11 1354 

## 2011-08-17 NOTE — ED Notes (Signed)
Dental pain ,lt upper molar.  Pt is pregnant, has not see OB yet, says she is waiting on medicaid.

## 2011-08-17 NOTE — ED Notes (Signed)
Pt states dental pain x 2 weeks. Became worse again yesterday. Pain to left upper molar.

## 2011-08-17 NOTE — ED Provider Notes (Signed)
History     CSN: [id number] Arrival date & time: 08/17/2011 10:56 AM   None     Chief Complaint  Patient presents with  . Dental Pain    (Consider location/radiation/quality/duration/timing/severity/associated sxs/prior treatment) Patient is a 30 y.o. female presenting with tooth pain. The history is provided by the patient.  Dental PainThe primary symptoms include mouth pain and headaches. The symptoms began more than 1 week ago. The symptoms are worsening. The symptoms occur frequently.  Additional symptoms include: gum swelling and jaw pain. Medical issues include: smoking.    Past Medical History  Diagnosis Date  . Kidney stone   . Kidney stone   . MRSA (methicillin resistant staph aureus) culture positive   . 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 Anxiety     currently no meds    Past Surgical History  Procedure Date  . Wisdom tooth extraction 1995    No family history on file.  History  Substance Use Topics  . Smoking status: Current Everyday Smoker -- 0.5 packs/day for 12 years    Types: Cigarettes  . Smokeless tobacco: Never Used  . Alcohol Use: No    OB History    Grav Para Term Preterm Abortions TAB SAB Ect Mult Living   2 1 1  0 0 0 0 0 0 1      Review of Systems  HENT: Positive for dental problem.   Neurological: Positive for headaches.  Psychiatric/Behavioral: The patient is nervous/anxious.     Allergies  Citalopram hydrobromide; Propoxyphene n-acetaminophen; and Tramadol  Home Medications   Current Outpatient Rx  Name Route Sig Dispense Refill  . ACETAMINOPHEN 500 MG PO TABS Oral Take 1,000 mg by mouth every 6 (six) hours as needed. For pain    . PRENATAL 27-0.8 MG PO TABS Oral Take 1 tablet by mouth daily.        BP 130/67  Pulse 94  Temp(Src) 98.8 F (37.1 C) (Oral)  Resp 16  Ht 5\' 8"  (1.727 m)  Wt 191 lb (86.637 kg)  BMI 29.04 kg/m2  SpO2 100%  LMP 03/24/2011  Physical Exam  Nursing note and vitals  reviewed. Constitutional: She is oriented to person, place, and time. She appears well-developed and well-nourished.  Non-toxic appearance.  HENT:  Head: Normocephalic.  Right Ear: Tympanic membrane and external ear normal.  Left Ear: Tympanic membrane and external ear normal.       There is question of a small chip on the first molar. There is mild swelling of the above the molars. No visible abscess.  Eyes: EOM and lids are normal. Pupils are equal, round, and reactive to light.  Neck: Normal range of motion. Neck supple. Carotid bruit is not present.  Cardiovascular: Normal rate, regular rhythm, normal heart sounds, intact distal pulses and normal pulses.   Pulmonary/Chest: Breath sounds normal. No respiratory distress.  Abdominal: Soft. Bowel sounds are normal. There is no tenderness. There is no guarding.  Musculoskeletal: Normal range of motion.  Lymphadenopathy:       Head (right side): No submandibular adenopathy present.       Head (left side): No submandibular adenopathy present.    She has no cervical adenopathy.  Neurological: She is alert and oriented to person, place, and time. She has normal strength. No cranial nerve deficit or sensory deficit.  Skin: Skin is warm and dry.  Psychiatric: She has a normal mood and affect. Her speech is normal.  ED Course  Procedures (including critical care time) Pulse oximetry 100% on room air. Within normal limits by my interpretation. Labs Reviewed - No data to display No results found.   Dx: Toothache   MDM  I have reviewed nursing notes, vital signs, and all appropriate lab and imaging results for this patient.        Kathie Dike, [place] 08/17/11 1218

## 2011-09-05 ENCOUNTER — Encounter (HOSPITAL_COMMUNITY): Payer: Self-pay | Admitting: Emergency Medicine

## 2011-09-05 ENCOUNTER — Emergency Department (HOSPITAL_COMMUNITY)
Admission: EM | Admit: 2011-09-05 | Discharge: 2011-09-05 | Disposition: A | Discharge status: Home / Self Care | Payer: Self-pay | Attending: Emergency Medicine | Admitting: Emergency Medicine

## 2011-09-05 ENCOUNTER — Encounter (HOSPITAL_COMMUNITY): Payer: Self-pay | Admitting: *Deleted

## 2011-09-05 DIAGNOSIS — F17200 Nicotine dependence, unspecified, uncomplicated: Secondary | ICD-10-CM | POA: Insufficient documentation

## 2011-09-05 DIAGNOSIS — Z87442 Personal history of urinary calculi: Secondary | ICD-10-CM | POA: Insufficient documentation

## 2011-09-05 DIAGNOSIS — K089 Disorder of teeth and supporting structures, unspecified: Secondary | ICD-10-CM | POA: Insufficient documentation

## 2011-09-05 DIAGNOSIS — F411 Generalized anxiety disorder: Secondary | ICD-10-CM | POA: Insufficient documentation

## 2011-09-05 DIAGNOSIS — K029 Dental caries, unspecified: Secondary | ICD-10-CM | POA: Insufficient documentation

## 2011-09-05 DIAGNOSIS — O99891 Other specified diseases and conditions complicating pregnancy: Secondary | ICD-10-CM | POA: Insufficient documentation

## 2011-09-05 DIAGNOSIS — K0889 Other specified disorders of teeth and supporting structures: Secondary | ICD-10-CM

## 2011-09-05 MED ORDER — OXYCODONE-ACETAMINOPHEN 5-325 MG PO TABS
1.0000 | ORAL_TABLET | Freq: Once | ORAL | Status: AC
  Administered 2011-09-05: 1 via ORAL
  Filled 2011-09-05: qty 1

## 2011-09-05 NOTE — ED Provider Notes (Signed)
History     CSN: [id number]  Arrival date & time 09/05/11  1205   None     No chief complaint on file.   (Consider location/radiation/quality/duration/timing/severity/associated sxs/prior treatment) HPI  Patient who is [redacted] weeks pregnant who has been seen at [facility]  at end of Nov and in Dec for toothache and given narcotic pain relief returns to ER complaining of ongoing left upper molar pain that she states she has run out of hydrocodone-acetaminophen and therefore has been taking tylenol without relief of pain. Patient states pain is constant and aggravated by chewing and cold air. Denies fevers, chills, facial swelling, difficulty breahing. Patient has hx of narcotic pain abuse. Patient states she has yet to follow up with dentist despite given dental referral in the past stating "my medicaid card has not come yet but it should be here any time now."   Past Medical History  Diagnosis Date  . Kidney stone   . Kidney stone   . MRSA (methicillin resistant staph aureus) culture positive   . 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 Anxiety     currently no meds    Past Surgical History  Procedure Date  . Wisdom tooth extraction 1995    No family history on file.  History  Substance Use Topics  . Smoking status: Current Everyday Smoker -- 0.5 packs/day for 12 years    Types: Cigarettes  . Smokeless tobacco: Never Used  . Alcohol Use: No    OB History    Grav Para Term Preterm Abortions TAB SAB Ect Mult Living   2 1 1  0 0 0 0 0 0 1      Review of Systems  All other systems reviewed and are negative.    Allergies  Citalopram hydrobromide; Propoxyphene n-acetaminophen; and Tramadol  Home Medications   Current Outpatient Rx  Name Route Sig Dispense Refill  . ACETAMINOPHEN 500 MG PO TABS Oral Take 1,000 mg by mouth every 6 (six) hours as needed. For pain    . PRENATAL 27-0.8 MG PO TABS Oral Take 1 tablet by mouth daily.        LMP  03/24/2011  Physical Exam  Nursing note and vitals reviewed. Constitutional: She is oriented to person, place, and time. She appears well-developed and well-nourished.  HENT:  Head: Normocephalic and atraumatic.       Good dentition without evidence of dental decay of left upper 1st molar. Normal gingiva. TTP of tooth. Patent airway.   Eyes: Conjunctivae are normal.  Neck: Normal range of motion. Neck supple.  Cardiovascular: Normal rate and regular rhythm.   Pulmonary/Chest: Effort normal and breath sounds normal.  Musculoskeletal: Normal range of motion.  Lymphadenopathy:    She has no cervical adenopathy.  Neurological: She is alert and oriented to person, place, and time.  Skin: Skin is warm and dry.  Psychiatric: She has a normal mood and affect. Her behavior is normal.    ED Course  Procedures (including critical care time)  PO percocet x 1.   Labs Reviewed - No data to display No results found.   1. Pain, dental       MDM  Patient with hx of narcotic pain medication abuse who is [redacted] weeks pregnant complaining of left upper molar dental pain and seen in ER numerous times for dental pain but without evident fracture of cavity of tooth. Patient has yet to follow up with dentistry though she  states she is agreeable to close follow up. Patient instructed how to take tylenol for pain with topical analgesics for additional relief but to follow up with dentist. She was given dental referral once again. Spoke at length with patient about the possible side effects and complications in pregnancy of repetitive narcotic use during pregnancy.         [place]ness [place], [place] 09/05/11 1536

## 2011-09-05 NOTE — ED Provider Notes (Signed)
History   This chart was scribed for Ethelda Chick, MD scribed by Magnus Sinning. The patient was seen in room APA10/APA10 seen at 18:46.   CSN: [id number]  Arrival date & time 09/05/11  1740   First MD Initiated Contact with Patient 09/05/11 1836      Chief Complaint  Patient presents with  . Dental Pain    (Consider location/radiation/quality/duration/timing/severity/associated sxs/prior treatment) HPI Rebecca [name] is a 31 y.o. female who presents to the Emergency Department complaining of gradually worsening moderate upper left molar dental pain onset two weeks ago.  Pt was seen earlier at [facility] today for the same pain. Pt reports that she has not seen a dentist, due to medicaid eligibility needing update. However, pt does add that she made an appt for this Thursday.  Pt states pain is constant, worse at night, throbbing in nature.  Pt has been taking tylenol without much relief of pain.  No fevers, no swelling of tongue or face, no difficulty swallowing or breathing.  Pt states she called her dentist today and has appointment on Thursday at 2:00pm but will not be able to afford if medicaid doesn't come in.    Past Medical History  Diagnosis Date  . Kidney stone   . Kidney stone   . MRSA (methicillin resistant staph aureus) culture positive   . 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 Anxiety     currently no meds    Past Surgical History  Procedure Date  . Wisdom tooth extraction 1995    Family History  Problem Relation Age of Onset  . Hypertension Mother     History  Substance Use Topics  . Smoking status: Current Everyday Smoker -- 0.5 packs/day for 12 years    Types: Cigarettes  . Smokeless tobacco: Never Used  . Alcohol Use: No    OB History    Grav Para Term Preterm Abortions TAB SAB Ect Mult Living   2 1 1  0 0 0 0 0 0 1      Review of Systems 10 Systems reviewed and are negative for acute change except as noted in the  HPI. Allergies  Citalopram hydrobromide; Propoxyphene n-acetaminophen; and Tramadol  Home Medications   Current Outpatient Rx  Name Route Sig Dispense Refill  . ACETAMINOPHEN 500 MG PO TABS Oral Take 1,000 mg by mouth every 6 (six) hours as needed. For pain    . PRENATAL 27-0.8 MG PO TABS Oral Take 1 tablet by mouth daily.        BP 128/70  Pulse 84  Temp 98.2 F (36.8 C)  Resp 20  Ht 5\' 8"  (1.727 m)  Wt 190 lb (86.183 kg)  BMI 28.89 kg/m2  SpO2 100%  LMP 03/24/2011  Physical Exam  Nursing note and vitals reviewed. Constitutional: She is oriented to person, place, and time. She appears well-developed and well-nourished. No distress.  HENT:  Head: Normocephalic and atraumatic.       No periapical abscess or obvious decay. No swelling or infection of the gum.   Eyes: EOM are normal. Pupils are equal, round, and reactive to light.  Neck: Neck supple. No tracheal deviation present.  Cardiovascular: Normal rate, regular rhythm and normal heart sounds.   Pulmonary/Chest: Effort normal and breath sounds normal. No respiratory distress.  Abdominal: Soft. Bowel sounds are normal. She exhibits no distension.       Gravid abd  Musculoskeletal: Normal range of motion. She  exhibits no edema.  Neurological: She is alert and oriented to person, place, and time. No sensory deficit.  Skin: Skin is warm and dry.  Psychiatric: She has a normal mood and affect. Her behavior is normal.    ED Course  Procedures (including critical care time) DIAGNOSTIC STUDIES: Oxygen Saturation is 100% on room air, normal by my interpretation.    COORDINATION OF CARE:    Labs Reviewed - No data to display No results found.   1. Pain, dental       MDM  Pt with dental pain, no caries or abscess on exam today.  Pt is [redacted] weeks pregnant with hx of narcotic abuse in the past.  Pt was seen earlier today at [facility] for same complaint.  Long d/w patient about the reasons for not given narcotic  medications and that she should continue to avoid ibuprofen.  Pt strongly encouraged to continue to work with her dentist to arrange for appointment/payment plan/medicaid, etc.  Pt verbalized understanding of this plan.  Discharged with strict return precautions.  Pt agreeable with plan.  I personally performed the services described in this documentation, which was scribed in my presence. The recorded information has been reviewed and considered.         Ethelda Chick, MD 09/05/11 Ebony Cargo

## 2011-09-05 NOTE — ED Provider Notes (Signed)
Medical screening examination/treatment/procedure(s) were performed by non-physician practitioner and as supervising physician I was immediately available for consultation/collaboration.  Guadalupe Kerekes, MD 09/05/11 1615 

## 2011-09-05 NOTE — ED Notes (Signed)
persistant dental pain. Pt has not been able to see a dentist

## 2011-09-05 NOTE — ED Notes (Signed)
Dental pain left upper jaw, also is pregnant, states she was seen at [facility] today for same and was not given any meds

## 2011-09-26 ENCOUNTER — Encounter (HOSPITAL_COMMUNITY): Payer: Self-pay

## 2011-09-26 ENCOUNTER — Emergency Department (HOSPITAL_COMMUNITY)
Admission: EM | Admit: 2011-09-26 | Discharge: 2011-09-26 | Discharge status: Against Medical Advice | Payer: Self-pay | Attending: Emergency Medicine | Admitting: Emergency Medicine

## 2011-09-26 DIAGNOSIS — J3489 Other specified disorders of nose and nasal sinuses: Secondary | ICD-10-CM | POA: Insufficient documentation

## 2011-09-26 DIAGNOSIS — K089 Disorder of teeth and supporting structures, unspecified: Secondary | ICD-10-CM | POA: Insufficient documentation

## 2011-09-26 DIAGNOSIS — R0981 Nasal congestion: Secondary | ICD-10-CM

## 2011-09-26 NOTE — ED Provider Notes (Signed)
History     CSN: [id number]  Arrival date & time 09/26/11  7829   First MD Initiated Contact with Patient 09/26/11 1058      Chief Complaint  Patient presents with  . Nasal Congestion    (Consider location/radiation/quality/duration/timing/severity/associated sxs/prior treatment) HPI  Past Medical History  Diagnosis Date  . Kidney stone   . Kidney stone   . MRSA (methicillin resistant staph aureus) culture positive   . 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 Anxiety     currently no meds    Past Surgical History  Procedure Date  . Wisdom tooth extraction 1995    Family History  Problem Relation Age of Onset  . Hypertension Mother     History  Substance Use Topics  . Smoking status: Current Everyday Smoker -- 0.5 packs/day for 12 years    Types: Cigarettes  . Smokeless tobacco: Never Used  . Alcohol Use: No    OB History    Grav Para Term Preterm Abortions TAB SAB Ect Mult Living   2 1 1  0 0 0 0 0 0 1      Review of Systems  Allergies  Citalopram hydrobromide; Propoxyphene n-acetaminophen; and Tramadol  Home Medications   Current Outpatient Rx  Name Route Sig Dispense Refill  . ACETAMINOPHEN 500 MG PO TABS Oral Take 1,000 mg by mouth every 6 (six) hours as needed. For pain    . PRENATAL 27-0.8 MG PO TABS Oral Take 1 tablet by mouth daily.        BP 133/66  Pulse 88  Temp(Src) 98.2 F (36.8 C) (Oral)  Resp 20  Ht 5\' 8"  (1.727 m)  Wt 192 lb (87.091 kg)  BMI 29.19 kg/m2  SpO2 100%  LMP 03/24/2011  Physical Exam  ED Course  Procedures (including critical care time)  Labs Reviewed - No data to display No results found.   No diagnosis found.    MDM   1118-pt not in exam room   1215-pt still not in room.  ? eloped   Pt left without being seen.  Worthy Rancher, PA 10/22/11 [phone]

## 2011-09-26 NOTE — ED Notes (Signed)
Complain of nasal congestion. Also, dental pain that she has had for a long time

## 2011-09-26 NOTE — ED Notes (Signed)
Pt came to nurses station stating she had to step out to make a phone call. Pt never returned

## 2011-09-26 NOTE — ED Notes (Signed)
Pt not in room X1

## 2011-10-24 NOTE — ED Provider Notes (Signed)
Medical screening examination/treatment/procedure(s) were performed by non-physician practitioner and as supervising physician I was immediately available for consultation/collaboration.   Dione Booze, MD 10/24/11 [phone]

## 2011-12-11 ENCOUNTER — Encounter (HOSPITAL_COMMUNITY): Payer: Self-pay | Admitting: *Deleted

## 2011-12-11 ENCOUNTER — Emergency Department (HOSPITAL_COMMUNITY)
Admission: EM | Admit: 2011-12-11 | Discharge: 2011-12-12 | Discharge status: Against Medical Advice | Payer: Medicaid Other | Attending: Emergency Medicine | Admitting: Emergency Medicine

## 2011-12-11 DIAGNOSIS — F1920 Other psychoactive substance dependence, uncomplicated: Secondary | ICD-10-CM | POA: Insufficient documentation

## 2011-12-11 DIAGNOSIS — F1120 Opioid dependence, uncomplicated: Secondary | ICD-10-CM | POA: Insufficient documentation

## 2011-12-11 DIAGNOSIS — M259 Joint disorder, unspecified: Secondary | ICD-10-CM | POA: Insufficient documentation

## 2011-12-11 DIAGNOSIS — Z3480 Encounter for supervision of other normal pregnancy, unspecified trimester: Secondary | ICD-10-CM

## 2011-12-11 DIAGNOSIS — M899 Disorder of bone, unspecified: Secondary | ICD-10-CM | POA: Insufficient documentation

## 2011-12-11 DIAGNOSIS — Z8614 Personal history of Methicillin resistant Staphylococcus aureus infection: Secondary | ICD-10-CM | POA: Insufficient documentation

## 2011-12-11 DIAGNOSIS — O9989 Other specified diseases and conditions complicating pregnancy, childbirth and the puerperium: Secondary | ICD-10-CM | POA: Insufficient documentation

## 2011-12-11 DIAGNOSIS — G8929 Other chronic pain: Secondary | ICD-10-CM | POA: Insufficient documentation

## 2011-12-11 DIAGNOSIS — M549 Dorsalgia, unspecified: Secondary | ICD-10-CM | POA: Insufficient documentation

## 2011-12-11 DIAGNOSIS — M545 Low back pain: Secondary | ICD-10-CM

## 2011-12-11 NOTE — ED Notes (Signed)
C/o back pain since moving furniture yesterday, pt is [redacted] weeks pregnant and this is her second pregnancy, last prenatal visit a month ago per pt and everything WNL per pt, states pain radiates down right hip

## 2011-12-11 NOTE — ED Provider Notes (Signed)
History  This chart was scribed for Shelda Jakes, MD by Bennett Scrape and Temilola Ajibulu. This patient was seen in room APA01/APA01 and the patient's care was started at 11:05PM. CSN: [id number]  Arrival date & time 12/11/11  2027   First MD Initiated Contact with Patient 12/11/11 2057      Chief Complaint  Patient presents with  . Back Pain     Patient is a 31 y.o. female presenting with back pain. The history is provided by the patient. No language interpreter was used.  Back Pain  The current episode started 12 to 24 hours ago. The problem occurs constantly. The problem has been gradually worsening. The pain is associated with lifting heavy objects. Quality: sharp. Stiffness is present in the morning. Pertinent negatives include no chest pain, no fever, no headaches, no abdominal pain and no dysuria. The treatment provided no relief. Risk factors include pregnancy.    Rebecca [name] is a 31 y.o. female who is [redacted] weeks pregnant presents to the Emergency Department complaining of sudden onset, gradually worsening, constant sharp lower back pain radiating down to her hips that started yesterday. Pt claims that she was moving furniture in the nursery when she felt a pop in her lower back. She states that she woke up this morning feeling very stiff. Pt reports taking tylenol for the pain with no relief. Pt claims she was dropped from her OB, because she continuously missed appointments and reports her last prenatal visit was a month ago with everything being WNL. Pt states that she was referred to the [facility] who stated she was too far along.  Pt also claims that no other OB will take her on at this time. Pt states that the pain is worse with movement and better with rest/laying doen. Pt denies neck pain, fever, SOB, chest pain and dysuria as associated symptoms. Pt has h/o kidney stone, MRSA, mental disorder and anxiety. Pt is a current everyday smoker and denies any alcohol  use.  Pt's former OBGYN: Dr. Emelda Fear   Past Medical History  Diagnosis Date  . Kidney stone   . Kidney stone   . MRSA (methicillin resistant staph aureus) culture positive   . 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 Anxiety     currently no meds    Past Surgical History  Procedure Date  . Wisdom tooth extraction 1995    Family History  Problem Relation Age of Onset  . Hypertension Mother     History  Substance Use Topics  . Smoking status: Current Everyday Smoker -- 0.5 packs/day for 12 years    Types: Cigarettes  . Smokeless tobacco: Never Used  . Alcohol Use: No    OB History    Grav Para Term Preterm Abortions TAB SAB Ect Mult Living   2 1 1  0 0 0 0 0 0 1      Review of Systems  Constitutional: Negative for fever and chills.  HENT: Negative for sore throat, rhinorrhea and neck pain.   Eyes: Negative for visual disturbance.  Respiratory: Negative for cough and shortness of breath.   Cardiovascular: Negative for chest pain.  Gastrointestinal: Negative for nausea, vomiting, abdominal pain and diarrhea.  Genitourinary: Negative for dysuria, urgency and hematuria.  Musculoskeletal: Positive for back pain.  Skin: Negative for rash.  Neurological: Negative for seizures and headaches.  Psychiatric/Behavioral: Negative for confusion.    Allergies  Citalopram hydrobromide; Propoxacet-n; and Tramadol  Home Medications   Current Outpatient Rx  Name Route Sig Dispense Refill  . ACETAMINOPHEN 500 MG PO TABS Oral Take 1,000 mg by mouth every 6 (six) hours as needed. For pain    . PRENATAL 27-0.8 MG PO TABS Oral Take 1 tablet by mouth daily.        Triage Vitals: BP 129/67  Pulse 76  Temp(Src) 98.2 F (36.8 C) (Oral)  Resp 16  Ht 5\' 8"  (1.727 m)  Wt 198 lb (89.812 kg)  BMI 30.11 kg/m2  SpO2 99%  LMP 03/24/2011  Physical Exam  Nursing note and vitals reviewed. Constitutional: She is oriented to person, place, and time. She appears  well-developed and well-nourished.  HENT:  Head: Normocephalic and atraumatic.  Eyes: Conjunctivae and EOM are normal.  Neck: Normal range of motion. Neck supple.  Cardiovascular: Normal rate, regular rhythm and normal heart sounds.   Pulmonary/Chest: Effort normal and breath sounds normal. No respiratory distress.  Abdominal: Soft. Bowel sounds are normal.  Musculoskeletal: Normal range of motion. She exhibits no edema (no lower leg edema).  Neurological: She is alert and oriented to person, place, and time. No cranial nerve deficit.  Skin: Skin is warm and dry.  Psychiatric: She has a normal mood and affect. Her behavior is normal.    ED Course  Procedures (including critical care time)  DIAGNOSTIC STUDIES: Oxygen Saturation is 100% on room air, normal by my interpretation.    COORDINATION OF CARE: 11:12PM: Advised pt that pain medication is not good for the baby 11:18PM: Discussed measuring fetal heart tones with pt and pt agreed.    Labs Reviewed - No data to display No results found.   1. Back pain   2. BACK PAIN, LUMBAR, CHRONIC   3. Opioid dependence   4. Normal pregnancy, repeat       MDM  Patient is [redacted] weeks pregnant, EDC 01/06/12, G2P1. Hx consistent with lumbar strain, no LE focal deficiets. Past ED visits depicts multiple visits for pain related complaints since 6/12. Patient states currently not followed by Ob , was followed by [facility] until recently but she claims they dropped her for not making appointments. Now can not get a new Ob. She denies any labor pains or pelvic pain vaginal bleeding or discharge. In ED patient in NAD moving fairly well.   FHT screen in ED depicted heart rate of 120's, this seemed low and concerning. Recommended tele fetal monitoring and interpretation by [facility]. Patient was persistent in need for narcotic pain meds. Low fetal heart rate may be related to chronic narcotic use/abuse by mother. Monitoring initially showed variable FH  rate 120 to 140's. Mother stated it is always low.   Patient left AMA stating that if she did not get pain meds now she was leaving. Concern for potential fetal concern based on low heart rate expressed. Patient left AMA    I personally performed the services described in this documentation, which was scribed in my presence. The recorded information has been reviewed and considered.    Shelda Jakes, MD 12/12/11 [phone]

## 2011-12-11 NOTE — ED Notes (Signed)
Fetal heart tones 128 

## 2011-12-11 NOTE — ED Notes (Signed)
C/o lower back pain onset yesterday after moving furniture; states felt "pop".

## 2011-12-12 NOTE — Progress Notes (Signed)
AP RN called and stated that pt was upset that she was not going to receive any pain medication and took all monitors off and left ED

## 2011-12-12 NOTE — ED Notes (Signed)
[facility] wants heart rate 110-160. EDP will be notified.

## 2011-12-12 NOTE — ED Notes (Signed)
[facility] aware that pt left AMA.

## 2011-12-12 NOTE — ED Notes (Signed)
Pt aware of the benefits of staying: improvement in condition and the risk of leaving are worsening of condition and death. Pt upset because she wanted pain meds.

## 2011-12-12 NOTE — Progress Notes (Signed)
Pt into [facility] c/o back pain after moving furniture.  Pt seen for routine care @ [facility].

## 2011-12-12 NOTE — ED Notes (Signed)
EDP advises that pt needs to be monitored by [facility] . [facility] called, pt on monitor, nurse from [facility] will call back after monitoring pt.

## 2011-12-13 ENCOUNTER — Telehealth (HOSPITAL_COMMUNITY): Payer: Self-pay | Admitting: *Deleted

## 2011-12-13 NOTE — Telephone Encounter (Signed)
Preadmission screen  

## 2011-12-14 ENCOUNTER — Encounter (HOSPITAL_COMMUNITY): Payer: Self-pay | Admitting: *Deleted

## 2012-04-22 ENCOUNTER — Emergency Department (HOSPITAL_COMMUNITY)
Admission: EM | Admit: 2012-04-22 | Discharge: 2012-04-22 | Disposition: A | Discharge status: Home / Self Care | Payer: Self-pay | Attending: Emergency Medicine | Admitting: Emergency Medicine

## 2012-04-22 ENCOUNTER — Encounter (HOSPITAL_COMMUNITY): Payer: Self-pay

## 2012-04-22 DIAGNOSIS — F17200 Nicotine dependence, unspecified, uncomplicated: Secondary | ICD-10-CM | POA: Insufficient documentation

## 2012-04-22 DIAGNOSIS — M549 Dorsalgia, unspecified: Secondary | ICD-10-CM | POA: Insufficient documentation

## 2012-04-22 DIAGNOSIS — Z8614 Personal history of Methicillin resistant Staphylococcus aureus infection: Secondary | ICD-10-CM | POA: Insufficient documentation

## 2012-04-22 LAB — URINALYSIS, ROUTINE W REFLEX MICROSCOPIC
Bilirubin Urine: NEGATIVE
Glucose, UA: NEGATIVE mg/dL
Hgb urine dipstick: NEGATIVE
Ketones, ur: NEGATIVE mg/dL
Leukocytes, UA: NEGATIVE
Nitrite: NEGATIVE
Protein, ur: NEGATIVE mg/dL
Specific Gravity, Urine: 1.015 (ref 1.005–1.030)
Urobilinogen, UA: 0.2 mg/dL (ref 0.0–1.0)
pH: 7 (ref 5.0–8.0)

## 2012-04-22 MED ORDER — DIAZEPAM 5 MG PO TABS
5.0000 mg | ORAL_TABLET | Freq: Once | ORAL | Status: AC
  Administered 2012-04-22: 5 mg via ORAL
  Filled 2012-04-22: qty 1

## 2012-04-22 MED ORDER — IBUPROFEN 400 MG PO TABS
400.0000 mg | ORAL_TABLET | Freq: Once | ORAL | Status: AC
  Administered 2012-04-22: 400 mg via ORAL
  Filled 2012-04-22: qty 1

## 2012-04-22 MED ORDER — HYDROMORPHONE HCL PF 1 MG/ML IJ SOLN
1.0000 mg | Freq: Once | INTRAMUSCULAR | Status: AC
  Administered 2012-04-22: 1 mg via INTRAMUSCULAR
  Filled 2012-04-22: qty 1

## 2012-04-22 MED ORDER — NAPROXEN 500 MG PO TABS: 500.0000 mg | ORAL_TABLET | Freq: Two times a day (BID) | ORAL | Status: DC | PRN

## 2012-04-22 NOTE — ED Provider Notes (Signed)
History   This chart was scribed for Rebecca Razor, MD by Charolett Bumpers . The patient was seen in room APA03/APA03. Patient's care was started at 0701.    CSN: [id number]  Arrival date & time 04/22/12  0555   First MD Initiated Contact with Patient 04/22/12 0701      Chief Complaint  Patient presents with  . Back Pain    (Consider location/radiation/quality/duration/timing/severity/associated sxs/prior treatment) HPI [name] is a 31 y.o. female who presents to the Emergency Department complaining of constant, severe lower back pain for past 3 days. Pt reports associated fevers, chills, nausea, diaphoresis and hematuria. Pt also report pressure with urination but denies any other urinary symptoms. Pt reports that the pain radiates to her left hip but denies any other radiation of pain. Pt denies any modifying factors. Pt reports that she took Advil with no relief. Pt reports a h/o kidney stones and reports her symptoms today are similar. Pt denies any recent falls or injuries. Pt denies any numbness/tingling of her extremities. Pt reports no pertinent medical hx or surgeries on her back.   Past Medical History  Diagnosis Date  . Kidney stone   . Kidney stone   . MRSA (methicillin resistant staph aureus) culture positive   . 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 Anxiety     currently no meds  . Pregnancy induced hypertension     first preg  . Gestational diabetes     first preg  . Drug abuse, opioid type     released from [facility] for falsifying prescriptions    Past Surgical History  Procedure Date  . Wisdom tooth extraction 1995    Family History  Problem Relation Age of Onset  . Hypertension Mother     History  Substance Use Topics  . Smoking status: Current Everyday Smoker -- 0.5 packs/day for 12 years    Types: Cigarettes  . Smokeless tobacco: Never Used  . Alcohol Use: No    OB History    Grav Para Term Preterm  Abortions TAB SAB Ect Mult Living   2 1 1  0 0 0 0 0 0 1      Review of Systems  Constitutional: Positive for fever, chills and diaphoresis.  Gastrointestinal: Positive for nausea.  Genitourinary: Positive for hematuria. Negative for dysuria.  Musculoskeletal: Positive for back pain.  Neurological: Negative for numbness.  All other systems reviewed and are negative.    Allergies  Citalopram hydrobromide; Propoxyphene-acetaminophen; and Tramadol  Home Medications   Current Outpatient Rx  Name Route Sig Dispense Refill  . ACETAMINOPHEN 500 MG PO TABS Oral Take 1,000 mg by mouth every 6 (six) hours as needed. For pain    . PRENATAL 27-0.8 MG PO TABS Oral Take 1 tablet by mouth daily.        BP 149/88  Pulse 80  Temp 98.2 F (36.8 C) (Oral)  Resp 16  Ht 5\' 8"  (1.727 m)  Wt 183 lb (83.008 kg)  BMI 27.82 kg/m2  SpO2 100%  LMP 04/10/2011  Breastfeeding? Unknown  Physical Exam  Nursing note and vitals reviewed. Constitutional: She is oriented to person, place, and time. She appears well-developed and well-nourished. No distress.  HENT:  Head: Normocephalic and atraumatic.  Eyes: Conjunctivae are normal. Right eye exhibits no discharge. Left eye exhibits no discharge.  Neck: Normal range of motion. Neck supple.  Cardiovascular: Normal rate, regular rhythm and normal heart sounds.  Exam reveals no gallop and no friction rub.   No murmur heard. Pulmonary/Chest: Effort normal and breath sounds normal. No respiratory distress.  Abdominal: Soft. She exhibits no distension. There is no tenderness.       No CVA tenderness.   Musculoskeletal: She exhibits tenderness. She exhibits no edema.       Tenderness to left lumbar perispinal with no overlying skin changes.   Neurological: She is alert and oriented to person, place, and time.       Strength 5/5 in lower extremities with normal flexion. Sensation intact to light touch.   Skin: Skin is warm and dry.  Psychiatric: She has a  normal mood and affect. Her behavior is normal. Thought content normal.    ED Course  Procedures (including critical care time)  DIAGNOSTIC STUDIES: Oxygen Saturation is 100% on room air, normal by my interpretation.    COORDINATION OF CARE:  07:11-Discussed planned course of treatment with the patient, who is agreeable at this time.      Labs Reviewed  URINALYSIS, ROUTINE W REFLEX MICROSCOPIC   No results found.   1. Back pain       MDM  31yF with atraumatic back pain. Nonfocal neuro exam. Doubt kidney stone given description of pain, location and no blood in urine. Pt with hx of opioid abuse and falsifying prescriptions. Did receive a dose of dilaudid in ED. Script for naproxen. Return precautions discussed.  I personally preformed the services scribed in my presence. The recorded information has been reviewed and considered. Rebecca Razor, MD.       Rebecca Razor, MD 04/22/12 [phone]

## 2012-04-22 NOTE — ED Notes (Signed)
Lower back pain and pain down left leg, states she feels like she is having chills, denies fevers at home.

## 2012-05-05 IMAGING — CR DG KNEE COMPLETE 4+V*L*
4 series · 4 of 4 positions shown · non-contrast
Comparison: None.

CLINICAL DATA: Trauma, pain

LEFT KNEE - COMPLETE 4+ VIEW

[t knee ap left]
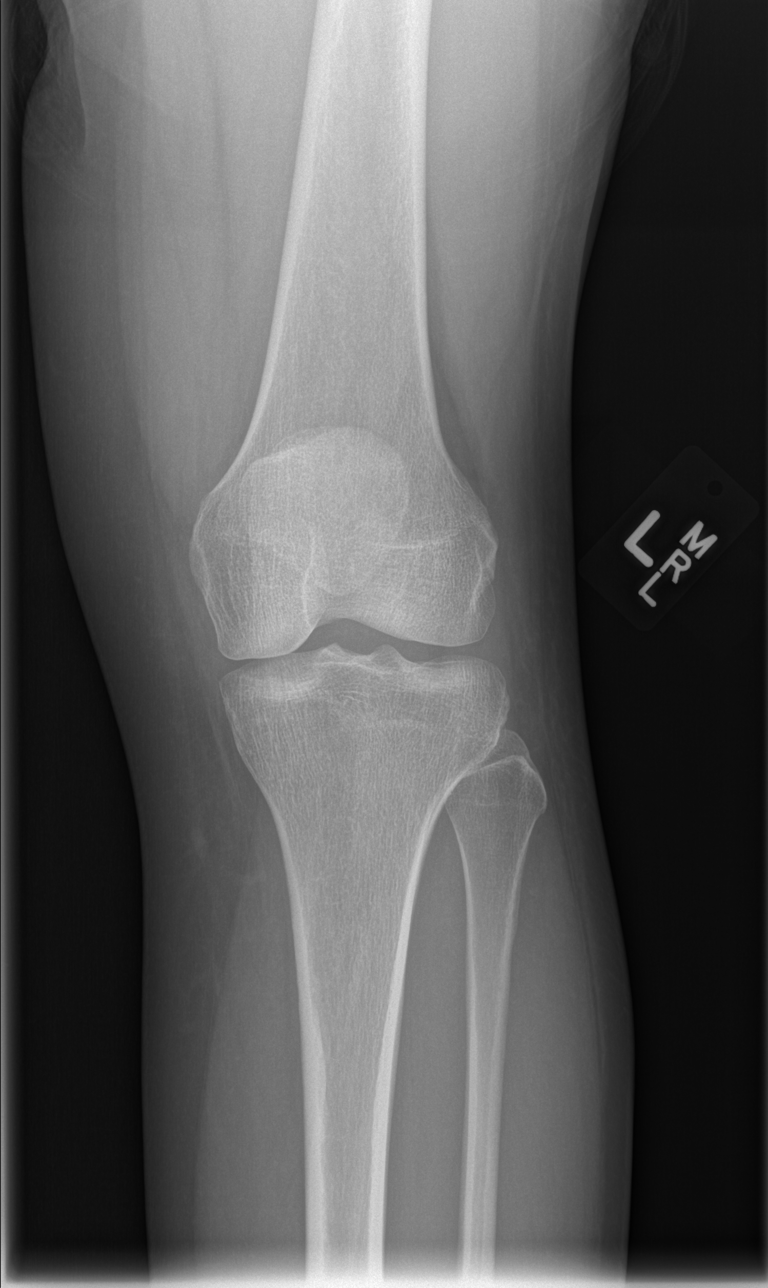

[t knee obl left (1 of 2)]
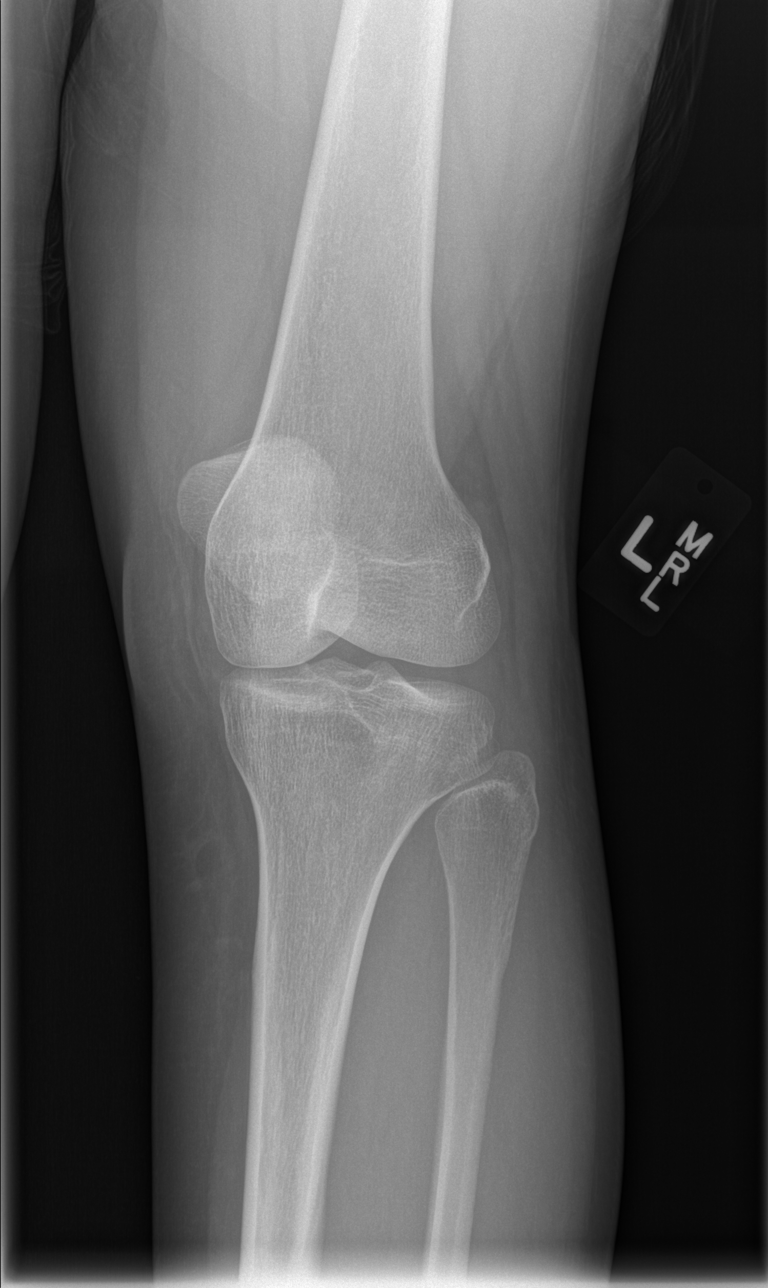

[t knee obl left (2 of 2)]
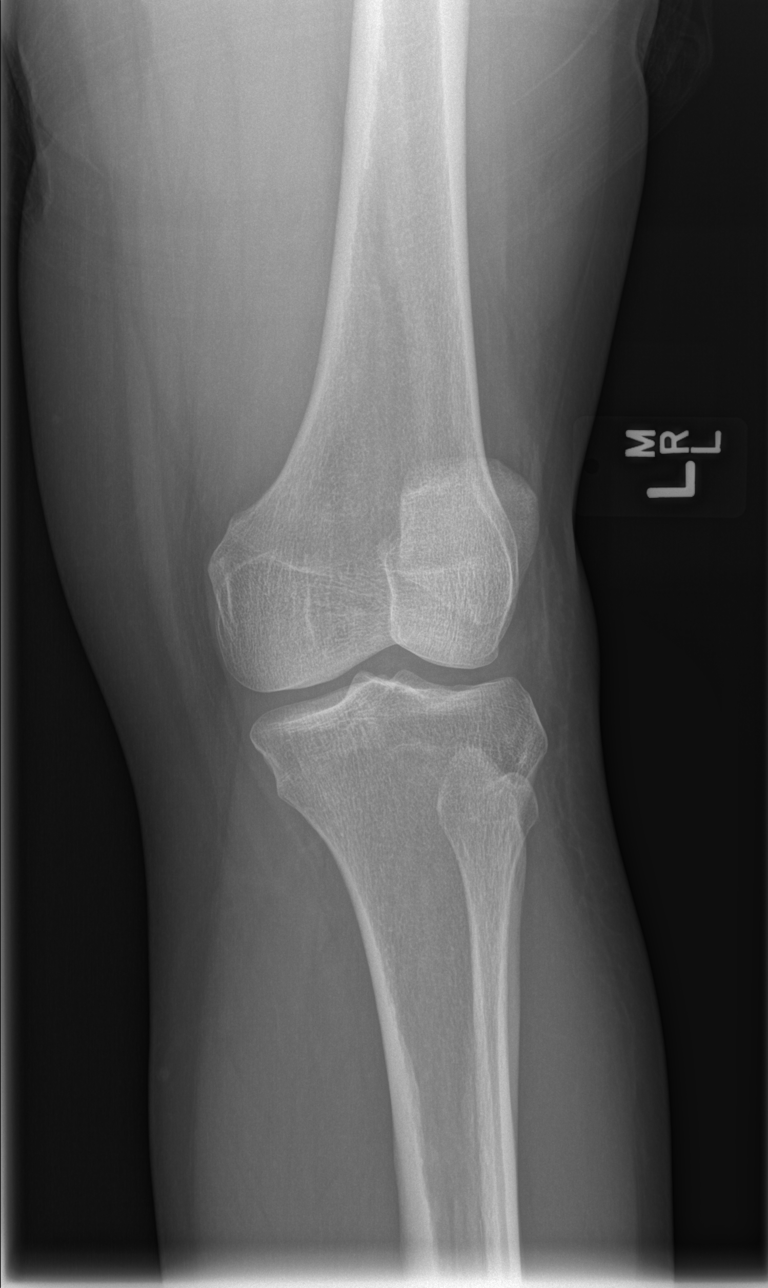

[t knee lat left]
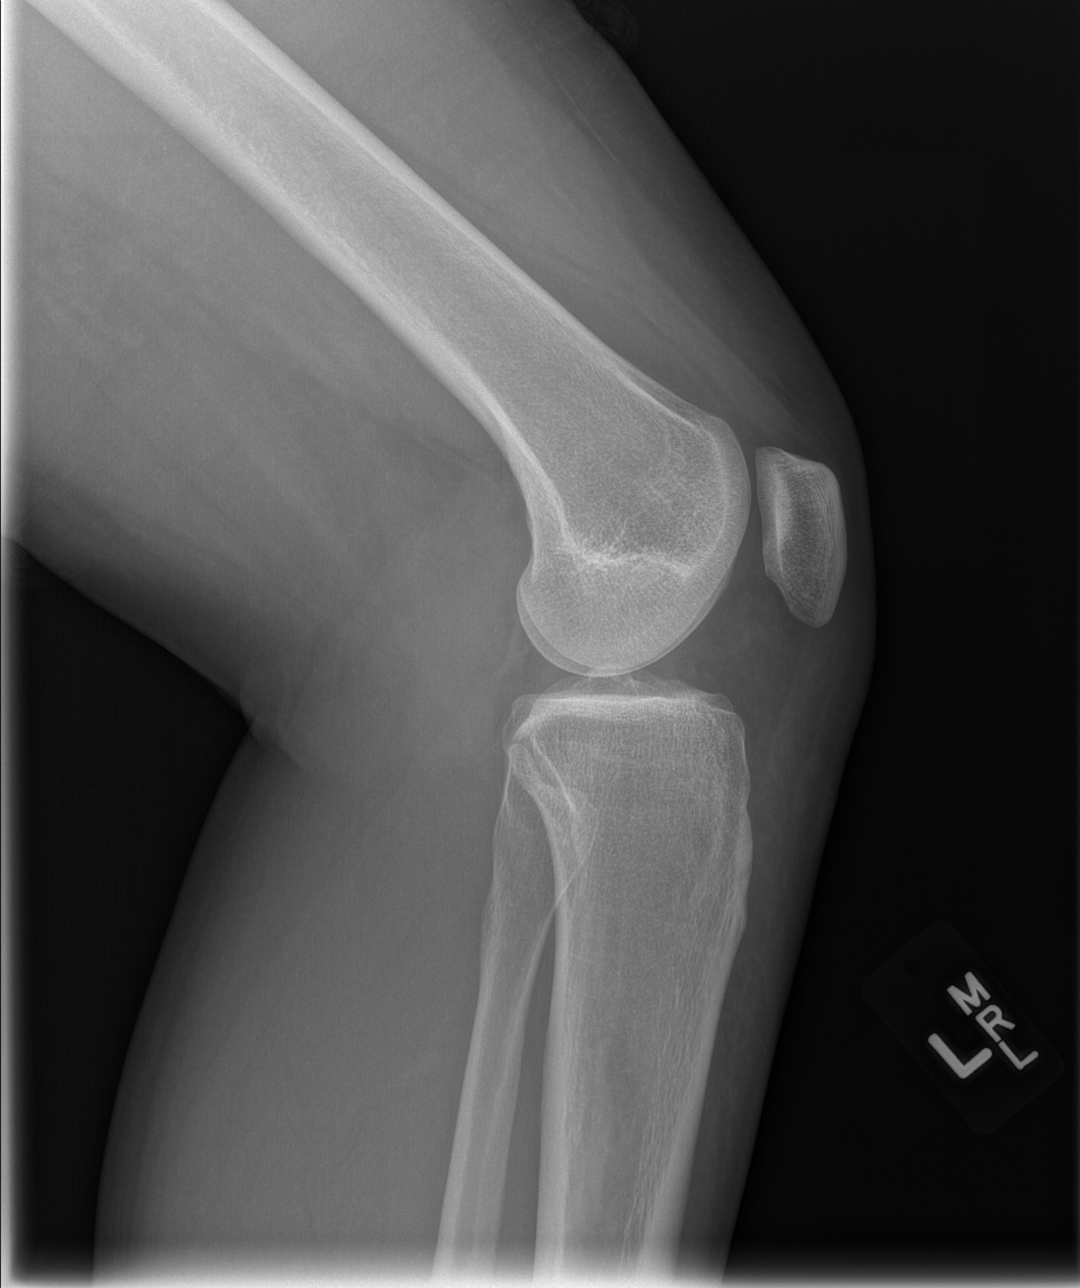

[4 of 4 positions shown; findings below may reference images not displayed]

FINDINGS: Four views of the left knee submitted.

No acute fracture or subluxation.  No radiopaque foreign body.  No
joint effusion.
IMPRESSION: No acute fracture or subluxation.

## 2012-05-18 IMAGING — US US OB TRANSVAGINAL
1 series · 14 of 27 positions shown · non-contrast
Comparison: 05/09/2011

CLINICAL DATA: 30-year-old pregnant female with pelvic pain and
spotting.  Recent ultrasound demonstrating gestational sac without
yolk sac or embryo.

TRANSVAGINAL OBSTETRIC US
TECHNIQUE: Transvaginal ultrasound was performed for complete
evaluation of the gestation as well as the maternal uterus, adnexal
regions, and pelvic cul-de-sac.

[Series 1: us ob transvaginal · 27 acquisitions, 14 frames shown]
[im 1/27]
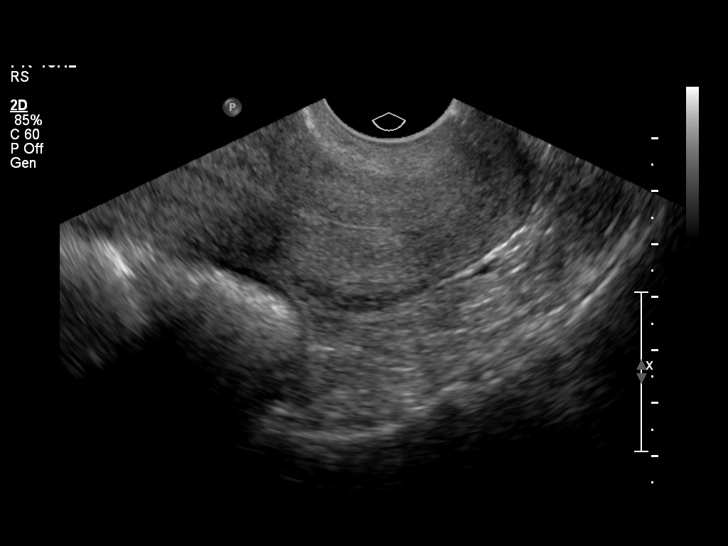
[im 3/27]
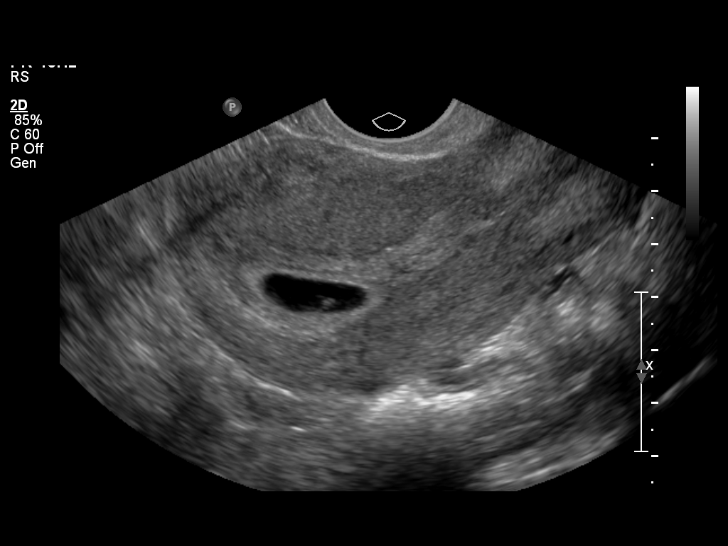
[im 5/27]
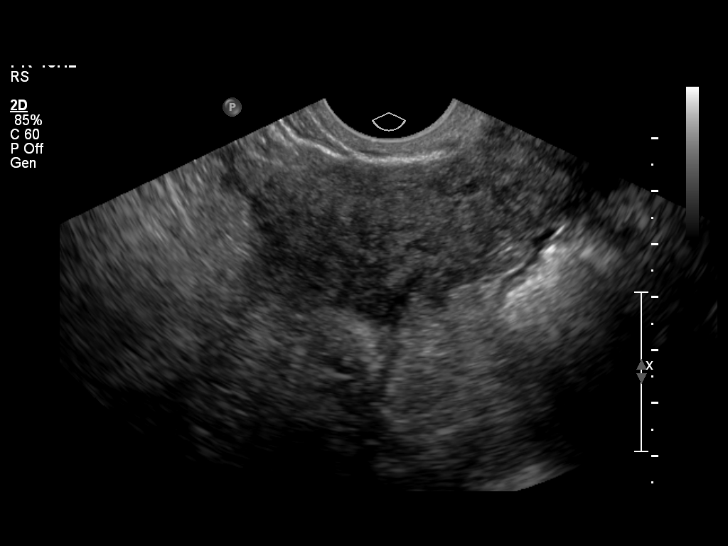
[im 7/27]
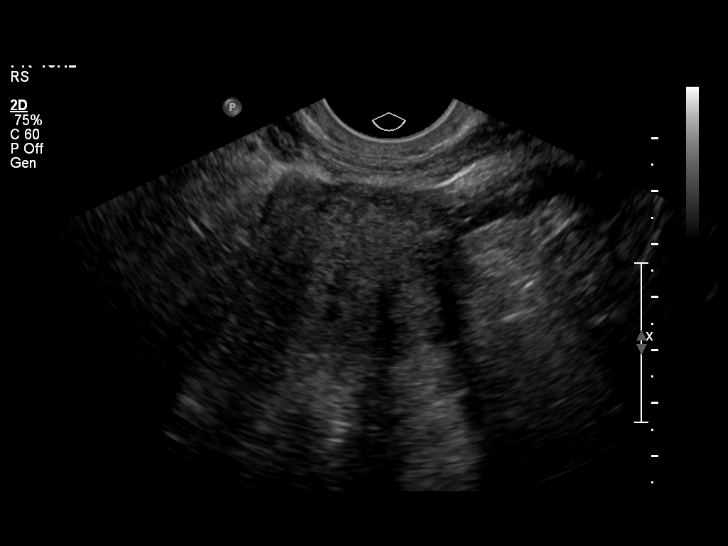
[im 9/27]
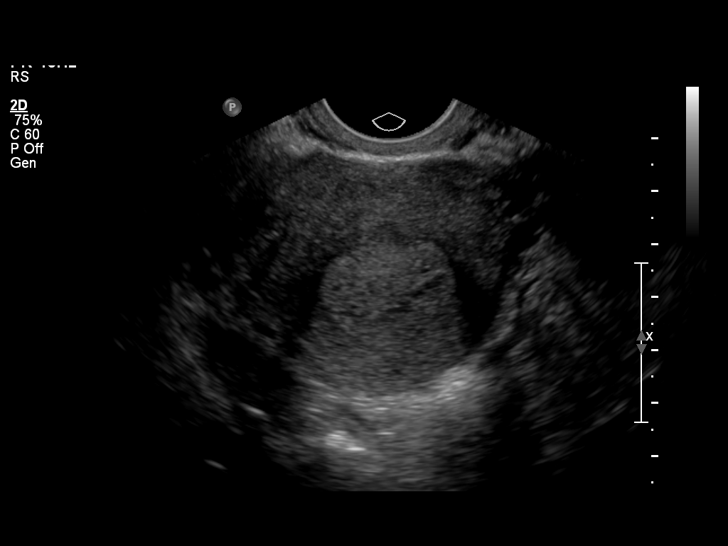
[im 11/27]
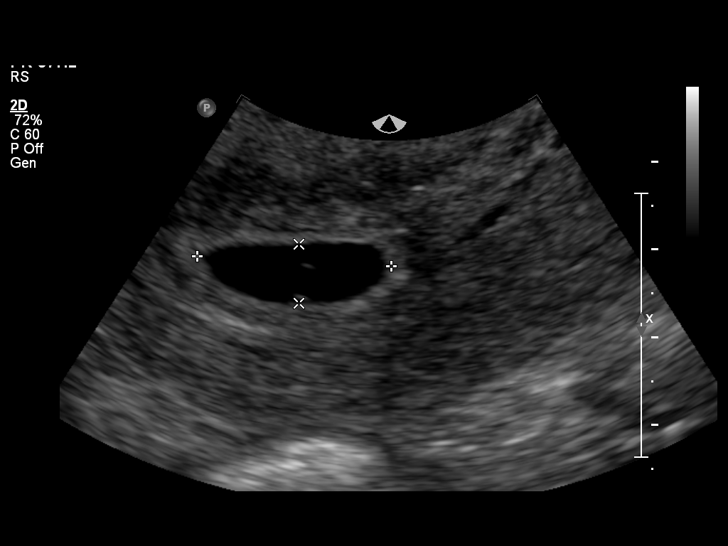
[im 13/27]
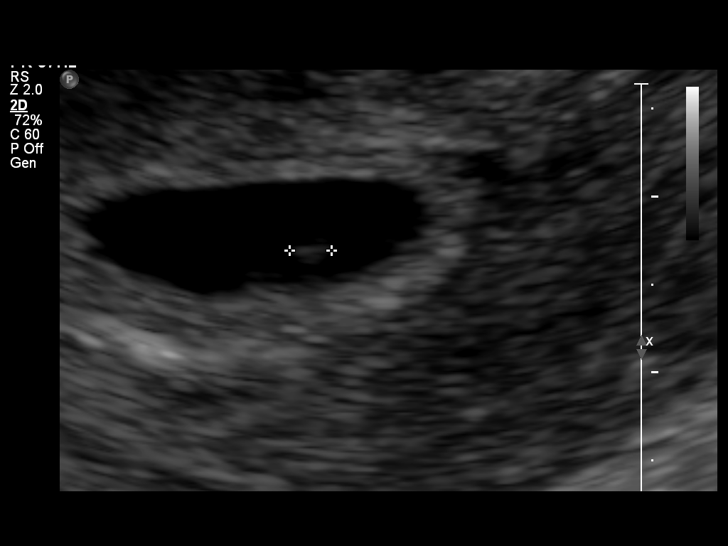
[im 15/27]
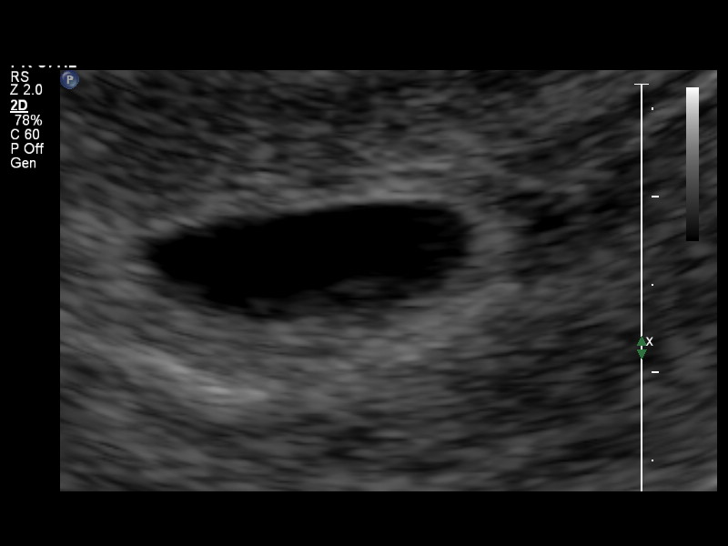
[im 17/27]
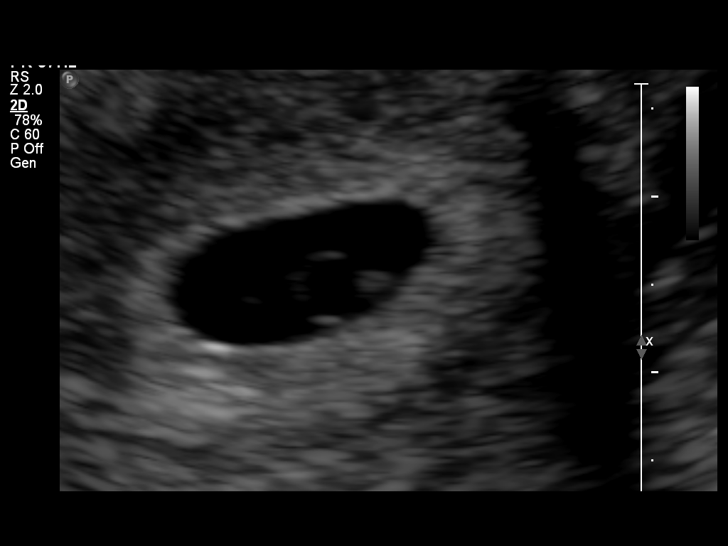
[im 19/27]
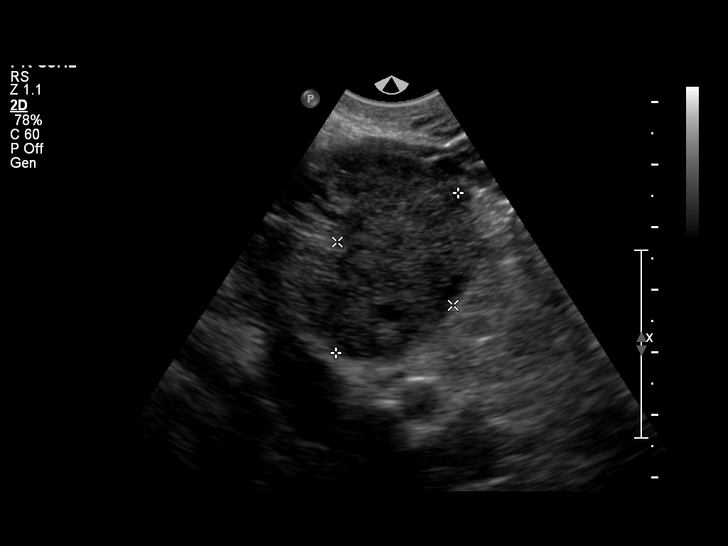
[im 21/27]
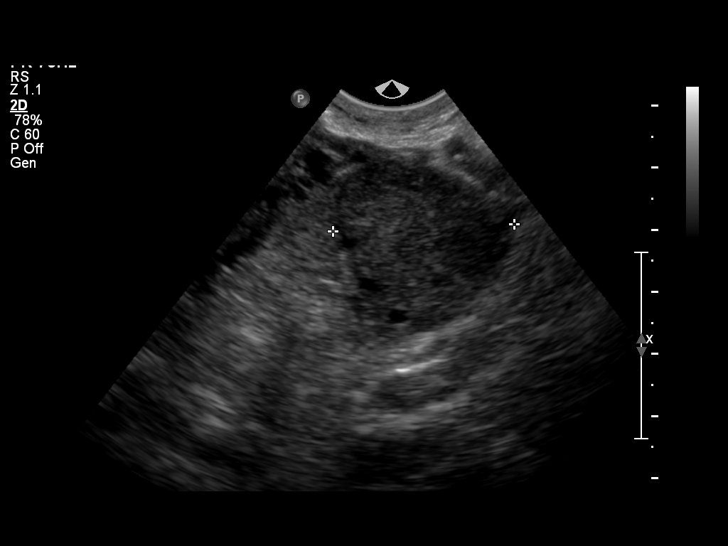
[im 23/27]
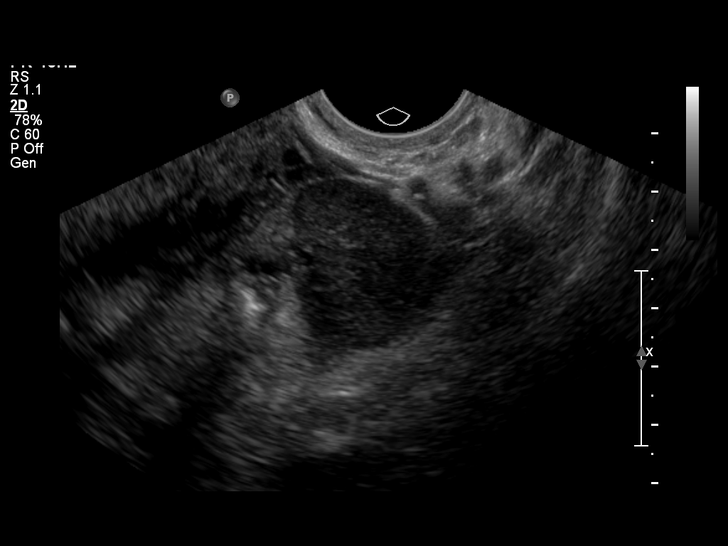
[im 25/27]
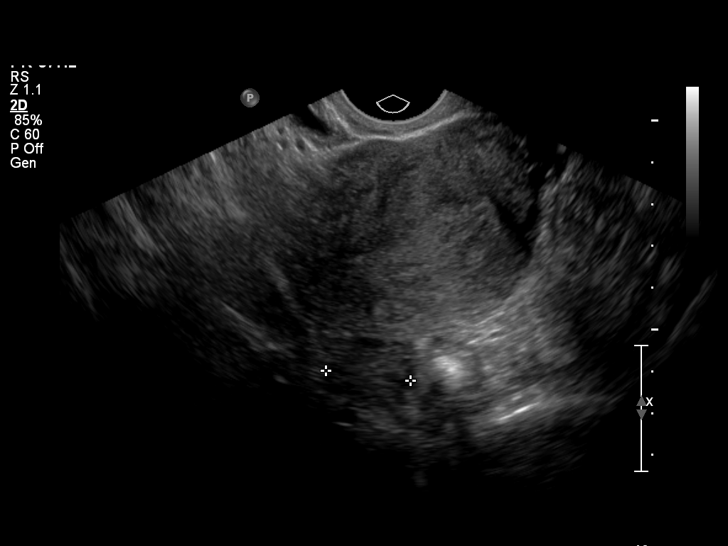
[im 27/27]
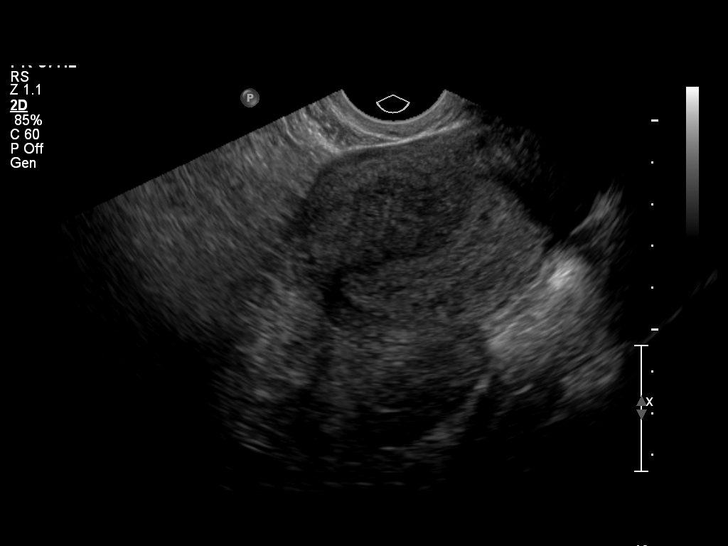

[14 of 27 positions shown; findings below may reference images not displayed]

Intrauterine gestational sac: Single and normal in appearance.
Yolk sac: Present
Embryo: Present
Cardiac Activity: Present
Heart Rate: 98 beats per minute

CRL: 2.4 mm           5   w  5   d        US EDC: 01/12/2012

Subchorionic hemorrhage: None

Maternal uterus/adnexae:
The ovaries bilaterally are unremarkable.
There is no evidence of free fluid or adnexal mass.
IMPRESSION: Single living intrauterine gestation with estimated gestational age
of 5 weeks 5 days by this ultrasound.  Please note fetal heart rate
of 98 beats per minute.

No evidence of subchorionic hemorrhage.

## 2012-05-25 ENCOUNTER — Ambulatory Visit: Payer: Medicaid Other | Admitting: Urology

## 2012-06-21 IMAGING — US US OB COMP LESS 14 WK
1 series · 14 of 28 positions shown · non-contrast
Comparison: None.

CLINICAL DATA: Abnormal bleeding; EGA by LMP is 12 weeks 4 days

OBSTETRIC <14 WK US AND TRANSVAGINAL OB US
TECHNIQUE: Both transabdominal and transvaginal ultrasound
examinations were performed for complete evaluation of the
gestation as well as the maternal uterus, adnexal regions, and
pelvic cul-de-sac.  Transvaginal technique was performed to assess
early pregnancy.

[Series 1: us ob comp less 14 wk · 0.31mm/px · 14 of 68 slices shown]
[im 3/68]
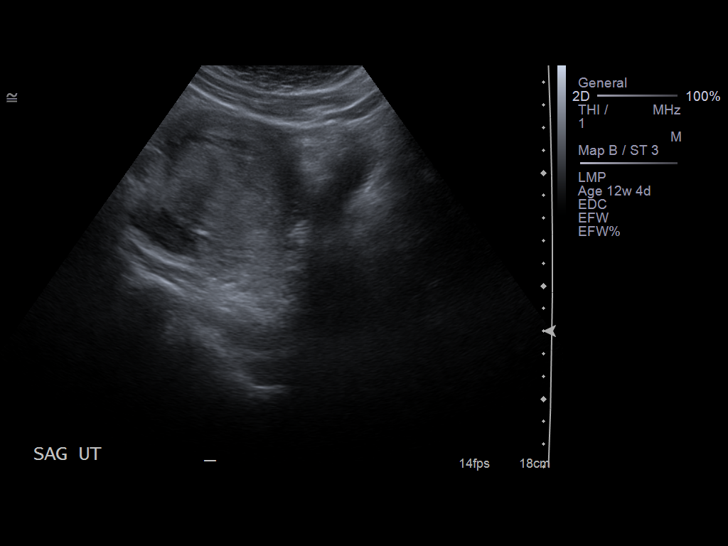
[im 8/68]
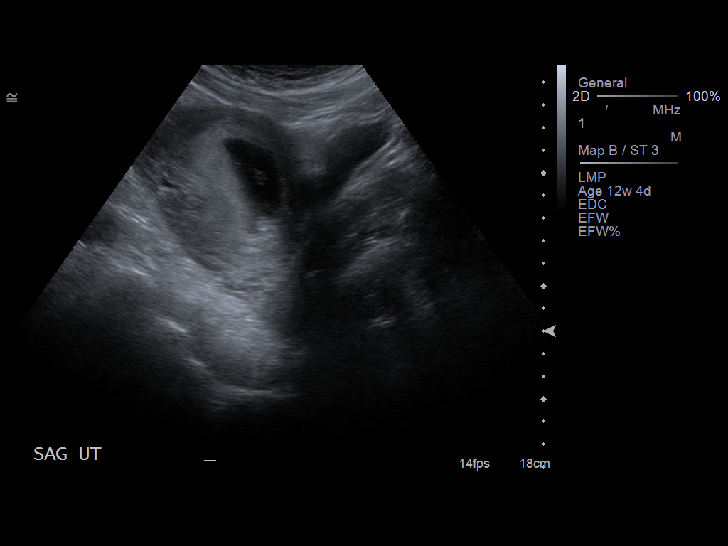
[im 13/68]
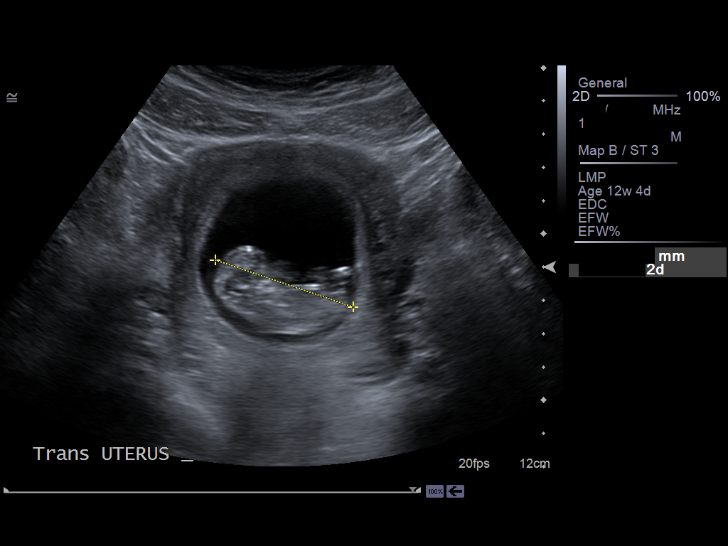
[im 18/68]
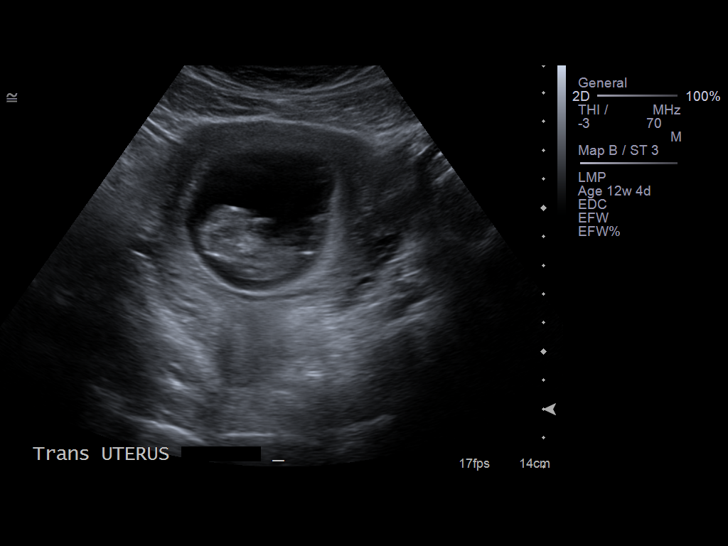
[im 23/68]
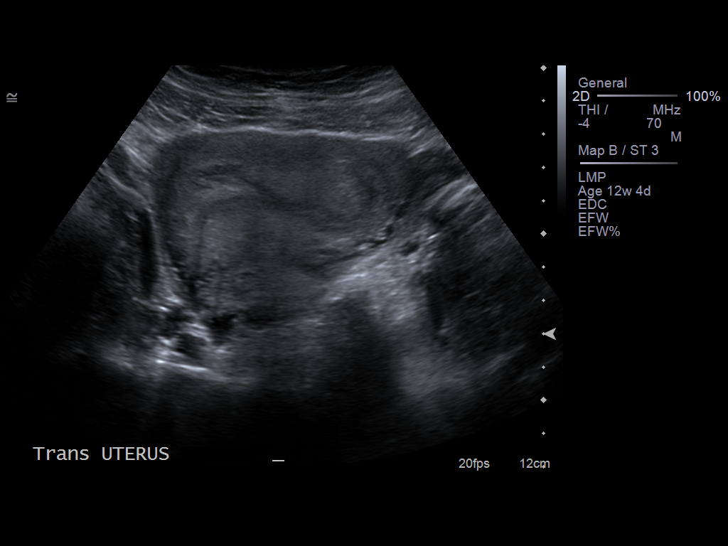
[im 28/68]
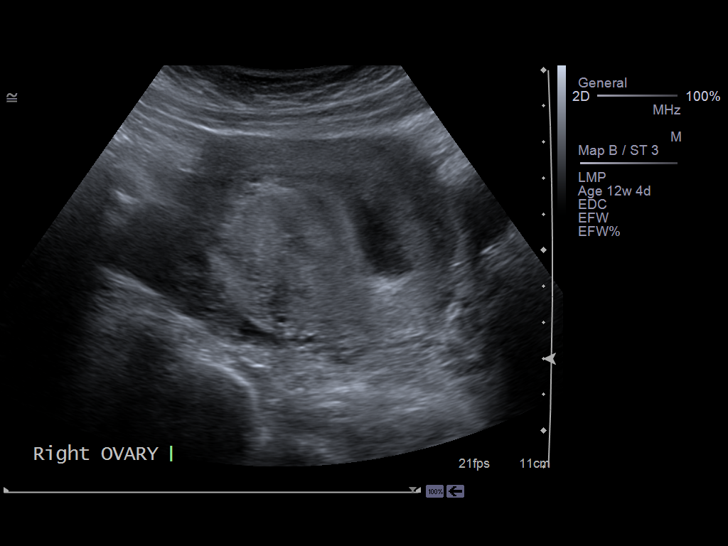
[im 33/68]
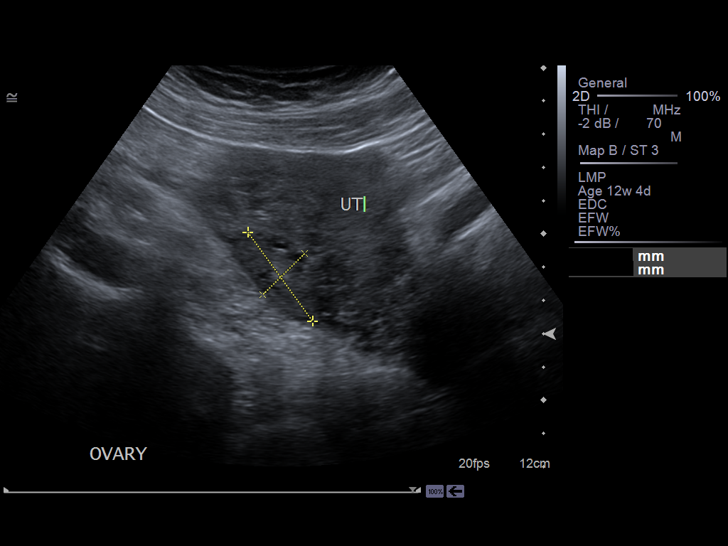
[im 38/68]
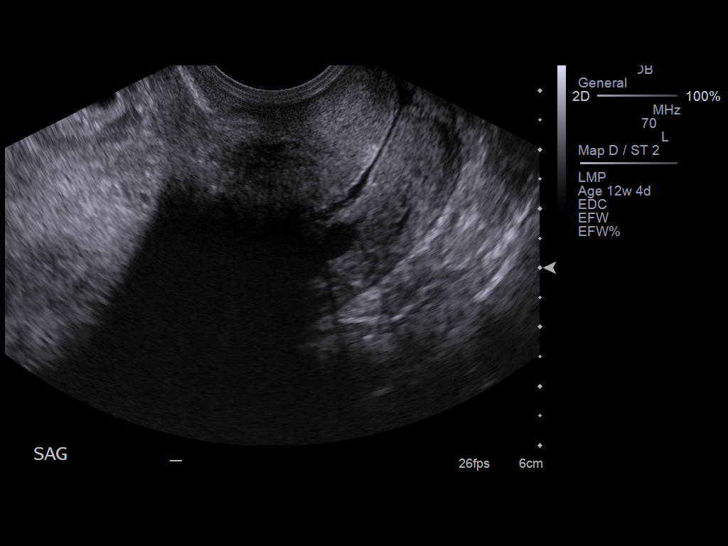
[im 43/68]
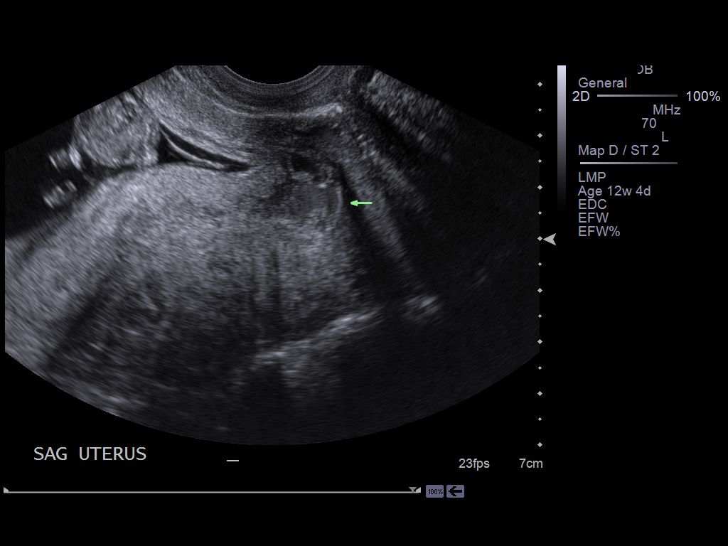
[im 48/68]
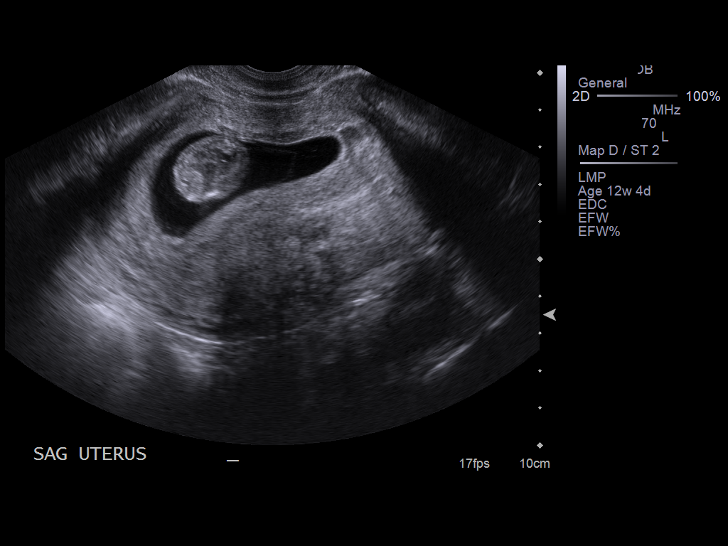
[im 53/68]
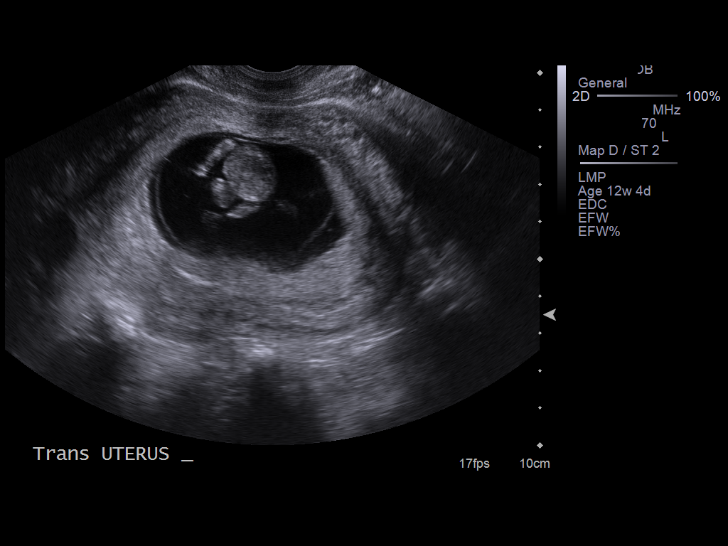
[im 58/68]
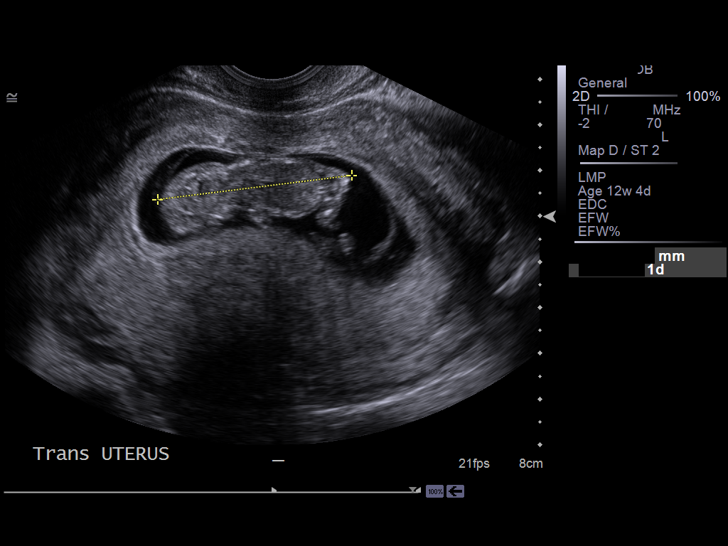
[im 63/68]
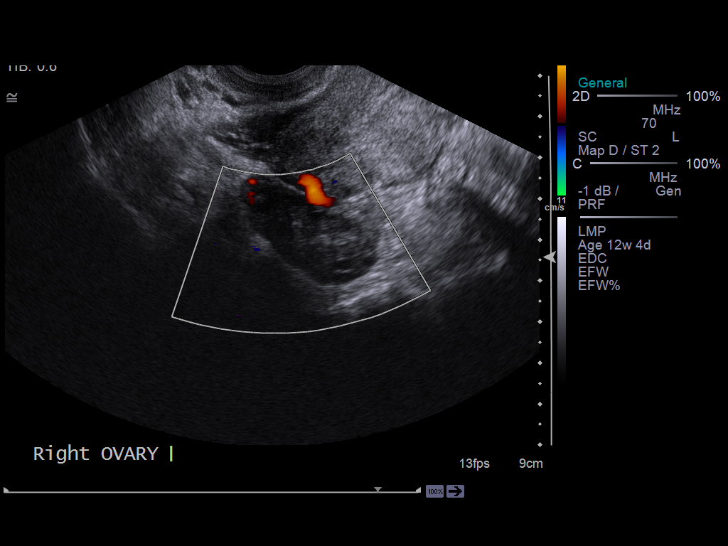
[im 68/68]
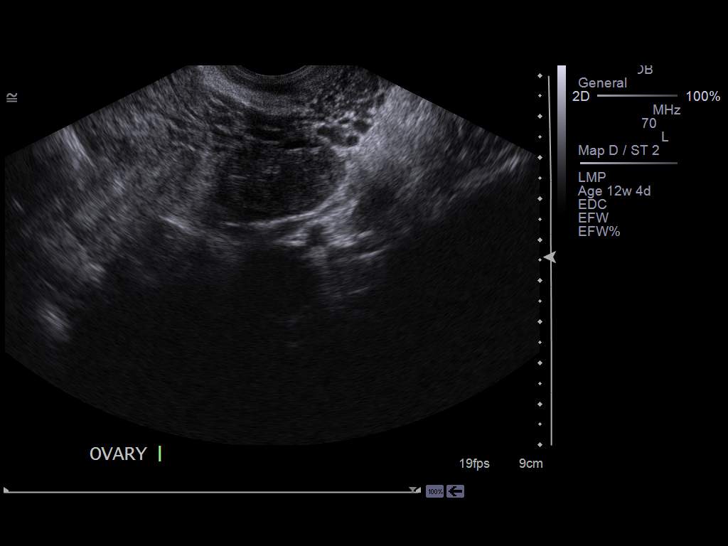

[14 of 28 positions shown; findings below may reference images not displayed]

Intrauterine gestational sac:  Visualized/normal in shape.
Yolk sac: Yes
Embryo: Yes
Cardiac Activity: Yes
Heart Rate: 153 bpm

CRL: 45 mm    11   w  2   d         US EDC: January 07, 2012

Maternal uterus/adnexae:
There is a small subacute subchorionic hemorrhage.  Both ovaries
have a normal appearance.
IMPRESSION: There is a single living intrauterine gestation with crown rump
length indicating a gestational age of 11 weeks 2 days which is 1
week 2 days younger than estimated by LMP.

Full anatomy scan at 19 - 21 weeks is recommended.

## 2012-08-29 NOTE — L&D Delivery Note (Signed)
Delivery Note At 1:30 AM a viable female was delivered via Vaginal, Spontaneous Delivery (Presentation: ROA ), delivered through loose nuchal cord x 1. The shoulders were not forthcoming, pt was placed in mcrobert's position and then suprapubic pressure by RN in combination w/ maternal pushing efforts resulted in birth of anterior shoulder and then remainder of body.  APGAR: pending at time of note, baby w/ spontaneous cry and respiration ; weight: not available at time of note.   No vaginal bleeding prior to birth of placenta. After birth of placenta, large gush of blood w/ boggy uterus that firmed w/ massage, and cessation of bleeding. Approximately 1L of blood in placenta basin.  Placenta status: Intact, Spontaneous, w/ foul odor- will send to path.  Cord: 3vc with the following complications: none .  Cord pH: not done  Anesthesia: Epidural  Episiotomy: None Lacerations: None Suture Repair: n/a Est. Blood Loss (mL): 1L  Mom to AICU.  Baby to nursery-stable.  Bottlefeeding, desires IP BTL- but states she doesn't have any insurance, and doesn't have money to pay for it, desires IP depo, then interim nexplanon if possible Magnesium x 24hr pp, states she was placed on norvasc for [facility] prior to pregnancy- will resume norvasc 5mg  daily  Marge Duncans 03/03/2013, 2:05 AM

## 2012-09-27 ENCOUNTER — Inpatient Hospital Stay (HOSPITAL_COMMUNITY): Payer: Self-pay

## 2012-09-27 ENCOUNTER — Inpatient Hospital Stay (HOSPITAL_COMMUNITY)
Admission: AD | Admit: 2012-09-27 | Discharge: 2012-09-27 | Disposition: A | Discharge status: Home / Self Care | Payer: Self-pay | Source: Ambulatory Visit | Attending: Obstetrics & Gynecology | Admitting: Obstetrics & Gynecology

## 2012-09-27 ENCOUNTER — Encounter (HOSPITAL_COMMUNITY): Payer: Self-pay

## 2012-09-27 DIAGNOSIS — B3731 Acute candidiasis of vulva and vagina: Secondary | ICD-10-CM | POA: Insufficient documentation

## 2012-09-27 DIAGNOSIS — B373 Candidiasis of vulva and vagina: Secondary | ICD-10-CM | POA: Insufficient documentation

## 2012-09-27 DIAGNOSIS — O0930 Supervision of pregnancy with insufficient antenatal care, unspecified trimester: Secondary | ICD-10-CM | POA: Insufficient documentation

## 2012-09-27 DIAGNOSIS — O10019 Pre-existing essential hypertension complicating pregnancy, unspecified trimester: Secondary | ICD-10-CM | POA: Insufficient documentation

## 2012-09-27 DIAGNOSIS — O2390 Unspecified genitourinary tract infection in pregnancy, unspecified trimester: Secondary | ICD-10-CM | POA: Insufficient documentation

## 2012-09-27 DIAGNOSIS — I10 Essential (primary) hypertension: Secondary | ICD-10-CM

## 2012-09-27 LAB — OB RESULTS CONSOLE GC/CHLAMYDIA
Chlamydia: NEGATIVE
Gonorrhea: NEGATIVE

## 2012-09-27 LAB — URINALYSIS, ROUTINE W REFLEX MICROSCOPIC
Specific Gravity, Urine: 1.025 (ref 1.005–1.030)
pH: 6.5 (ref 5.0–8.0)

## 2012-09-27 LAB — CBC
Platelets: 222 10*3/uL (ref 150–400)
RBC: 3.99 MIL/uL (ref 3.87–5.11)
RDW: 15.3 % (ref 11.5–15.5)
WBC: 17.5 10*3/uL — ABNORMAL HIGH (ref 4.0–10.5)

## 2012-09-27 LAB — WET PREP, GENITAL
Clue Cells Wet Prep HPF POC: NONE SEEN
Trich, Wet Prep: NONE SEEN

## 2012-09-27 MED ORDER — FLUCONAZOLE 150 MG PO TABS: 150.0000 mg | ORAL_TABLET | Freq: Every day | ORAL | Status: DC

## 2012-09-27 MED ORDER — PRENATAL 27-0.8 MG PO TABS: 1.0000 | ORAL_TABLET | Freq: Every day | ORAL | Status: DC

## 2012-09-27 MED ORDER — LABETALOL HCL 100 MG PO TABS: 200.0000 mg | ORAL_TABLET | Freq: Two times a day (BID) | ORAL | Status: DC

## 2012-09-27 NOTE — MAU Note (Addendum)
Pt states went to [facility] ER in [place], [place] listened to [facility], told pt heart tones were weak, unable to perform ultrasounds at that ER. Also was told her WBC's were elevated, unsure why, given rocephin injection.

## 2012-09-27 NOTE — MAU Provider Note (Signed)
History     CSN: [id number]  Arrival date and time: 09/27/12 1553   First Provider Initiated Contact with Patient 09/27/12 1647      Chief Complaint  Patient presents with  . Bacterial infection, ?weak fetal heartrate    HPI Rebecca [name] is a 32 y.o. G3P1001 at [redacted]w[redacted]d who presents to MAU today for evaluation of multiple complaints. The patient has not had prenatal care. She has been in jail for the last 17 days. She was released last night after they had taken her to an urgent care for N/V x 3 days and LBP and lower abdominal cramping. She was told at that visit that the baby "had a weak heart rate," that her BP was elevated and that she had an infection, but they didn't know where it was coming from. Today, she does not have any pain, she is slightly nauseous, but not have any vomiting. She states that she is having a thick, white discharge that was evaluated with a pelvic exam yesterday, but they "didn't have a lab" so she wasn't told what was causing this. She denies fever, vaginal bleeding, abdominal pain or contractions. She denies headache, vision changes, RUQ pain or peripheral edema. She is a chronic hypertensive on no medications and has a history of pre-eclampsia.   OB History    Grav Para Term Preterm Abortions TAB SAB Ect Mult Living   3 1 1  0 0 0 0 0 0 1      Past Medical History  Diagnosis Date  . Kidney stone   . Kidney stone   . MRSA (methicillin resistant staph aureus) culture positive   . 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 Anxiety     currently no meds  . Pregnancy induced hypertension     first preg  . Gestational diabetes     first preg  . Drug abuse, opioid type     released from [facility] for falsifying prescriptions    Past Surgical History  Procedure Date  . Wisdom tooth extraction 1995    Family History  Problem Relation Age of Onset  . Hypertension Mother     History  Substance Use Topics  . Smoking status:  Current Every Day Smoker -- 0.5 packs/day for 12 years    Types: Cigarettes  . Smokeless tobacco: Never Used  . Alcohol Use: No    Allergies:  Allergies  Allergen Reactions  . Citalopram Hydrobromide Other (See Comments)    dizzINESS, causes panic attacks  . Propoxyphene-Acetaminophen Other (See Comments)    anxiety attacks  . Tramadol Palpitations    Prescriptions prior to admission  Medication Sig Dispense Refill  . acetaminophen (TYLENOL) 500 MG tablet Take 1,000 mg by mouth every 6 (six) hours as needed. For pain in back      . Prenatal Vit-Fe Fumarate-FA (MULTIVITAMIN-PRENATAL) 27-0.8 MG TABS Take 1 tablet by mouth daily.        . [DISCONTINUED] naproxen (NAPROSYN) 500 MG tablet Take 1 tablet (500 mg total) by mouth 2 (two) times daily as needed.  20 tablet  0    ROS All negative unless otherwise noted in HPI Physical Exam   Blood pressure 154/78, pulse 94, temperature 98.2 F (36.8 C), temperature source Oral, resp. rate 16, height 5\' 8"  (1.727 m), weight 183 lb 8 oz (83.235 kg), last menstrual period 04/22/2012, not currently breastfeeding.  Physical Exam  Constitutional: She is oriented to person, place,  and time. She appears well-developed and well-nourished. No distress.  HENT:  Head: Normocephalic.  Cardiovascular: Normal rate, regular rhythm and normal heart sounds.   Respiratory: Effort normal and breath sounds normal. No respiratory distress.  GI: Soft. Bowel sounds are normal. She exhibits no distension and no mass. There is no tenderness. There is no rebound and no guarding.  Genitourinary: Vagina normal. Uterus is enlarged (appropriate for [redacted] weeks GA). Uterus is not tender. Cervix exhibits discharge (thick, white and mucus discharge noted on cervix and in vagina). Cervix exhibits no motion tenderness and no friability. Right adnexum displays no mass and no tenderness. Left adnexum displays no mass and no tenderness.  Neurological: She is alert and oriented to  person, place, and time.  Skin: Skin is warm and dry. No erythema.  Psychiatric: She has a normal mood and affect.   Results for orders placed during the hospital encounter of 09/27/12 (from the past 24 hour(s))  URINALYSIS, ROUTINE W REFLEX MICROSCOPIC     Status: Normal   Collection Time   09/27/12  4:23 PM      Component Value Range   Color, Urine YELLOW  YELLOW   APPearance CLEAR  CLEAR   Specific Gravity, Urine 1.025  1.005 - 1.030   pH 6.5  5.0 - 8.0   Glucose, UA NEGATIVE  NEGATIVE mg/dL   Hgb urine dipstick NEGATIVE  NEGATIVE   Bilirubin Urine NEGATIVE  NEGATIVE   Ketones, ur NEGATIVE  NEGATIVE mg/dL   Protein, ur NEGATIVE  NEGATIVE mg/dL   Urobilinogen, UA 0.2  0.0 - 1.0 mg/dL   Nitrite NEGATIVE  NEGATIVE   Leukocytes, UA NEGATIVE  NEGATIVE  WET PREP, GENITAL     Status: Abnormal   Collection Time   09/27/12  5:10 PM      Component Value Range   Yeast Wet Prep HPF POC MANY (*) NONE SEEN   Trich, Wet Prep NONE SEEN  NONE SEEN   Clue Cells Wet Prep HPF POC NONE SEEN  NONE SEEN   WBC, Wet Prep HPF POC FEW (*) NONE SEEN  CBC     Status: Abnormal   Collection Time   09/27/12  5:29 PM      Component Value Range   WBC 17.5 (*) 4.0 - 10.5 K/uL   RBC 3.99  3.87 - 5.11 MIL/uL   Hemoglobin 11.9 (*) 12.0 - 15.0 g/dL   HCT 16.1 (*) 09.6 - 04.5 %   MCV 88.2  78.0 - 100.0 fL   MCH 29.8  26.0 - 34.0 pg   MCHC 33.8  30.0 - 36.0 g/dL   RDW 40.9  81.1 - 91.4 %   Platelets 222  150 - 400 K/uL   *RADIOLOGY REPORT*   Clinical Data: Unsure of dates.   LIMITED OBSTETRIC ULTRASOUND  Number of Fetuses: 1  Heart Rate: 152 bpm  Movement: Noted  Presentation: Cephalic  Placental Location: Anterior  Previa: None  Amniotic Fluid (Subjective): Normal  Vertical Pocket 6.4 cm  BPD: 3.8 cm 17 w 4 d EDC: March 03, 2013   MATERNAL FINDINGS:  Cervix: 3.8 cm  Uterus/Adnexae: Unremarkable   IMPRESSION:  Single intrauterine gestation with gestational age of [redacted] weeks and  4 days with  estimated date of confinement of March 03, 2013.  Heart rate 152.  Recommend followup with non-emergent complete OB 14+ wk US  examination for fetal biometric evaluation and anatomic survey if  not already performed.   Original Report Authenticated By: Viviann Spare  Constance Goltz, M.D.  MAU Course  Procedures None  MDM Discussed patient with Dr. Marice Potter. She recommends referral to HR clinic for BP. Rx for labetalol 200 mg BID #60 to be given to patient.  No explanation for white count. Patient has had a white count at every visit in the system for the last 2 years. No source of infection found at this time. Patient is afebrile and not complaining of pain.   Assessment and Plan  A: IUP at 17w 4d Yeast infection Chronic hypertension Late prenatal care  P: Discharge home Rx for Diflucan, Prenatal vitamins and Labetalol sent to patient's pharmacy Referral to HR clinic sent. Patient will be contacted with appointment Pregnancy confirmation letter given Patient may return to MAU as needed or if her condition were to change or worsen  Freddi Starr, PA-C 09/27/2012, 4:47 PM

## 2012-10-11 ENCOUNTER — Encounter: Payer: Medicaid Other | Admitting: Family

## 2012-10-18 ENCOUNTER — Encounter: Payer: Self-pay | Admitting: Family

## 2012-10-20 ENCOUNTER — Encounter (HOSPITAL_COMMUNITY): Payer: Self-pay | Admitting: *Deleted

## 2012-10-20 ENCOUNTER — Emergency Department (HOSPITAL_COMMUNITY)
Admission: EM | Admit: 2012-10-20 | Discharge: 2012-10-20 | Discharge status: Against Medical Advice | Payer: Self-pay | Attending: Emergency Medicine | Admitting: Emergency Medicine

## 2012-10-20 DIAGNOSIS — F1110 Opioid abuse, uncomplicated: Secondary | ICD-10-CM | POA: Insufficient documentation

## 2012-10-20 DIAGNOSIS — L02211 Cutaneous abscess of abdominal wall: Secondary | ICD-10-CM

## 2012-10-20 DIAGNOSIS — Z79899 Other long term (current) drug therapy: Secondary | ICD-10-CM | POA: Insufficient documentation

## 2012-10-20 DIAGNOSIS — Z8659 Personal history of other mental and behavioral disorders: Secondary | ICD-10-CM | POA: Insufficient documentation

## 2012-10-20 DIAGNOSIS — Z87442 Personal history of urinary calculi: Secondary | ICD-10-CM | POA: Insufficient documentation

## 2012-10-20 DIAGNOSIS — L02219 Cutaneous abscess of trunk, unspecified: Secondary | ICD-10-CM | POA: Insufficient documentation

## 2012-10-20 DIAGNOSIS — O99330 Smoking (tobacco) complicating pregnancy, unspecified trimester: Secondary | ICD-10-CM | POA: Insufficient documentation

## 2012-10-20 DIAGNOSIS — Z22322 Carrier or suspected carrier of Methicillin resistant Staphylococcus aureus: Secondary | ICD-10-CM | POA: Insufficient documentation

## 2012-10-20 DIAGNOSIS — Z8632 Personal history of gestational diabetes: Secondary | ICD-10-CM | POA: Insufficient documentation

## 2012-10-20 DIAGNOSIS — O9989 Other specified diseases and conditions complicating pregnancy, childbirth and the puerperium: Secondary | ICD-10-CM | POA: Insufficient documentation

## 2012-10-20 DIAGNOSIS — L03319 Cellulitis of trunk, unspecified: Secondary | ICD-10-CM | POA: Insufficient documentation

## 2012-10-20 HISTORY — DX: Encounter for supervision of normal pregnancy, unspecified, unspecified trimester: Z34.90

## 2012-10-20 MED ORDER — ACETAMINOPHEN 500 MG PO TABS
1000.0000 mg | ORAL_TABLET | Freq: Once | ORAL | Status: AC
  Administered 2012-10-20: 1000 mg via ORAL
  Filled 2012-10-20: qty 2

## 2012-10-20 MED ORDER — LIDOCAINE HCL (PF) 1 % IJ SOLN
INTRAMUSCULAR | Status: AC
  Administered 2012-10-20: 15:00:00
  Filled 2012-10-20: qty 10

## 2012-10-20 MED ORDER — CEPHALEXIN 500 MG PO CAPS: 500.0000 mg | ORAL_CAPSULE | Freq: Four times a day (QID) | ORAL | Status: DC

## 2012-10-20 NOTE — ED Notes (Addendum)
Wound care provided.  Pt ambulatory out of ED prior to discharge instruction

## 2012-10-20 NOTE — ED Notes (Signed)
Pt left AMA, prescription written for Keflex, called pt to try to notify her of antibiotic prescription but had to leave a message.

## 2012-10-20 NOTE — ED Notes (Addendum)
Pt ambulatory to nurses station and requests something for pain other than Tylenol, MD made aware, no new orders received.  States that "I don't really want to be cut on either."  Pt already out of gown and back into her clothing.

## 2012-10-20 NOTE — ED Notes (Signed)
Pt allowed the incision and drainage by the PA.

## 2012-10-20 NOTE — ED Notes (Addendum)
Pt c/o abscess to lower abd area that started a week ago, pt states that she has been trying to "pop" it and it has been draining "green stuff". Pt is currently ?20-[redacted] weeks pregnant, EDD 03/03/2013, denies having any problems with the pregnancy,

## 2012-10-20 NOTE — ED Provider Notes (Signed)
   I was asked by the EDP to perform an I&D on an abscess to the patient's right lower abdominal wall. This was my only involvement in this patient's care   INCISION AND DRAINAGE Performed by: Maxwell Caul. Consent: Verbal consent obtained. Risks and benefits: risks, benefits and alternatives were discussed Type: abscess  Body area: right lower abdominal wall   Anesthesia: local infiltration  Incision was made with a #11 scalpel.  Local anesthetic: lidocaine 1% w/o epinephrine  Anesthetic total: 3 ml  Complexity: complex Blunt dissection to break up loculations  Drainage: purulent  Drainage amount: moderate  Packing material: 1/4 in iodoform gauze  Patient tolerance: Patient tolerated the procedure well with no immediate complications.    Area was cleaned and bandaged.  Bleeding was minimal.  patient agrees to warm wet compresses, packing removal in two days.    Arly Salminen L. [place], [place] 10/20/12 1519

## 2012-10-20 NOTE — ED Provider Notes (Signed)
History     CSN: [id number]  Arrival date & time 10/20/12  1140   First MD Initiated Contact with Patient 10/20/12 1256      Chief Complaint  Patient presents with  . Abscess     HPI Pt was seen at 1300.   Per pt, c/o gradual onset and persistence of constant right lower abd wall "abscess" that began approx 1 week ago.  States she has been "trying to pop it" and it had drained "green stuff."  Pt is G3 P1, EDC 03/03/2013, with EGA [redacted] weeks and 6/7 days.  Denies fevers, no abd pain, no vaginal bleeding/discharge, no LOF, no back pain, no N/V/D.      Past Medical History  Diagnosis Date  . Kidney stone   . Kidney stone   . MRSA (methicillin resistant staph aureus) culture positive   . 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 Anxiety     currently no meds  . Pregnancy induced hypertension     first preg  . Gestational diabetes     first preg  . Drug abuse, opioid type     released from [facility] for falsifying prescriptions  . Pregnant     Past Surgical History  Procedure Laterality Date  . Wisdom tooth extraction  1995    Family History  Problem Relation Age of Onset  . Hypertension Mother     History  Substance Use Topics  . Smoking status: Current Every Day Smoker -- 0.50 packs/day for 12 years    Types: Cigarettes  . Smokeless tobacco: Never Used  . Alcohol Use: No    OB History   Grav Para Term Preterm Abortions TAB SAB Ect Mult Living   3 1 1  0 0 0 0 0 0 1      Review of Systems ROS: Statement: All systems negative except as marked or noted in the HPI; Constitutional: Negative for fever and chills. ; ; Eyes: Negative for eye pain, redness and discharge. ; ; ENMT: Negative for ear pain, hoarseness, nasal congestion, sinus pressure and sore throat. ; ; Cardiovascular: Negative for chest pain, palpitations, diaphoresis, dyspnea and peripheral edema. ; ; Respiratory: Negative for cough, wheezing and stridor. ; ; Gastrointestinal: Negative for  nausea, vomiting, diarrhea, abdominal pain, blood in stool, hematemesis, jaundice and rectal bleeding. . ; ; Genitourinary: Negative for dysuria, flank pain and hematuria. ; ; Musculoskeletal: Negative for back pain and neck pain. Negative for swelling and trauma.; ; Skin: +abscess. Negative for pruritus, rash, abrasions, blisters, bruising and skin lesion.; ; Neuro: Negative for headache, lightheadedness and neck stiffness. Negative for weakness, altered level of consciousness , altered mental status, extremity weakness, paresthesias, involuntary movement, seizure and syncope.       Allergies  Citalopram hydrobromide; Propoxyphene-acetaminophen; and Tramadol  Home Medications   Current Outpatient Rx  Name  Route  Sig  Dispense  Refill  . acetaminophen (TYLENOL) 500 MG tablet   Oral   Take 1,000 mg by mouth every 6 (six) hours as needed. For pain in back         . labetalol (NORMODYNE) 100 MG tablet   Oral   Take 100 mg by mouth 2 (two) times daily.         . Prenatal Vit-Fe Fumarate-FA (MULTIVITAMIN-PRENATAL) 27-0.8 MG TABS   Oral   Take 1 tablet by mouth daily.   30 each   6   . cephALEXin (KEFLEX) 500 MG capsule  Oral   Take 1 capsule (500 mg total) by mouth 4 (four) times daily.   40 capsule   0     BP 153/70  Pulse 86  Temp(Src) 98 F (36.7 C)  Resp 18  Ht 5\' 8"  (1.727 m)  Wt 193 lb (87.544 kg)  BMI 29.35 kg/m2  SpO2 100%  LMP 04/22/2012  Physical Exam 1305: Physical examination:  Nursing notes reviewed; Vital signs and O2 SAT reviewed;  Constitutional: Well developed, Well nourished, Well hydrated, In no acute distress; Head:  Normocephalic, atraumatic; Eyes: EOMI, PERRL, No scleral icterus; ENMT: Mouth and pharynx normal, Mucous membranes moist; Neck: Supple, Full range of motion, No lymphadenopathy; Cardiovascular: Regular rate and rhythm, No gallop; Respiratory: Breath sounds clear & equal bilaterally, No rales, rhonchi, wheezes.  Speaking full sentences  with ease, Normal respiratory effort/excursion; Chest: Nontender, Movement normal; Abdomen: Soft, Nontender, Nondistended, +gravid. Normal bowel sounds; Genitourinary: No CVA tenderness; Extremities: Pulses normal, No tenderness, No edema, No calf edema or asymmetry.; Neuro: AA&Ox3, Major CN grossly intact.  Speech clear. Climbs on and off stretcher easily by herself. No gross focal motor or sensory deficits in extremities.; Skin: Color normal, Warm, Dry; +two approx <1cm diameter areas of fluctuance on right lower abd wall, with mild surrounding erythema, one abscess with central pointing area and scab but no active drainage.    ED Course  Procedures    MDM  MDM Reviewed: nursing note, vitals and previous chart      1500:  Pt repetitively questioning multiple providers for narcotic pain meds.  States she "just needs a dose."  Explained, and re-explained, that I will not be giving her anything that would be potentially unsafe for her unborn child.  Pt agreed to I&D (see separate PA note).  Dx d/w pt and family.  Questions answered.  Verb understanding, agreeable to d/c home with outpt f/u.  1521:  Pt left without prescription.                 Laray Anger, DO 10/22/12 1920

## 2012-10-22 NOTE — ED Provider Notes (Signed)
Medical procedure(I&D) only was performed by the non-physician practitioner.  I personally evaluated the patient during the encounter, please see my previous note.   Laray Anger, DO 10/22/12 1921

## 2012-11-06 ENCOUNTER — Emergency Department (HOSPITAL_COMMUNITY)
Admission: EM | Admit: 2012-11-06 | Discharge: 2012-11-06 | Disposition: A | Discharge status: Home / Self Care | Payer: Self-pay | Attending: Emergency Medicine | Admitting: Emergency Medicine

## 2012-11-06 ENCOUNTER — Encounter (HOSPITAL_COMMUNITY): Payer: Self-pay | Admitting: Emergency Medicine

## 2012-11-06 DIAGNOSIS — K089 Disorder of teeth and supporting structures, unspecified: Secondary | ICD-10-CM | POA: Insufficient documentation

## 2012-11-06 DIAGNOSIS — Z8614 Personal history of Methicillin resistant Staphylococcus aureus infection: Secondary | ICD-10-CM | POA: Insufficient documentation

## 2012-11-06 DIAGNOSIS — K0889 Other specified disorders of teeth and supporting structures: Secondary | ICD-10-CM

## 2012-11-06 DIAGNOSIS — Z8632 Personal history of gestational diabetes: Secondary | ICD-10-CM | POA: Insufficient documentation

## 2012-11-06 DIAGNOSIS — R6884 Jaw pain: Secondary | ICD-10-CM | POA: Insufficient documentation

## 2012-11-06 DIAGNOSIS — Z79899 Other long term (current) drug therapy: Secondary | ICD-10-CM | POA: Insufficient documentation

## 2012-11-06 DIAGNOSIS — Z87442 Personal history of urinary calculi: Secondary | ICD-10-CM | POA: Insufficient documentation

## 2012-11-06 DIAGNOSIS — Z8659 Personal history of other mental and behavioral disorders: Secondary | ICD-10-CM | POA: Insufficient documentation

## 2012-11-06 DIAGNOSIS — R109 Unspecified abdominal pain: Secondary | ICD-10-CM | POA: Insufficient documentation

## 2012-11-06 DIAGNOSIS — F17200 Nicotine dependence, unspecified, uncomplicated: Secondary | ICD-10-CM | POA: Insufficient documentation

## 2012-11-06 DIAGNOSIS — R220 Localized swelling, mass and lump, head: Secondary | ICD-10-CM | POA: Insufficient documentation

## 2012-11-06 MED ORDER — CEPHALEXIN 500 MG PO CAPS: 500.0000 mg | ORAL_CAPSULE | Freq: Four times a day (QID) | ORAL | Status: DC

## 2012-11-06 MED ORDER — HYDROCODONE-ACETAMINOPHEN 5-325 MG PO TABS: 1.0000 | ORAL_TABLET | ORAL | Status: DC | PRN

## 2012-11-06 NOTE — ED Notes (Addendum)
L 2nd bicuspid broken posteriorly, cavity noted. Has been taking Tylenol frequently, but states she has developed gastric burning from it and took Prevacid yesterday.   Patient states dentist had put packing and temporary filling in it before, but filling fell out and now tooth is broken.  Dentist told her she needed root canal, but at the time she did not have funds to pay for it.

## 2012-11-06 NOTE — ED Notes (Signed)
Pt here for toothache. Pt states unable to see dentist until medicaid is active.

## 2012-11-06 NOTE — ED Provider Notes (Signed)
History     CSN: [id number]  Arrival date & time 11/06/12  4098   First MD Initiated Contact with Patient 11/06/12 [phone]      Chief Complaint  Patient presents with  . Dental Pain    (Consider location/radiation/quality/duration/timing/severity/associated sxs/prior treatment) Patient is a 32 y.o. female presenting with tooth pain. The history is provided by the patient.  Dental PainThe primary symptoms include mouth pain. Primary symptoms do not include shortness of breath or cough. The symptoms began more than 1 month ago. The symptoms are unchanged. The symptoms occur frequently.  Additional symptoms include: gum swelling and jaw pain. Additional symptoms do not include: trouble swallowing and nosebleeds. Medical issues include: smoking.    Past Medical History  Diagnosis Date  . Kidney stone   . Kidney stone   . MRSA (methicillin resistant staph aureus) culture positive   . 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 Anxiety     currently no meds  . Pregnancy induced hypertension     first preg  . Gestational diabetes     first preg  . Drug abuse, opioid type     released from [facility] for falsifying prescriptions  . Pregnant     Past Surgical History  Procedure Laterality Date  . Wisdom tooth extraction  1995    Family History  Problem Relation Age of Onset  . Hypertension Mother     History  Substance Use Topics  . Smoking status: Current Every Day Smoker -- 0.50 packs/day for 12 years    Types: Cigarettes  . Smokeless tobacco: Never Used  . Alcohol Use: No    OB History   Grav Para Term Preterm Abortions TAB SAB Ect Mult Living   3 1 1  0 0 0 0 0 0 1      Review of Systems  Constitutional: Negative for activity change.       All ROS Neg except as noted in HPI  HENT: Positive for dental problem. Negative for nosebleeds, trouble swallowing and neck pain.   Eyes: Negative for photophobia and discharge.  Respiratory: Negative for  cough, shortness of breath and wheezing.   Cardiovascular: Negative for chest pain and palpitations.  Gastrointestinal: Negative for abdominal pain and blood in stool.  Genitourinary: Positive for flank pain. Negative for dysuria, frequency and hematuria.  Musculoskeletal: Negative for back pain and arthralgias.  Skin: Negative.   Neurological: Negative for dizziness, seizures and speech difficulty.  Psychiatric/Behavioral: Negative for hallucinations and confusion. The patient is nervous/anxious.     Allergies  Citalopram hydrobromide; Propoxyphene-acetaminophen; and Tramadol  Home Medications   Current Outpatient Rx  Name  Route  Sig  Dispense  Refill  . acetaminophen (TYLENOL) 500 MG tablet   Oral   Take 1,000 mg by mouth every 6 (six) hours as needed. For pain in back         . cephALEXin (KEFLEX) 500 MG capsule   Oral   Take 1 capsule (500 mg total) by mouth 4 (four) times daily.   40 capsule   0   . labetalol (NORMODYNE) 100 MG tablet   Oral   Take 100 mg by mouth 2 (two) times daily.         . Prenatal Vit-Fe Fumarate-FA (MULTIVITAMIN-PRENATAL) 27-0.8 MG TABS   Oral   Take 1 tablet by mouth daily.   30 each   6     BP 148/78  Pulse 80  Temp(Src) 98.4  F (36.9 C) (Oral)  Resp 16  Ht 5\' 8"  (1.727 m)  Wt 190 lb (86.183 kg)  BMI 28.9 kg/m2  SpO2 100%  LMP 04/22/2012  Physical Exam  Nursing note and vitals reviewed. Constitutional: She is oriented to person, place, and time. She appears well-developed and well-nourished.  Non-toxic appearance.  HENT:  Head: Normocephalic.  Right Ear: Tympanic membrane and external ear normal.  Left Ear: Tympanic membrane and external ear normal.  Pain to palpation of the left upper 1st molar. Mild swelling around the gum. No visible abscess. Air way patent. No swelling under the tongue.  Eyes: EOM and lids are normal. Pupils are equal, round, and reactive to light.  Neck: Normal range of motion. Neck supple. Carotid  bruit is not present.  Cardiovascular: Normal rate, regular rhythm, normal heart sounds, intact distal pulses and normal pulses.   Pulmonary/Chest: Breath sounds normal. No respiratory distress.  Abdominal: Soft. Bowel sounds are normal. There is no tenderness. There is no guarding.  Musculoskeletal: Normal range of motion.  Lymphadenopathy:       Head (right side): No submandibular adenopathy present.       Head (left side): No submandibular adenopathy present.    She has no cervical adenopathy.  Neurological: She is alert and oriented to person, place, and time. She has normal strength. No cranial nerve deficit or sensory deficit.  Skin: Skin is warm and dry.  Psychiatric: She has a normal mood and affect. Her speech is normal.    ED Course  Procedures (including critical care time)  Labs Reviewed - No data to display No results found.   No diagnosis found.    MDM  I have reviewed nursing notes, vital signs, and all appropriate lab and imaging results for this patient. Dental resources given to the patient. Rx for keflex and norco given to the patient. Pt encouraged to see a dentist as soon as possible. Vital signs stable. Airway patent. No evidence for Ludwik's  Angina.       Kathie Dike, PA-C 11/07/12 [phone]

## 2012-11-06 NOTE — ED Notes (Signed)
Patient with no complaints at this time. Respirations even and unlabored. Skin warm/dry. Discharge instructions reviewed with patient at this time. Patient given opportunity to voice concerns/ask questions. Patient discharged at this time and left Emergency Department with steady gait.   

## 2012-11-07 NOTE — ED Provider Notes (Signed)
Medical screening examination/treatment/procedure(s) were performed by non-physician practitioner and as supervising physician I was immediately available for consultation/collaboration.   Joya Gaskins, MD 11/07/12 [phone]

## 2012-11-12 ENCOUNTER — Emergency Department (HOSPITAL_BASED_OUTPATIENT_CLINIC_OR_DEPARTMENT_OTHER)
Admission: EM | Admit: 2012-11-12 | Discharge: 2012-11-12 | Disposition: A | Discharge status: Home / Self Care | Payer: Self-pay | Attending: Emergency Medicine | Admitting: Emergency Medicine

## 2012-11-12 ENCOUNTER — Encounter (HOSPITAL_BASED_OUTPATIENT_CLINIC_OR_DEPARTMENT_OTHER): Payer: Self-pay | Admitting: Family Medicine

## 2012-11-12 DIAGNOSIS — F17200 Nicotine dependence, unspecified, uncomplicated: Secondary | ICD-10-CM | POA: Insufficient documentation

## 2012-11-12 DIAGNOSIS — Z8659 Personal history of other mental and behavioral disorders: Secondary | ICD-10-CM | POA: Insufficient documentation

## 2012-11-12 DIAGNOSIS — Z87442 Personal history of urinary calculi: Secondary | ICD-10-CM | POA: Insufficient documentation

## 2012-11-12 DIAGNOSIS — Z8632 Personal history of gestational diabetes: Secondary | ICD-10-CM | POA: Insufficient documentation

## 2012-11-12 DIAGNOSIS — K029 Dental caries, unspecified: Secondary | ICD-10-CM | POA: Insufficient documentation

## 2012-11-12 DIAGNOSIS — Z8614 Personal history of Methicillin resistant Staphylococcus aureus infection: Secondary | ICD-10-CM | POA: Insufficient documentation

## 2012-11-12 DIAGNOSIS — H9209 Otalgia, unspecified ear: Secondary | ICD-10-CM | POA: Insufficient documentation

## 2012-11-12 MED ORDER — HYDROCODONE-ACETAMINOPHEN 5-325 MG PO TABS: 1.0000 | ORAL_TABLET | Freq: Four times a day (QID) | ORAL | Status: DC | PRN

## 2012-11-12 MED ORDER — BUPIVACAINE-EPINEPHRINE (PF) 0.5% -1:200000 IJ SOLN
INTRAMUSCULAR | Status: AC
  Administered 2012-11-12: 10:00:00
  Filled 2012-11-12: qty 1.8

## 2012-11-12 NOTE — ED Provider Notes (Addendum)
History     CSN: [id number]  Arrival date & time 11/12/12  0913   First MD Initiated Contact with Patient 11/12/12 [phone]      Chief Complaint  Patient presents with  . Dental Problem    (Consider location/radiation/quality/duration/timing/severity/associated sxs/prior treatment) HPI Comments: Pt seen at [facility] last week for same.  States she took her abx and pain meds but unable to see the dentist till Friday and pain has returned  Patient is a 32 y.o. female presenting with tooth pain. The history is provided by the patient.  Dental PainThe primary symptoms include mouth pain. Primary symptoms do not include headaches, fever or sore throat. Episode onset: 2 weeks. The symptoms are worsening. The symptoms are recurrent. The symptoms occur constantly.  Additional symptoms include: dental sensitivity to temperature, gum tenderness, jaw pain and ear pain. Additional symptoms do not include: gum swelling, facial swelling, trouble swallowing and pain with swallowing. Medical issues include: periodontal disease.    Past Medical History  Diagnosis Date  . Kidney stone   . Kidney stone   . MRSA (methicillin resistant staph aureus) culture positive   . 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 Anxiety     currently no meds  . Pregnancy induced hypertension     first preg  . Gestational diabetes     first preg  . Drug abuse, opioid type     released from [facility] for falsifying prescriptions  . Pregnant     Past Surgical History  Procedure Laterality Date  . Wisdom tooth extraction  1995    Family History  Problem Relation Age of Onset  . Hypertension Mother     History  Substance Use Topics  . Smoking status: Current Every Day Smoker -- 0.50 packs/day for 12 years    Types: Cigarettes  . Smokeless tobacco: Never Used  . Alcohol Use: No    OB History   Grav Para Term Preterm Abortions TAB SAB Ect Mult Living   3 1 1  0 0 0 0 0 0 1      Review of  Systems  Constitutional: Negative for fever.  HENT: Positive for ear pain. Negative for sore throat, facial swelling and trouble swallowing.   Neurological: Negative for headaches.  All other systems reviewed and are negative.    Allergies  Citalopram hydrobromide; Propoxyphene-acetaminophen; and Tramadol  Home Medications   Current Outpatient Rx  Name  Route  Sig  Dispense  Refill  . acetaminophen (TYLENOL) 500 MG tablet   Oral   Take 1,000 mg by mouth every 6 (six) hours as needed. For pain in back         . cephALEXin (KEFLEX) 500 MG capsule   Oral   Take 1 capsule (500 mg total) by mouth 4 (four) times daily.   40 capsule   0   . cephALEXin (KEFLEX) 500 MG capsule   Oral   Take 1 capsule (500 mg total) by mouth 4 (four) times daily.   28 capsule   0   . HYDROcodone-acetaminophen (NORCO/VICODIN) 5-325 MG per tablet   Oral   Take 1 tablet by mouth every 4 (four) hours as needed for pain.   16 tablet   0   . labetalol (NORMODYNE) 100 MG tablet   Oral   Take 100 mg by mouth 2 (two) times daily.         . Prenatal Vit-Fe Fumarate-FA (MULTIVITAMIN-PRENATAL) 27-0.8 MG TABS  Oral   Take 1 tablet by mouth daily.   30 each   6     BP 150/92  Pulse 83  Temp(Src) 97.5 F (36.4 C) (Oral)  Resp 20  SpO2 99%  LMP 04/22/2012  Physical Exam  Nursing note and vitals reviewed. Constitutional: She is oriented to person, place, and time. She appears well-developed and well-nourished. No distress.  HENT:  Head: Normocephalic and atraumatic.  Mouth/Throat: Oropharynx is clear and moist. Dental caries present.    Eyes: Conjunctivae and EOM are normal. Pupils are equal, round, and reactive to light.  Neck: Normal range of motion. Neck supple.  Cardiovascular: Normal rate.   Pulmonary/Chest: Effort normal.  Abdominal: Soft.  Musculoskeletal: Normal range of motion. She exhibits no edema and no tenderness.  Lymphadenopathy:    She has no cervical adenopathy.   Neurological: She is alert and oriented to person, place, and time.  Skin: Skin is warm and dry. No rash noted. No erythema.  Psychiatric: She has a normal mood and affect. Her behavior is normal.    ED Course  Dental Date/Time: 11/12/2012 9:45 AM Performed by: Gwyneth Sprout Authorized by: Gwyneth Sprout Consent: Verbal consent obtained. Risks and benefits: risks, benefits and alternatives were discussed Consent given by: patient Local anesthesia used: yes Anesthesia: local infiltration (dental block) Local anesthetic: bupivacaine 0.5% without epinephrine Anesthetic total: 2 ml Patient sedated: no Patient tolerance: Patient tolerated the procedure well with no immediate complications. Comments: Significant improvement of dental pain after apical block   (including critical care time)  Labs Reviewed - No data to display No results found.   1. Infected dental caries       MDM   Pt with dental caries and no facial swelling.  No signs of ludwig's angina or difficulty swallowing and no systemic symptoms. Will treat with dental block and pain meds and have pt f/u with dentist on Friday as planned.         Gwyneth Sprout, MD 11/12/12 [id number]  Gwyneth Sprout, MD 11/12/12 [phone]

## 2012-11-12 NOTE — ED Notes (Addendum)
Pt c/o left upper "tooth problem", pt sts she has a temporary filling to same and is supposed to get a root canal but cannot afford it. Pt was seen at [facility] last week and treated with pain med and antibiotic. Pt did not inform this nurse of prior treatment.

## 2012-11-19 ENCOUNTER — Encounter: Payer: Self-pay | Admitting: Obstetrics and Gynecology

## 2012-11-19 ENCOUNTER — Other Ambulatory Visit: Payer: Self-pay | Admitting: Obstetrics and Gynecology

## 2012-11-19 ENCOUNTER — Ambulatory Visit (INDEPENDENT_AMBULATORY_CARE_PROVIDER_SITE_OTHER): Payer: Self-pay | Admitting: Obstetrics and Gynecology

## 2012-11-19 VITALS — BP 138/82 | Temp 97.0°F | Wt 191.0 lb

## 2012-11-19 DIAGNOSIS — O2390 Unspecified genitourinary tract infection in pregnancy, unspecified trimester: Secondary | ICD-10-CM

## 2012-11-19 DIAGNOSIS — O0992 Supervision of high risk pregnancy, unspecified, second trimester: Secondary | ICD-10-CM

## 2012-11-19 DIAGNOSIS — O0930 Supervision of pregnancy with insufficient antenatal care, unspecified trimester: Secondary | ICD-10-CM

## 2012-11-19 DIAGNOSIS — O0932 Supervision of pregnancy with insufficient antenatal care, second trimester: Secondary | ICD-10-CM

## 2012-11-19 DIAGNOSIS — I10 Essential (primary) hypertension: Secondary | ICD-10-CM | POA: Insufficient documentation

## 2012-11-19 DIAGNOSIS — O0990 Supervision of high risk pregnancy, unspecified, unspecified trimester: Secondary | ICD-10-CM | POA: Insufficient documentation

## 2012-11-19 DIAGNOSIS — Z302 Encounter for sterilization: Secondary | ICD-10-CM

## 2012-11-19 DIAGNOSIS — O162 Unspecified maternal hypertension, second trimester: Secondary | ICD-10-CM

## 2012-11-19 DIAGNOSIS — B951 Streptococcus, group B, as the cause of diseases classified elsewhere: Secondary | ICD-10-CM

## 2012-11-19 DIAGNOSIS — O169 Unspecified maternal hypertension, unspecified trimester: Secondary | ICD-10-CM

## 2012-11-19 DIAGNOSIS — O1410 Severe pre-eclampsia, unspecified trimester: Secondary | ICD-10-CM | POA: Insufficient documentation

## 2012-11-19 LAB — POCT URINALYSIS DIP (DEVICE)
Nitrite: NEGATIVE
Protein, ur: 30 mg/dL — AB
Urobilinogen, UA: 0.2 mg/dL (ref 0.0–1.0)

## 2012-11-19 MED ORDER — OXYCODONE HCL 5 MG PO TABS: 5.0000 mg | ORAL_TABLET | ORAL | Status: DC | PRN

## 2012-11-19 NOTE — Progress Notes (Signed)
Patient never took prescribed labetalol 200 mg BID. Monitor BP closely. Patient reports receiving 10 mg oxycodone from podiatrist that she takes on a daily basis. Patient states that she has been out of oxycodone for the past 2 days and is having a lot of abdominal pain, emesis and diarrhea as a result. Patient is requesting a refill on Rx as podiatrist is no longer prescribing. Informed patient that she exhibits signs of dependence and withdrawal. Will refer to social worker and encouraged patient to enroll in a rehab program. Informed patient of fetal and neonatal side effects of narcotic dependence. Rx percocet provided. Patient informed that without medical records demonstrating a need for narcotics, no further prescription will be provided from this office.  Subjective:    Rebecca [name] is a [id number] [redacted]w[redacted]d being seen today for her first obstetrical visit.  Her obstetrical history is significant for CHTN, opiod abuse, H/o PIH with last pregnancy. Patient does intend to breast feed. Pregnancy history fully reviewed.  Patient reports no complaints.  Filed Vitals:   11/19/12 0857  BP: 138/82  Temp: 97 F (36.1 C)  Weight: 191 lb (86.637 kg)    HISTORY: OB History   Grav Para Term Preterm Abortions TAB SAB Ect Mult Living   3 2 2  0 0 0 0 0 0 2     # Outc Date GA Lbr Len/2nd Wgt Sex Del Anes PTL Lv   1 TRM 11/01 [redacted]w[redacted]d  [id number](1.914NW) M SVD   Yes   2 TRM     M SVD EPI  Yes   3 CUR              Past Medical History  Diagnosis Date  . MRSA (methicillin resistant staph aureus) culture positive   . Anxiety     currently no meds  . Pregnancy induced hypertension     first preg  . Gestational diabetes     first preg  . Drug abuse, opioid type     released from [facility] for falsifying prescriptions  . Pregnant   . Kidney stone   . Kidney stone   . Mental disorder 04/2011    states was advised for detox but couldnt due to 1st pregnancy   Past Surgical History  Procedure  Laterality Date  . Wisdom tooth extraction  1995   Family History  Problem Relation Age of Onset  . Hypertension Mother      Exam    Uterus:     Pelvic Exam:    Perineum: Normal Perineum   Vulva: normal   Vagina:  normal mucosa   pH:    Cervix: closed, long, post   Adnexa: not evaluated   Bony Pelvis: android  System: Breast:  normal appearance, no masses or tenderness   Skin: normal coloration and turgor, no rashes    Neurologic: oriented, no focal deficits   Extremities: normal strength, tone, and muscle mass   HEENT extra ocular movement intact   Mouth/Teeth mucous membranes moist, pharynx normal without lesions   Neck supple and no masses   Cardiovascular: regular rate and rhythm   Respiratory:  chest clear, no wheezing, crepitations, rhonchi, normal symmetric air entry   Abdomen: soft, gravid, NT   Urinary:       Assessment:    Pregnancy: [id number] Patient Active Problem List  Diagnosis  . WART, LEFT HAND  . POLYCYSTIC OVARIES  . DRUG WITHDRAWAL  . ANXIETY  . TOBACCO ABUSE  . DEPRESSION  . VIRAL  URI  . RHINITIS, ALLERGIC NOS  . ASTHMA NOS W/ACUTE EXACERBATION  . ABSCESS, FACE  . NECK PAIN  . BACK PAIN, LUMBAR, CHRONIC  . MUSCLE PAIN  . LEG PAIN, RIGHT  . HEADACHE  . KNEE SPRAIN, RIGHT  . Opioid dependence  . Normal pregnancy, repeat  . Supervision of high-risk pregnancy  . Hypertension  . Hypertension complicating pregnancy  . Pregnancy, high-risk, obstetrical care insufficient  . Desires sterilization        Plan:     Initial labs drawn. Prenatal vitamins. Problem list reviewed and updated. Genetic Screening discussed : too late.  Ultrasound discussed; fetal survey: ordered.  Follow up in 2 weeks. Reviewed healthy eating and exercise in pregnancy Patient denies h/o GDM She reports postpartum PIH with last pregnancy Patient currently not on any hypertensive- will monitor BP closely Patient desires BTL  50% of 30 min visit spent on  counseling and coordination of care.     Ndia Sampath 11/19/2012

## 2012-11-19 NOTE — Progress Notes (Signed)
Pulse 88. C/o of pain on BLE- seeing a podiatrist.

## 2012-11-19 NOTE — Patient Instructions (Signed)
Pregnancy - Second Trimester The second trimester of pregnancy (3 to 6 months) is a period of rapid growth for you and your baby. At the end of the sixth month, your baby is about 9 inches long and weighs 1 1/2 pounds. You will begin to feel the baby move between 18 and 20 weeks of the pregnancy. This is called quickening. Weight gain is faster. A clear fluid (colostrum) may leak out of your breasts. You may feel small contractions of the womb (uterus). This is known as false labor or Braxton-Hicks contractions. This is like a practice for labor when the baby is ready to be born. Usually, the problems with morning sickness have usually passed by the end of your first trimester. Some women develop small dark blotches (called cholasma, mask of pregnancy) on their face that usually goes away after the baby is born. Exposure to the sun makes the blotches worse. Acne may also develop in some pregnant women and pregnant women who have acne, may find that it goes away. PRENATAL EXAMS  Blood work may continue to be done during prenatal exams. These tests are done to check on your health and the probable health of your baby. Blood work is used to follow your blood levels (hemoglobin). Anemia (low hemoglobin) is common during pregnancy. Iron and vitamins are given to help prevent this. You will also be checked for diabetes between 24 and 28 weeks of the pregnancy. Some of the previous blood tests may be repeated.  The size of the uterus is measured during each visit. This is to make sure that the baby is continuing to grow properly according to the dates of the pregnancy.  Your blood pressure is checked every prenatal visit. This is to make sure you are not getting toxemia.  Your urine is checked to make sure you do not have an infection, diabetes or protein in the urine.  Your weight is checked often to make sure gains are happening at the suggested rate. This is to ensure that both you and your baby are  growing normally.  Sometimes, an ultrasound is performed to confirm the proper growth and development of the baby. This is a test which bounces harmless sound waves off the baby so your caregiver can more accurately determine due dates. Sometimes, a specialized test is done on the amniotic fluid surrounding the baby. This test is called an amniocentesis. The amniotic fluid is obtained by sticking a needle into the belly (abdomen). This is done to check the chromosomes in instances where there is a concern about possible genetic problems with the baby. It is also sometimes done near the end of pregnancy if an early delivery is required. In this case, it is done to help make sure the baby's lungs are mature enough for the baby to live outside of the womb. CHANGES OCCURING IN THE SECOND TRIMESTER OF PREGNANCY Your body goes through many changes during pregnancy. They vary from person to person. Talk to your caregiver about changes you notice that you are concerned about.  During the second trimester, you will likely have an increase in your appetite. It is normal to have cravings for certain foods. This varies from person to person and pregnancy to pregnancy.  Your lower abdomen will begin to bulge.  You may have to urinate more often because the uterus and baby are pressing on your bladder. It is also common to get more bladder infections during pregnancy (pain with urination). You can help this by   drinking lots of fluids and emptying your bladder before and after intercourse.  You may begin to get stretch marks on your hips, abdomen, and breasts. These are normal changes in the body during pregnancy. There are no exercises or medications to take that prevent this change.  You may begin to develop swollen and bulging veins (varicose veins) in your legs. Wearing support hose, elevating your feet for 15 minutes, 3 to 4 times a day and limiting salt in your diet helps lessen the problem.  Heartburn may  develop as the uterus grows and pushes up against the stomach. Antacids recommended by your caregiver helps with this problem. Also, eating smaller meals 4 to 5 times a day helps.  Constipation can be treated with a stool softener or adding bulk to your diet. Drinking lots of fluids, vegetables, fruits, and whole grains are helpful.  Exercising is also helpful. If you have been very active up until your pregnancy, most of these activities can be continued during your pregnancy. If you have been less active, it is helpful to start an exercise program such as walking.  Hemorrhoids (varicose veins in the rectum) may develop at the end of the second trimester. Warm sitz baths and hemorrhoid cream recommended by your caregiver helps hemorrhoid problems.  Backaches may develop during this time of your pregnancy. Avoid heavy lifting, wear low heal shoes and practice good posture to help with backache problems.  Some pregnant women develop tingling and numbness of their hand and fingers because of swelling and tightening of ligaments in the wrist (carpel tunnel syndrome). This goes away after the baby is born.  As your breasts enlarge, you may have to get a bigger bra. Get a comfortable, cotton, support bra. Do not get a nursing bra until the last month of the pregnancy if you will be nursing the baby.  You may get a dark line from your belly button to the pubic area called the linea nigra.  You may develop rosy cheeks because of increase blood flow to the face.  You may develop spider looking lines of the face, neck, arms and chest. These go away after the baby is born. HOME CARE INSTRUCTIONS   It is extremely important to avoid all smoking, herbs, alcohol, and unprescribed drugs during your pregnancy. These chemicals affect the formation and growth of the baby. Avoid these chemicals throughout the pregnancy to ensure the delivery of a healthy infant.  Most of your home care instructions are the same  as suggested for the first trimester of your pregnancy. Keep your caregiver's appointments. Follow your caregiver's instructions regarding medication use, exercise and diet.  During pregnancy, you are providing food for you and your baby. Continue to eat regular, well-balanced meals. Choose foods such as meat, fish, milk and other low fat dairy products, vegetables, fruits, and whole-grain breads and cereals. Your caregiver will tell you of the ideal weight gain.  A physical sexual relationship may be continued up until near the end of pregnancy if there are no other problems. Problems could include early (premature) leaking of amniotic fluid from the membranes, vaginal bleeding, abdominal pain, or other medical or pregnancy problems.  Exercise regularly if there are no restrictions. Check with your caregiver if you are unsure of the safety of some of your exercises. The greatest weight gain will occur in the last 2 trimesters of pregnancy. Exercise will help you:  Control your weight.  Get you in shape for labor and delivery.  Lose weight   after you have the baby.  Wear a good support or jogging bra for breast tenderness during pregnancy. This may help if worn during sleep. Pads or tissues may be used in the bra if you are leaking colostrum.  Do not use hot tubs, steam rooms or saunas throughout the pregnancy.  Wear your seat belt at all times when driving. This protects you and your baby if you are in an accident.  Avoid raw meat, uncooked cheese, cat litter boxes and soil used by cats. These carry germs that can cause birth defects in the baby.  The second trimester is also a good time to visit your dentist for your dental health if this has not been done yet. Getting your teeth cleaned is OK. Use a soft toothbrush. Brush gently during pregnancy.  It is easier to loose urine during pregnancy. Tightening up and strengthening the pelvic muscles will help with this problem. Practice stopping  your urination while you are going to the bathroom. These are the same muscles you need to strengthen. It is also the muscles you would use as if you were trying to stop from passing gas. You can practice tightening these muscles up 10 times a set and repeating this about 3 times per day. Once you know what muscles to tighten up, do not perform these exercises during urination. It is more likely to contribute to an infection by backing up the urine.  Ask for help if you have financial, counseling or nutritional needs during pregnancy. Your caregiver will be able to offer counseling for these needs as well as refer you for other special needs.  Your skin may become oily. If so, wash your face with mild soap, use non-greasy moisturizer and oil or cream based makeup. MEDICATIONS AND DRUG USE IN PREGNANCY  Take prenatal vitamins as directed. The vitamin should contain 1 milligram of folic acid. Keep all vitamins out of reach of children. Only a couple vitamins or tablets containing iron may be fatal to a baby or young child when ingested.  Avoid use of all medications, including herbs, over-the-counter medications, not prescribed or suggested by your caregiver. Only take over-the-counter or prescription medicines for pain, discomfort, or fever as directed by your caregiver. Do not use aspirin.  Let your caregiver also know about herbs you may be using.  Alcohol is related to a number of birth defects. This includes fetal alcohol syndrome. All alcohol, in any form, should be avoided completely. Smoking will cause low birth rate and premature babies.  Street or illegal drugs are very harmful to the baby. They are absolutely forbidden. A baby born to an addicted mother will be addicted at birth. The baby will go through the same withdrawal an adult does. SEEK MEDICAL CARE IF:  You have any concerns or worries during your pregnancy. It is better to call with your questions if you feel they cannot wait,  rather than worry about them. SEEK IMMEDIATE MEDICAL CARE IF:   An unexplained oral temperature above 100.4 F (38 C) develops, or as your caregiver suggests.  You have leaking of fluid from the vagina (birth canal). If leaking membranes are suspected, take your temperature and tell your caregiver of this when you call.  There is vaginal spotting, bleeding, or passing clots. Tell your caregiver of the amount and how many pads are used. Light spotting in pregnancy is common, especially following intercourse.  You develop a bad smelling vaginal discharge with a change in the color from clear   to white.  You continue to feel sick to your stomach (nauseated) and have no relief from remedies suggested. You vomit blood or coffee ground-like materials.  You lose more than 2 pounds of weight or gain more than 2 pounds of weight over 1 week, or as suggested by your caregiver.  You notice swelling of your face, hands, feet, or legs.  You get exposed to German measles and have never had them.  You are exposed to fifth disease or chickenpox.  You develop belly (abdominal) pain. Round ligament discomfort is a common non-cancerous (benign) cause of abdominal pain in pregnancy. Your caregiver still must evaluate you.  You develop a bad headache that does not go away.  You develop fever, diarrhea, pain with urination, or shortness of breath.  You develop visual problems, blurry, or double vision.  You fall or are in a car accident or any kind of trauma.  There is mental or physical violence at home. Document Released: 08/09/2001 Document Revised: 11/07/2011 Document Reviewed: 02/11/2009 [employer] Patient Information 2013 [employer].   Breastfeeding Deciding to breastfeed is one of the best choices you can make for you and your baby. The information that follows gives a brief overview of the benefits of breastfeeding as well as common topics surrounding breastfeeding. BENEFITS OF  BREASTFEEDING For the baby  The first milk (colostrum) helps the baby's digestive system function better.   There are antibodies in the mother's milk that help the baby fight off infections.   The baby has a lower incidence of asthma, allergies, and sudden infant death syndrome (SIDS).   The nutrients in breast milk are better for the baby than infant formulas, and breast milk helps the baby's brain grow better.   Babies who breastfeed have less gas, colic, and constipation.  For the mother  Breastfeeding helps develop a very special bond between the mother and her baby.   Breastfeeding is convenient, always available at the correct temperature, and costs nothing.   Breastfeeding burns calories in the mother and helps her lose weight that was gained during pregnancy.   Breastfeeding makes the uterus contract back down to normal size faster and slows bleeding following delivery.   Breastfeeding mothers have a lower risk of developing breast cancer.  BREASTFEEDING FREQUENCY  A healthy, full-term baby may breastfeed as often as every hour or space his or her feedings to every 3 hours.   Watch your baby for signs of hunger. Nurse your baby if he or she shows signs of hunger. How often you nurse will vary from baby to baby.   Nurse as often as the baby requests, or when you feel the need to reduce the fullness of your breasts.   Awaken the baby if it has been 3 4 hours since the last feeding.   Frequent feeding will help the mother make more milk and will help prevent problems, such as sore nipples and engorgement of the breasts.  BABY'S POSITION AT THE BREAST  Whether lying down or sitting, be sure that the baby's tummy is facing your tummy.   Support the breast with 4 fingers underneath the breast and the thumb above. Make sure your fingers are well away from the nipple and baby's mouth.   Stroke the baby's lips gently with your finger or nipple.   When the  baby's mouth is open wide enough, place all of your nipple and as much of the areola as possible into your baby's mouth.   Pull the baby   in close so the tip of the nose and the baby's cheeks touch the breast during the feeding.  FEEDINGS AND SUCTION  The length of each feeding varies from baby to baby and from feeding to feeding.   The baby must suck about 2 3 minutes for your milk to get to him or her. This is called a "let down." For this reason, allow the baby to feed on each breast as long as he or she wants. Your baby will end the feeding when he or she has received the right balance of nutrients.   To break the suction, put your finger into the corner of the baby's mouth and slide it between his or her gums before removing your breast from his or her mouth. This will help prevent sore nipples.  HOW TO TELL WHETHER YOUR BABY IS GETTING ENOUGH BREAST MILK. Wondering whether or not your baby is getting enough milk is a common concern among mothers. You can be assured that your baby is getting enough milk if:   Your baby is actively sucking and you hear swallowing.   Your baby seems relaxed and satisfied after a feeding.   Your baby nurses at least 8 12 times in a 24 hour time period. Nurse your baby until he or she unlatches or falls asleep at the first breast (at least 10 20 minutes), then offer the second side.   Your baby is wetting 5 6 disposable diapers (6 8 cloth diapers) in a 24 hour period by 5 6 days of age.   Your baby is having at least 3 4 stools every 24 hours for the first 6 weeks. The stool should be soft and yellow.   Your baby should gain 4 7 ounces per week after he or she is 4 days old.   Your breasts feel softer after nursing.  REDUCING BREAST ENGORGEMENT  In the first week after your baby is born, you may experience signs of breast engorgement. When breasts are engorged, they feel heavy, warm, full, and may be tender to the touch. You can reduce  engorgement if you:   Nurse frequently, every 2 3 hours. Mothers who breastfeed early and often have fewer problems with engorgement.   Place light ice packs on your breasts for 10 20 minutes between feedings. This reduces swelling. Wrap the ice packs in a lightweight towel to protect your skin. Bags of frozen vegetables work well for this purpose.   Take a warm shower or apply warm, moist heat to your breast for 5 10 minutes just before each feeding. This increases circulation and helps the milk flow.   Gently massage your breast before and during the feeding. Using your finger tips, massage from the chest wall towards your nipple in a circular motion.   Make sure that the baby empties at least one breast at every feeding before switching sides.   Use a breast pump to empty the breasts if your baby is sleepy or not nursing well. You may also want to pump if you are returning to work oryou feel you are getting engorged.   Avoid bottle feeds, pacifiers, or supplemental feedings of water or juice in place of breastfeeding. Breast milk is all the food your baby needs. It is not necessary for your baby to have water or formula. In fact, to help your breasts make more milk, it is best not to give your baby supplemental feedings during the early weeks.   Be sure the baby is   latched on and positioned properly while breastfeeding.   Wear a supportive bra, avoiding underwire styles.   Eat a balanced diet with enough fluids.   Rest often, relax, and take your prenatal vitamins to prevent fatigue, stress, and anemia.  If you follow these suggestions, your engorgement should improve in 24 48 hours. If you are still experiencing difficulty, call your lactation consultant or caregiver.  CARING FOR YOURSELF Take care of your breasts  Bathe or shower daily.   Avoid using soap on your nipples.   Start feedings on your left breast at one feeding and on your right breast at the next  feeding.   You will notice an increase in your milk supply 2 5 days after delivery. You may feel some discomfort from engorgement, which makes your breasts very firm and often tender. Engorgement "peaks" out within 24 48 hours. In the meantime, apply warm moist towels to your breasts for 5 10 minutes before feeding. Gentle massage and expression of some milk before feeding will soften your breasts, making it easier for your baby to latch on.   Wear a well-fitting nursing bra, and air dry your nipples for a 3 4minutes after each feeding.   Only use cotton bra pads.   Only use pure lanolin on your nipples after nursing. You do not need to wash it off before feeding the baby again. Another option is to express a few drops of breast milk and gently massage it into your nipples.  Take care of yourself  Eat well-balanced meals and nutritious snacks.   Drinking milk, fruit juice, and water to satisfy your thirst (about 8 glasses a day).   Get plenty of rest.  Avoid foods that you notice affect the baby in a bad way.  SEEK MEDICAL CARE IF:   You have difficulty with breastfeeding and need help.   You have a hard, red, sore area on your breast that is accompanied by a fever.   Your baby is too sleepy to eat well or is having trouble sleeping.   Your baby is wetting less than 6 diapers a day, by 5 days of age.   Your baby's skin or white part of his or her eyes is more yellow than it was in the hospital.   You feel depressed.  Document Released: 08/15/2005 Document Revised: 02/14/2012 Document Reviewed: 11/13/2011 [employer] Patient Information 2013 [employer].  

## 2012-11-20 LAB — DRUG SCREEN, URINE
Amphetamine Screen, Ur: NEGATIVE
Barbiturate Quant, Ur: NEGATIVE
Cocaine Metabolites: NEGATIVE
Marijuana Metabolite: NEGATIVE
Opiates: POSITIVE — AB

## 2012-11-22 ENCOUNTER — Encounter: Payer: Self-pay | Admitting: Obstetrics and Gynecology

## 2012-11-22 ENCOUNTER — Encounter: Payer: Self-pay | Admitting: Podiatry

## 2012-11-22 ENCOUNTER — Ambulatory Visit (INDEPENDENT_AMBULATORY_CARE_PROVIDER_SITE_OTHER): Payer: Self-pay | Admitting: Podiatry

## 2012-11-22 VITALS — BP 141/72 | HR 82 | Ht 68.0 in | Wt 190.0 lb

## 2012-11-22 DIAGNOSIS — M25579 Pain in unspecified ankle and joints of unspecified foot: Secondary | ICD-10-CM

## 2012-11-22 DIAGNOSIS — M25571 Pain in right ankle and joints of right foot: Secondary | ICD-10-CM

## 2012-11-22 LAB — CULTURE, OB URINE

## 2012-11-22 NOTE — Progress Notes (Signed)
Patient ID: Rebecca [name], female   DOB: March 24, 1981, 32 y.o.   MRN: [id number] Patient was seen on 3/27 by a podiatrist and requested pain medication for foot pain. Patient stated at that visit that a Dr. Okey Dupre prescribed her 10 mg oxycodone. Patient was offered a foot cast by the podiatrist on 3/27 to help alleviate pain and pressure on foot while walking. Patient declined and requested a pain prescription. Patient also reported that she has not initiated care with an obstetrician when she had her initial visit with me on 3/24. This patient was previously discharged from another practise for falsification of prescription. Patient was advised on her 3/24 initial prenatal appointment that narcotics would not be prescribed from our office.
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
## 2012-11-22 NOTE — Patient Instructions (Signed)
Please be examined by an Obstetrician immediately.

## 2012-11-23 ENCOUNTER — Encounter (HOSPITAL_BASED_OUTPATIENT_CLINIC_OR_DEPARTMENT_OTHER): Payer: Self-pay

## 2012-11-23 ENCOUNTER — Telehealth: Payer: Self-pay

## 2012-11-23 ENCOUNTER — Emergency Department (HOSPITAL_BASED_OUTPATIENT_CLINIC_OR_DEPARTMENT_OTHER)
Admission: EM | Admit: 2012-11-23 | Discharge: 2012-11-23 | Disposition: A | Discharge status: Home / Self Care | Payer: Self-pay | Attending: Emergency Medicine | Admitting: Emergency Medicine

## 2012-11-23 ENCOUNTER — Telehealth: Payer: Self-pay | Admitting: *Deleted

## 2012-11-23 DIAGNOSIS — Z8679 Personal history of other diseases of the circulatory system: Secondary | ICD-10-CM | POA: Insufficient documentation

## 2012-11-23 DIAGNOSIS — K0381 Cracked tooth: Secondary | ICD-10-CM | POA: Insufficient documentation

## 2012-11-23 DIAGNOSIS — Z8632 Personal history of gestational diabetes: Secondary | ICD-10-CM | POA: Insufficient documentation

## 2012-11-23 DIAGNOSIS — B951 Streptococcus, group B, as the cause of diseases classified elsewhere: Secondary | ICD-10-CM | POA: Insufficient documentation

## 2012-11-23 DIAGNOSIS — K0889 Other specified disorders of teeth and supporting structures: Secondary | ICD-10-CM

## 2012-11-23 DIAGNOSIS — R220 Localized swelling, mass and lump, head: Secondary | ICD-10-CM | POA: Insufficient documentation

## 2012-11-23 DIAGNOSIS — Z87442 Personal history of urinary calculi: Secondary | ICD-10-CM | POA: Insufficient documentation

## 2012-11-23 DIAGNOSIS — K089 Disorder of teeth and supporting structures, unspecified: Secondary | ICD-10-CM | POA: Insufficient documentation

## 2012-11-23 DIAGNOSIS — Z8614 Personal history of Methicillin resistant Staphylococcus aureus infection: Secondary | ICD-10-CM | POA: Insufficient documentation

## 2012-11-23 DIAGNOSIS — R221 Localized swelling, mass and lump, neck: Secondary | ICD-10-CM | POA: Insufficient documentation

## 2012-11-23 DIAGNOSIS — Z8659 Personal history of other mental and behavioral disorders: Secondary | ICD-10-CM | POA: Insufficient documentation

## 2012-11-23 DIAGNOSIS — F17200 Nicotine dependence, unspecified, uncomplicated: Secondary | ICD-10-CM | POA: Insufficient documentation

## 2012-11-23 MED ORDER — PENICILLIN V POTASSIUM 500 MG PO TABS: 500.0000 mg | ORAL_TABLET | Freq: Four times a day (QID) | ORAL | Status: AC

## 2012-11-23 MED ORDER — AMPICILLIN 500 MG PO CAPS: 500.0000 mg | ORAL_CAPSULE | Freq: Four times a day (QID) | ORAL | Status: DC

## 2012-11-23 NOTE — Telephone Encounter (Signed)
Called pt and informed her of +UTI and need for Rx which has been sent to her pharmacy. She voiced understanding and agreed to pick up medication today.  Pt then asked if she could get additional pain medication. She stated that she is trying to wean herself off of the narcotics. Dr. Jolayne Panther had given her 5 pills on 3/24 and she stated that is not enough for the weaning regimen she is following. I stated that I would inquire about her request and call her back. [id number]  I reviewed chart notes from Dr. Jolayne Panther stating that she had told pt on 3/24 that she would not be given additional pain meds from this office without documentation from her podiatrist of her medical condition requiring such medicine. Also a note from Dr. Jolayne Panther on 3/27 states that pt requested Rx for narcotics during a visit w/new podiatrist which she was not given. She was offered foot cast to alleviate pain and pressure and refused. I spoke w/pt and informed her that as per her visit w/ Dr. Jolayne Panther on 3/24, she will not receive narcotic medication from this office without records from another provider which indicate the need for this treatment.  Pt stated that she will come to the clinic to sign release of information form.

## 2012-11-23 NOTE — Addendum Note (Signed)
Addended by: Catalina Antigua on: 11/23/2012 06:59 AM   Modules accepted: Orders

## 2012-11-23 NOTE — ED Provider Notes (Signed)
History     CSN: [id number]  Arrival date & time 11/23/12  0917   First MD Initiated Contact with Patient 11/23/12 1211      Chief Complaint  Patient presents with  . Dental Pain    (Consider location/radiation/quality/duration/timing/severity/associated sxs/prior treatment) Patient is a 32 y.o. female presenting with tooth pain. The history is provided by the patient.  Dental PainThe primary symptoms include mouth pain. The symptoms began 5 to 7 days ago. The symptoms are worsening. The symptoms occur constantly.  Additional symptoms include: facial swelling.    Past Medical History  Diagnosis Date  . MRSA (methicillin resistant staph aureus) culture positive   . Anxiety     currently no meds  . Pregnancy induced hypertension     first preg  . Gestational diabetes     first preg  . Drug abuse, opioid type     released from [facility] for falsifying prescriptions  . Pregnant   . Kidney stone   . Kidney stone   . Mental disorder 04/2011    states was advised for detox but couldnt due to 1st pregnancy    Past Surgical History  Procedure Laterality Date  . Wisdom tooth extraction  1995    Family History  Problem Relation Age of Onset  . Hypertension Mother     History  Substance Use Topics  . Smoking status: Current Every Day Smoker -- 0.50 packs/day for 12 years    Types: Cigarettes  . Smokeless tobacco: Never Used  . Alcohol Use: No    OB History   Grav Para Term Preterm Abortions TAB SAB Ect Mult Living   3 2 2  0 0 0 0 0 0 2      Review of Systems  HENT: Positive for facial swelling, mouth sores and dental problem.   All other systems reviewed and are negative.    Allergies  Citalopram hydrobromide; Propoxyphene-acetaminophen; and Tramadol  Home Medications  No current outpatient prescriptions on file.  BP 146/79  Pulse 78  Temp(Src) 97.6 F (36.4 C) (Oral)  Ht 5\' 8"  (1.727 m)  Wt 190 lb (86.183 kg)  BMI 28.9 kg/m2  SpO2 100%  LMP  04/22/2012  Physical Exam  Nursing note and vitals reviewed. Constitutional: She appears well-developed and well-nourished.  HENT:  Head: Normocephalic and atraumatic.  Broken tooth  Neck: Normal range of motion.  Cardiovascular: Normal rate.   Pulmonary/Chest: Effort normal.  Abdominal: Soft.  Musculoskeletal: Normal range of motion.  Skin: Skin is warm.    ED Course  Procedures (including critical care time)  Labs Reviewed - No data to display No results found.   No diagnosis found.    MDM  Pt counseled on narcotics.   Pt has had multiple rx from multi providers and hx of falsifying rx's.   Pt is pregnant.   OB has advised they will not manage narcotics.   I discussed addiction with pt.   She declined information about treatment programs. Pt advised we will be glad to help her get treatment.   Pt given rx for pcn and advised to see her dentist.       Elson Areas, PA-C 11/23/12 1237

## 2012-11-23 NOTE — ED Notes (Signed)
Dental pain x 2 weeks.  She was seen in the ED and received a dental block and pain medication but not any better.

## 2012-11-23 NOTE — Telephone Encounter (Signed)
Called pt and left message to return call to the clinics.  

## 2012-11-23 NOTE — Telephone Encounter (Signed)
Message copied by Jill Side on Fri Nov 23, 2012  8:57 AM ------      Message from: CONSTANT, Gigi Gin      Created: Fri Nov 23, 2012  6:59 AM       Please inform patient of positive UTI and Rx e-prescribed            Peggy ------

## 2012-11-23 NOTE — Telephone Encounter (Signed)
Message copied by Faythe Casa on Fri Nov 23, 2012 12:17 PM ------      Message from: Rebecca [name]      Created: Fri Nov 23, 2012  6:59 AM       Please inform patient of positive UTI and Rx e-prescribed            Peggy ------

## 2012-11-23 NOTE — Telephone Encounter (Signed)
Additional call back from pt not needed- she was already informed of UTI and treatment ordered earlier today.

## 2012-11-25 NOTE — ED Provider Notes (Signed)
History/physical exam/procedure(s) were performed by non-physician practitioner and as supervising physician I was immediately available for consultation/collaboration. I have reviewed all notes and am in agreement with care and plan.   Amenah Tucci S Torri Langston, MD 11/25/12 1005 

## 2012-11-26 ENCOUNTER — Ambulatory Visit (HOSPITAL_COMMUNITY)
Admission: RE | Admit: 2012-11-26 | Discharge: 2012-11-26 | Disposition: A | Discharge status: Home / Self Care | Payer: Self-pay | Source: Ambulatory Visit | Attending: Obstetrics and Gynecology | Admitting: Obstetrics and Gynecology

## 2012-11-26 ENCOUNTER — Encounter: Payer: Self-pay | Admitting: Obstetrics and Gynecology

## 2012-11-26 DIAGNOSIS — Z3689 Encounter for other specified antenatal screening: Secondary | ICD-10-CM | POA: Insufficient documentation

## 2012-11-26 DIAGNOSIS — O09299 Supervision of pregnancy with other poor reproductive or obstetric history, unspecified trimester: Secondary | ICD-10-CM | POA: Insufficient documentation

## 2012-11-26 DIAGNOSIS — O0930 Supervision of pregnancy with insufficient antenatal care, unspecified trimester: Secondary | ICD-10-CM | POA: Insufficient documentation

## 2012-11-26 DIAGNOSIS — O0992 Supervision of high risk pregnancy, unspecified, second trimester: Secondary | ICD-10-CM

## 2012-12-10 ENCOUNTER — Encounter: Payer: Self-pay | Admitting: Family Medicine

## 2012-12-24 ENCOUNTER — Encounter: Payer: Self-pay | Admitting: Obstetrics & Gynecology

## 2013-01-07 ENCOUNTER — Other Ambulatory Visit: Payer: Self-pay | Admitting: Family

## 2013-01-07 ENCOUNTER — Ambulatory Visit (INDEPENDENT_AMBULATORY_CARE_PROVIDER_SITE_OTHER): Payer: Self-pay | Admitting: Family

## 2013-01-07 VITALS — BP 135/80 | Temp 99.1°F | Wt 194.4 lb

## 2013-01-07 DIAGNOSIS — Z3480 Encounter for supervision of other normal pregnancy, unspecified trimester: Secondary | ICD-10-CM

## 2013-01-07 DIAGNOSIS — O0992 Supervision of high risk pregnancy, unspecified, second trimester: Secondary | ICD-10-CM

## 2013-01-07 DIAGNOSIS — F1920 Other psychoactive substance dependence, uncomplicated: Secondary | ICD-10-CM

## 2013-01-07 LAB — POCT URINALYSIS DIP (DEVICE)
Bilirubin Urine: NEGATIVE
Bilirubin Urine: NEGATIVE
Glucose, UA: NEGATIVE mg/dL
Hgb urine dipstick: NEGATIVE
Ketones, ur: NEGATIVE mg/dL
Nitrite: NEGATIVE
Nitrite: NEGATIVE
Protein, ur: NEGATIVE mg/dL
Specific Gravity, Urine: 1.02 (ref 1.005–1.030)
Urobilinogen, UA: 1 mg/dL (ref 0.0–1.0)
pH: 7 (ref 5.0–8.0)
pH: 7 (ref 5.0–8.0)

## 2013-01-07 NOTE — Progress Notes (Signed)
Pulse- 75  Pain/pressure- lower abd

## 2013-01-09 NOTE — Progress Notes (Signed)
Pt left without being seen by provider.

## 2013-01-18 ENCOUNTER — Emergency Department (HOSPITAL_COMMUNITY)
Admission: EM | Admit: 2013-01-18 | Discharge: 2013-01-18 | Disposition: A | Discharge status: Home / Self Care | Payer: Medicaid Other | Attending: Emergency Medicine | Admitting: Emergency Medicine

## 2013-01-18 ENCOUNTER — Encounter (HOSPITAL_COMMUNITY): Payer: Self-pay

## 2013-01-18 DIAGNOSIS — R350 Frequency of micturition: Secondary | ICD-10-CM | POA: Insufficient documentation

## 2013-01-18 DIAGNOSIS — Z8659 Personal history of other mental and behavioral disorders: Secondary | ICD-10-CM | POA: Insufficient documentation

## 2013-01-18 DIAGNOSIS — Z8632 Personal history of gestational diabetes: Secondary | ICD-10-CM | POA: Insufficient documentation

## 2013-01-18 DIAGNOSIS — O99330 Smoking (tobacco) complicating pregnancy, unspecified trimester: Secondary | ICD-10-CM | POA: Insufficient documentation

## 2013-01-18 DIAGNOSIS — F411 Generalized anxiety disorder: Secondary | ICD-10-CM | POA: Insufficient documentation

## 2013-01-18 DIAGNOSIS — R51 Headache: Secondary | ICD-10-CM | POA: Insufficient documentation

## 2013-01-18 DIAGNOSIS — Z87442 Personal history of urinary calculi: Secondary | ICD-10-CM | POA: Insufficient documentation

## 2013-01-18 DIAGNOSIS — R3915 Urgency of urination: Secondary | ICD-10-CM | POA: Insufficient documentation

## 2013-01-18 DIAGNOSIS — R450 Nervousness: Secondary | ICD-10-CM | POA: Insufficient documentation

## 2013-01-18 DIAGNOSIS — M549 Dorsalgia, unspecified: Secondary | ICD-10-CM | POA: Insufficient documentation

## 2013-01-18 DIAGNOSIS — Z8614 Personal history of Methicillin resistant Staphylococcus aureus infection: Secondary | ICD-10-CM | POA: Insufficient documentation

## 2013-01-18 DIAGNOSIS — Z8679 Personal history of other diseases of the circulatory system: Secondary | ICD-10-CM | POA: Insufficient documentation

## 2013-01-18 DIAGNOSIS — O9989 Other specified diseases and conditions complicating pregnancy, childbirth and the puerperium: Secondary | ICD-10-CM | POA: Insufficient documentation

## 2013-01-18 DIAGNOSIS — R109 Unspecified abdominal pain: Secondary | ICD-10-CM | POA: Insufficient documentation

## 2013-01-18 DIAGNOSIS — Z3490 Encounter for supervision of normal pregnancy, unspecified, unspecified trimester: Secondary | ICD-10-CM

## 2013-01-18 DIAGNOSIS — O219 Vomiting of pregnancy, unspecified: Secondary | ICD-10-CM | POA: Insufficient documentation

## 2013-01-18 LAB — RAPID URINE DRUG SCREEN, HOSP PERFORMED
Barbiturates: NOT DETECTED
Benzodiazepines: NOT DETECTED
Cocaine: NOT DETECTED
Tetrahydrocannabinol: NOT DETECTED

## 2013-01-18 LAB — URINALYSIS, ROUTINE W REFLEX MICROSCOPIC
Bilirubin Urine: NEGATIVE
Hgb urine dipstick: NEGATIVE
Specific Gravity, Urine: 1.02 (ref 1.005–1.030)
pH: 6.5 (ref 5.0–8.0)

## 2013-01-18 NOTE — ED Notes (Signed)
Toco monitor applied and charge nurse at [facility] contacted.

## 2013-01-18 NOTE — Progress Notes (Signed)
Called Ed to ask to adjust FHR cardio to trace

## 2013-01-18 NOTE — ED Notes (Signed)
Rebecca [name], from rapid response called and said pt has not had any contractions and the baby looks good.

## 2013-01-18 NOTE — Progress Notes (Signed)
ED RN notified of reactive FHR tracing and no contractions noted on EFM.
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
## 2013-02-04 ENCOUNTER — Encounter: Payer: Self-pay | Admitting: Obstetrics & Gynecology

## 2013-02-06 ENCOUNTER — Emergency Department (HOSPITAL_COMMUNITY): Payer: Medicaid Other

## 2013-02-06 ENCOUNTER — Emergency Department (HOSPITAL_COMMUNITY)
Admission: EM | Admit: 2013-02-06 | Discharge: 2013-02-06 | Discharge status: Against Medical Advice | Payer: Medicaid Other | Attending: Emergency Medicine | Admitting: Emergency Medicine

## 2013-02-06 ENCOUNTER — Encounter (HOSPITAL_COMMUNITY): Payer: Self-pay | Admitting: Emergency Medicine

## 2013-02-06 DIAGNOSIS — Z87442 Personal history of urinary calculi: Secondary | ICD-10-CM | POA: Insufficient documentation

## 2013-02-06 DIAGNOSIS — O9989 Other specified diseases and conditions complicating pregnancy, childbirth and the puerperium: Secondary | ICD-10-CM | POA: Insufficient documentation

## 2013-02-06 DIAGNOSIS — Z8659 Personal history of other mental and behavioral disorders: Secondary | ICD-10-CM | POA: Insufficient documentation

## 2013-02-06 DIAGNOSIS — Z8632 Personal history of gestational diabetes: Secondary | ICD-10-CM | POA: Insufficient documentation

## 2013-02-06 DIAGNOSIS — O99330 Smoking (tobacco) complicating pregnancy, unspecified trimester: Secondary | ICD-10-CM | POA: Insufficient documentation

## 2013-02-06 DIAGNOSIS — Y92410 Unspecified street and highway as the place of occurrence of the external cause: Secondary | ICD-10-CM | POA: Insufficient documentation

## 2013-02-06 DIAGNOSIS — S139XXA Sprain of joints and ligaments of unspecified parts of neck, initial encounter: Secondary | ICD-10-CM | POA: Insufficient documentation

## 2013-02-06 DIAGNOSIS — Z3490 Encounter for supervision of normal pregnancy, unspecified, unspecified trimester: Secondary | ICD-10-CM

## 2013-02-06 DIAGNOSIS — Y9389 Activity, other specified: Secondary | ICD-10-CM | POA: Insufficient documentation

## 2013-02-06 DIAGNOSIS — Z8614 Personal history of Methicillin resistant Staphylococcus aureus infection: Secondary | ICD-10-CM | POA: Insufficient documentation

## 2013-02-06 DIAGNOSIS — S161XXA Strain of muscle, fascia and tendon at neck level, initial encounter: Secondary | ICD-10-CM

## 2013-02-06 MED ORDER — MORPHINE SULFATE 2 MG/ML IJ SOLN
2.0000 mg | Freq: Once | INTRAMUSCULAR | Status: AC
  Administered 2013-02-06: 2 mg via INTRAVENOUS
  Filled 2013-02-06: qty 1

## 2013-02-06 MED ORDER — ONDANSETRON HCL 4 MG/2ML IJ SOLN
4.0000 mg | Freq: Once | INTRAMUSCULAR | Status: AC
  Administered 2013-02-06: 4 mg via INTRAVENOUS
  Filled 2013-02-06: qty 2

## 2013-02-06 MED ORDER — ACETAMINOPHEN 325 MG PO TABS
650.0000 mg | ORAL_TABLET | Freq: Once | ORAL | Status: AC
  Administered 2013-02-06: 650 mg via ORAL
  Filled 2013-02-06: qty 2
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
## 2013-02-06 NOTE — Progress Notes (Signed)
Pt has no vaginal discharge, bleeding or leaking of fluid on SVE. Abd soft and nontender. No contractions felt by patient verbalized. No contractions noted on EFM.

## 2013-02-06 NOTE — ED Notes (Signed)
Pt wanted narcotic and was told no because of pregnancy so pt decide she wanted to leave. After multiple attempts to explain to her why she could pt still decided to leave against medical advice.

## 2013-02-06 NOTE — Progress Notes (Addendum)
Pt asking for pain medication. Instructed pt that she would not be getting narcotics due to the pregnancy. Pt states she is leaving AMA. Pt verbalizes with anger that she is in pain and that she needs something for the pain. Pt informed of consequences to her unborn baby. Pt getting up and getting dressed. Pt informed that she could speak with the doctor and medical staff about helping her with pain management but that narcotics were not being administered due to her pregnancy and pregnancy care. Pt is continuing to dress and take monitors off asking friend to take her home now. ER RN takes pt to discharge area.
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
## 2013-02-06 NOTE — Progress Notes (Signed)
Updated ED MD regarding OB care once pt cleared medically pt could be transferred to [facility] to the care of [facility] in [facility]U.

## 2013-02-06 NOTE — ED Notes (Signed)
I spoke with pt after her request to speak to the CN. Pt requesting discharge papers showing proof that she was she and requesting a pain rx. States she was leaving AMA because the OB nurse told her she wasn't receiving anymore narcotics and she didn't like her. I educated pt on our policy of leaving AMA and educated on s/s when to return. Pt in no distress, pt went out to smoke and came back to speak with me and said she may go back to [facility] this evening
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
## 2013-02-06 NOTE — Progress Notes (Signed)
MD and RN notified of history of drug seeking.

## 2013-02-06 NOTE — Progress Notes (Signed)
Dr. Dolan Amen called and notified of pt drug seeking history and little to no prenatal care with [facility] clinic. Informed that pt wanted to have personal vehicle transportation to [facility]. MD stated no pt needs [facility] transportation.
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
## 2013-02-06 NOTE — Progress Notes (Signed)
Noted pt moving neck and lifting neck off bed.

## 2013-03-02 ENCOUNTER — Encounter (HOSPITAL_COMMUNITY): Payer: Self-pay | Admitting: *Deleted

## 2013-03-02 ENCOUNTER — Inpatient Hospital Stay (HOSPITAL_COMMUNITY): Payer: Medicaid Other | Admitting: Anesthesiology

## 2013-03-02 ENCOUNTER — Encounter (HOSPITAL_COMMUNITY): Payer: Self-pay | Admitting: Anesthesiology

## 2013-03-02 ENCOUNTER — Inpatient Hospital Stay (HOSPITAL_COMMUNITY)
Admission: AD | Admit: 2013-03-02 | Discharge: 2013-03-05 | DRG: 774 | Disposition: A | Discharge status: Home / Self Care | Payer: Medicaid Other | Source: Ambulatory Visit | Attending: Obstetrics & Gynecology | Admitting: Obstetrics & Gynecology

## 2013-03-02 DIAGNOSIS — I10 Essential (primary) hypertension: Secondary | ICD-10-CM | POA: Diagnosis present

## 2013-03-02 DIAGNOSIS — B951 Streptococcus, group B, as the cause of diseases classified elsewhere: Secondary | ICD-10-CM

## 2013-03-02 DIAGNOSIS — O99892 Other specified diseases and conditions complicating childbirth: Secondary | ICD-10-CM | POA: Diagnosis present

## 2013-03-02 DIAGNOSIS — J069 Acute upper respiratory infection, unspecified: Secondary | ICD-10-CM | POA: Diagnosis present

## 2013-03-02 DIAGNOSIS — O4290 Premature rupture of membranes, unspecified as to length of time between rupture and onset of labor, unspecified weeks of gestation: Secondary | ICD-10-CM

## 2013-03-02 DIAGNOSIS — O0933 Supervision of pregnancy with insufficient antenatal care, third trimester: Secondary | ICD-10-CM

## 2013-03-02 DIAGNOSIS — Z302 Encounter for sterilization: Secondary | ICD-10-CM

## 2013-03-02 DIAGNOSIS — O2343 Unspecified infection of urinary tract in pregnancy, third trimester: Secondary | ICD-10-CM

## 2013-03-02 DIAGNOSIS — O163 Unspecified maternal hypertension, third trimester: Secondary | ICD-10-CM

## 2013-03-02 DIAGNOSIS — J40 Bronchitis, not specified as acute or chronic: Secondary | ICD-10-CM | POA: Diagnosis present

## 2013-03-02 DIAGNOSIS — Z22330 Carrier of Group B streptococcus: Secondary | ICD-10-CM

## 2013-03-02 DIAGNOSIS — IMO0002 Reserved for concepts with insufficient information to code with codable children: Principal | ICD-10-CM | POA: Diagnosis present

## 2013-03-02 DIAGNOSIS — O0993 Supervision of high risk pregnancy, unspecified, third trimester: Secondary | ICD-10-CM

## 2013-03-02 LAB — PROTEIN / CREATININE RATIO, URINE
Creatinine, Urine: 84.24 mg/dL
Protein Creatinine Ratio: 0.44 — ABNORMAL HIGH (ref 0.00–0.15)
Total Protein, Urine: 37 mg/dL

## 2013-03-02 LAB — TYPE AND SCREEN: Antibody Screen: NEGATIVE

## 2013-03-02 LAB — POCT FERN TEST: POCT Fern Test: POSITIVE

## 2013-03-02 LAB — RAPID URINE DRUG SCREEN, HOSP PERFORMED
Amphetamines: NOT DETECTED
Benzodiazepines: NOT DETECTED
Cocaine: NOT DETECTED
Opiates: NOT DETECTED

## 2013-03-02 LAB — COMPREHENSIVE METABOLIC PANEL
BUN: 5 mg/dL — ABNORMAL LOW (ref 6–23)
Calcium: 8.4 mg/dL (ref 8.4–10.5)
Creatinine, Ser: 0.61 mg/dL (ref 0.50–1.10)
GFR calc Af Amer: >90 mL/min (ref >90)
Glucose, Bld: 94 mg/dL (ref 70–99)
Total Protein: 5.9 g/dL — ABNORMAL LOW (ref 6.0–8.3)

## 2013-03-02 LAB — CBC
HCT: 31.6 % — ABNORMAL LOW (ref 36.0–46.0)
Hemoglobin: 10.4 g/dL — ABNORMAL LOW (ref 12.0–15.0)
MCH: 27.9 pg (ref 26.0–34.0)
MCHC: 32.9 g/dL (ref 30.0–36.0)
RDW: 13.8 % (ref 11.5–15.5)

## 2013-03-02 LAB — URINALYSIS, ROUTINE W REFLEX MICROSCOPIC
Bilirubin Urine: NEGATIVE
Ketones, ur: NEGATIVE mg/dL
Nitrite: NEGATIVE
Urobilinogen, UA: 0.2 mg/dL (ref 0.0–1.0)

## 2013-03-02 LAB — ABO/RH: ABO/RH(D): O POS

## 2013-03-02 LAB — OB RESULTS CONSOLE ABO/RH: RH Type: POSITIVE

## 2013-03-02 MED ORDER — PENICILLIN G POTASSIUM 5000000 UNITS IJ SOLR
5.0000 10*6.[IU] | Freq: Once | INTRAVENOUS | Status: AC
  Administered 2013-03-02: 5 10*6.[IU] via INTRAVENOUS
  Filled 2013-03-02: qty 5

## 2013-03-02 MED ORDER — LACTATED RINGERS IV SOLN: 500.0000 mL | Freq: Once | INTRAVENOUS | Status: DC

## 2013-03-02 MED ORDER — OXYTOCIN 40 UNITS IN LACTATED RINGERS INFUSION - SIMPLE MED: 62.5000 mL/h | INTRAVENOUS | Status: DC

## 2013-03-02 MED ORDER — LACTATED RINGERS IV SOLN
INTRAVENOUS | Status: DC
  Administered 2013-03-02: 15:00:00 via INTRAVENOUS
  Administered 2013-03-02: 100 mL/h via INTRAVENOUS

## 2013-03-02 MED ORDER — FENTANYL 2.5 MCG/ML BUPIVACAINE 1/10 % EPIDURAL INFUSION (WH - ANES)
INTRAMUSCULAR | Status: DC | PRN
  Administered 2013-03-02: 14 mL/h via EPIDURAL

## 2013-03-02 MED ORDER — DEXTROSE 5 % IV SOLN
2.5000 10*6.[IU] | INTRAVENOUS | Status: DC
  Administered 2013-03-02 – 2013-03-03 (×2): 2.5 10*6.[IU] via INTRAVENOUS
  Filled 2013-03-02 (×6): qty 2.5

## 2013-03-02 MED ORDER — FLEET ENEMA 7-19 GM/118ML RE ENEM: 1.0000 | ENEMA | RECTAL | Status: DC | PRN

## 2013-03-02 MED ORDER — MAGNESIUM SULFATE BOLUS VIA INFUSION
4.0000 g | Freq: Once | INTRAVENOUS | Status: AC
  Administered 2013-03-02: 4 g via INTRAVENOUS
  Filled 2013-03-02: qty 500

## 2013-03-02 MED ORDER — OXYTOCIN 40 UNITS IN LACTATED RINGERS INFUSION - SIMPLE MED
1.0000 m[IU]/min | INTRAVENOUS | Status: DC
  Administered 2013-03-02: 2 m[IU]/min via INTRAVENOUS
  Administered 2013-03-02: 6 m[IU]/min via INTRAVENOUS
  Administered 2013-03-02: 4 m[IU]/min via INTRAVENOUS
  Filled 2013-03-02: qty 1000

## 2013-03-02 MED ORDER — EPHEDRINE 5 MG/ML INJ
10.0000 mg | INTRAVENOUS | Status: DC | PRN
  Filled 2013-03-02: qty 2

## 2013-03-02 MED ORDER — OXYCODONE-ACETAMINOPHEN 5-325 MG PO TABS
1.0000 | ORAL_TABLET | ORAL | Status: DC | PRN
Start: 2013-03-02 — End: 2013-03-03
  Administered 2013-03-03: 1 via ORAL
  Filled 2013-03-02: qty 1

## 2013-03-02 MED ORDER — LIDOCAINE HCL (PF) 1 % IJ SOLN
INTRAMUSCULAR | Status: DC | PRN
  Administered 2013-03-02: 5 mL
  Administered 2013-03-02: 3 mL
  Administered 2013-03-02: 5 mL

## 2013-03-02 MED ORDER — LABETALOL HCL 5 MG/ML IV SOLN
20.0000 mg | INTRAVENOUS | Status: DC | PRN
  Administered 2013-03-02 (×5): 20 mg via INTRAVENOUS
  Administered 2013-03-03: 40 mg via INTRAVENOUS
  Administered 2013-03-03: 20 mg via INTRAVENOUS
  Filled 2013-03-02 (×6): qty 4
  Filled 2013-03-02: qty 8
  Filled 2013-03-02: qty 4

## 2013-03-02 MED ORDER — TERBUTALINE SULFATE 1 MG/ML IJ SOLN: 0.2500 mg | Freq: Once | INTRAMUSCULAR | Status: AC | PRN

## 2013-03-02 MED ORDER — ACETAMINOPHEN 500 MG PO TABS
1000.0000 mg | ORAL_TABLET | Freq: Four times a day (QID) | ORAL | Status: DC | PRN
  Administered 2013-03-02: 1000 mg via ORAL
  Filled 2013-03-02 (×2): qty 1

## 2013-03-02 MED ORDER — ACETAMINOPHEN 325 MG PO TABS: 650.0000 mg | ORAL_TABLET | ORAL | Status: DC | PRN

## 2013-03-02 MED ORDER — ONDANSETRON HCL 4 MG/2ML IJ SOLN: 4.0000 mg | Freq: Four times a day (QID) | INTRAMUSCULAR | Status: DC | PRN

## 2013-03-02 MED ORDER — FENTANYL 2.5 MCG/ML BUPIVACAINE 1/10 % EPIDURAL INFUSION (WH - ANES)
14.0000 mL/h | INTRAMUSCULAR | Status: DC | PRN
  Administered 2013-03-02 – 2013-03-03 (×2): 14 mL/h via EPIDURAL
  Filled 2013-03-02 (×3): qty 125

## 2013-03-02 MED ORDER — HYDROXYZINE HCL 50 MG PO TABS
50.0000 mg | ORAL_TABLET | Freq: Four times a day (QID) | ORAL | Status: DC | PRN
  Filled 2013-03-02: qty 1

## 2013-03-02 MED ORDER — IBUPROFEN 600 MG PO TABS
600.0000 mg | ORAL_TABLET | Freq: Four times a day (QID) | ORAL | Status: DC | PRN
  Administered 2013-03-03: 600 mg via ORAL
  Filled 2013-03-02: qty 1

## 2013-03-02 MED ORDER — EPHEDRINE 5 MG/ML INJ
10.0000 mg | INTRAVENOUS | Status: DC | PRN
  Filled 2013-03-02: qty 4
  Filled 2013-03-02: qty 2
  Filled 2013-03-02: qty 4

## 2013-03-02 MED ORDER — FENTANYL CITRATE 0.05 MG/ML IJ SOLN
100.0000 ug | INTRAMUSCULAR | Status: DC | PRN
  Administered 2013-03-02 – 2013-03-03 (×3): 100 ug via INTRAVENOUS
  Filled 2013-03-02 (×3): qty 2

## 2013-03-02 MED ORDER — PHENYLEPHRINE 40 MCG/ML (10ML) SYRINGE FOR IV PUSH (FOR BLOOD PRESSURE SUPPORT)
80.0000 ug | PREFILLED_SYRINGE | INTRAVENOUS | Status: DC | PRN
  Filled 2013-03-02: qty 2
  Filled 2013-03-02 (×2): qty 5

## 2013-03-02 MED ORDER — MAGNESIUM SULFATE 40 G IN LACTATED RINGERS - SIMPLE
2.0000 g/h | INTRAVENOUS | Status: DC
  Administered 2013-03-02: 2 g/h via INTRAVENOUS
  Filled 2013-03-02: qty 500

## 2013-03-02 MED ORDER — OXYTOCIN BOLUS FROM INFUSION
500.0000 mL | INTRAVENOUS | Status: DC
  Administered 2013-03-03: 500 mL via INTRAVENOUS

## 2013-03-02 MED ORDER — LIDOCAINE HCL (PF) 1 % IJ SOLN
30.0000 mL | INTRAMUSCULAR | Status: DC | PRN
  Filled 2013-03-02 (×2): qty 30

## 2013-03-02 MED ORDER — DIPHENHYDRAMINE HCL 50 MG/ML IJ SOLN: 12.5000 mg | INTRAMUSCULAR | Status: DC | PRN

## 2013-03-02 MED ORDER — LACTATED RINGERS IV SOLN
500.0000 mL | INTRAVENOUS | Status: DC | PRN
  Administered 2013-03-02: 500 mL via INTRAVENOUS

## 2013-03-02 MED ORDER — PHENYLEPHRINE 40 MCG/ML (10ML) SYRINGE FOR IV PUSH (FOR BLOOD PRESSURE SUPPORT)
80.0000 ug | PREFILLED_SYRINGE | INTRAVENOUS | Status: DC | PRN
  Filled 2013-03-02: qty 2

## 2013-03-02 MED ORDER — CITRIC ACID-SODIUM CITRATE 334-500 MG/5ML PO SOLN: 30.0000 mL | ORAL | Status: DC | PRN

## 2013-03-02 NOTE — MAU Note (Signed)
Pt reports she has been having contractions on and off all night about 5 min apart now. Pt reports a little clear mucusy discharge no bleeding. Good fetal movement reported.

## 2013-03-02 NOTE — MAU Provider Note (Signed)
Please refer to H&P Cheral Marker, CNM, WHNP-BC

## 2013-03-02 NOTE — MAU Note (Addendum)
Pt reports uc's "all night" every 15-30 minutes. Pt reports that she has not been to a prenatal visit recently due to lack of transportation.

## 2013-03-02 NOTE — Anesthesia Preprocedure Evaluation (Signed)
Anesthesia Evaluation  Patient identified by MRN, date of birth, ID band Patient awake    Reviewed: Allergy & Precautions, H&P , NPO status , Patient's Chart, lab work & pertinent test results  History of Anesthesia Complications Negative for: history of anesthetic complications  Airway Mallampati: II TM Distance: >3 FB Neck ROM: full    Dental no notable dental hx. (+) Teeth Intact   Pulmonary neg pulmonary ROS, asthma ,  breath sounds clear to auscultation  Pulmonary exam normal       Cardiovascular hypertension, negative cardio ROS  Rhythm:regular Rate:Normal     Neuro/Psych negative neurological ROS  negative psych ROS   GI/Hepatic negative GI ROS, Neg liver ROS, (+)     substance abuse (opioids)   ,   Endo/Other  negative endocrine ROSdiabetes, Gestational  Renal/GU negative Renal ROS  negative genitourinary   Musculoskeletal   Abdominal Normal abdominal exam  (+)   Peds  Hematology negative hematology ROS (+)   Anesthesia Other Findings   Reproductive/Obstetrics (+) Pregnancy                           Anesthesia Physical Anesthesia Plan  ASA: III  Anesthesia Plan: Epidural   Post-op Pain Management:    Induction:   Airway Management Planned:   Additional Equipment:   Intra-op Plan:   Post-operative Plan:   Informed Consent: I have reviewed the patients History and Physical, chart, labs and discussed the procedure including the risks, benefits and alternatives for the proposed anesthesia with the patient or authorized representative who has indicated his/her understanding and acceptance.     Plan Discussed with:   Anesthesia Plan Comments:         Anesthesia Quick Evaluation
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
## 2013-03-02 NOTE — Progress Notes (Addendum)
Rebecca [name] is a 32 y.o. G3P2002 at [redacted]w[redacted]d admitted for [facility] w/ super-imposed pre-e  Subjective: Comfortable w/ epidural, no complaints  Objective: BP 156/91  Pulse 71  Temp(Src) 97.6 F (36.4 C) (Oral)  Resp 18  Ht 5\' 8"  (1.727 m)  Wt 93.895 kg (207 lb)  BMI 31.48 kg/m2  SpO2 96%  LMP 04/22/2012 I/O last 3 completed shifts: In: 879.6 [P.O.:240; I.V.:639.6] Out: 900 [Urine:900] Total I/O In: 508.3 [P.O.:240; I.V.:168.3; IV Piggyback:100] Out: 350 [Urine:350]  Has required total of 60mg  labetalol iv, working well  FHT:  FHR: 125 bpm, variability: moderate,  accelerations:  Present,  decelerations:  Absent UC:   regular, every 2-5 minutes SVE:   Dilation: 6 Effacement (%): 70 Station: -2 Exam by:: Dr. Despina Hidden, vtx Exam by me 6/70/-2 w/ bulging forebag- arom copious amt of clear fluid, vtx small finger-like projections felt maternal Rt, also felt like feeling eyelid/eyeball Exam by Dr. Despina Hidden, vtx w/o abnormal object felt- IFSE placed per RN request d/t difficulty tracing fhr via efm  Labs: Lab Results  Component Value Date   WBC 11.3* 03/02/2013   HGB 10.4* 03/02/2013   HCT 31.6* 03/02/2013   MCV 84.7 03/02/2013   PLT 175 03/02/2013    Assessment / Plan: Induction of labor due to [facility] w/ super-imposed pre-e,  progressing well on pitocin at 18mu/min, forebag now arom'd  Labor: Progressing normally Preeclampsia:  on magnesium sulfate, no signs or symptoms of toxicity and labs stable Fetal Wellbeing:  Category I Pain Control:  Epidural I/D:  pcn for gbs pos Anticipated MOD:  NSVD  Marge Duncans 03/02/2013, 8:25 PM

## 2013-03-02 NOTE — Anesthesia Procedure Notes (Signed)
Epidural Patient location during procedure: OB  Staffing Anesthesiologist: Phillips Grout Performed by: anesthesiologist   Preanesthetic Checklist Completed: patient identified, site marked, surgical consent, pre-op evaluation, timeout performed, IV checked, risks and benefits discussed and monitors and equipment checked  Epidural Patient position: sitting Prep: ChloraPrep Patient monitoring: heart rate, continuous pulse ox and blood pressure Approach: midline Injection technique: LOR saline  Needle:  Needle type: Tuohy  Needle gauge: 17 G Needle length: 9 cm and 9 Needle insertion depth: 7 cm Catheter type: closed end flexible Catheter size: 20 Guage Catheter at skin depth: 12 cm Test dose: negative  Assessment Events: blood not aspirated, injection not painful, no injection resistance, negative IV test and no paresthesia  Additional Notes   Patient tolerated the insertion well without complications.

## 2013-03-03 ENCOUNTER — Encounter (HOSPITAL_COMMUNITY): Payer: Self-pay | Admitting: Family Medicine

## 2013-03-03 DIAGNOSIS — O4290 Premature rupture of membranes, unspecified as to length of time between rupture and onset of labor, unspecified weeks of gestation: Secondary | ICD-10-CM

## 2013-03-03 DIAGNOSIS — I10 Essential (primary) hypertension: Secondary | ICD-10-CM

## 2013-03-03 DIAGNOSIS — IMO0002 Reserved for concepts with insufficient information to code with codable children: Secondary | ICD-10-CM

## 2013-03-03 LAB — CBC
HCT: 25.4 % — ABNORMAL LOW (ref 36.0–46.0)
Hemoglobin: 8.3 g/dL — ABNORMAL LOW (ref 12.0–15.0)
MCHC: 33.2 g/dL (ref 30.0–36.0)
MCV: 87 fL (ref 78.0–100.0)
RBC: 2.92 MIL/uL — ABNORMAL LOW (ref 3.87–5.11)
RDW: 14 % (ref 11.5–15.5)
RDW: 13.9 % (ref 11.5–15.5)
WBC: 17.3 10*3/uL — ABNORMAL HIGH (ref 4.0–10.5)

## 2013-03-03 LAB — HEPATITIS B SURFACE ANTIGEN: Hepatitis B Surface Ag: NEGATIVE

## 2013-03-03 LAB — URINE CULTURE

## 2013-03-03 MED ORDER — BISACODYL 10 MG RE SUPP: 10.0000 mg | Freq: Every day | RECTAL | Status: DC | PRN

## 2013-03-03 MED ORDER — BENZOCAINE-MENTHOL 20-0.5 % EX AERO
1.0000 "application " | INHALATION_SPRAY | CUTANEOUS | Status: DC | PRN
  Administered 2013-03-03: 1 via TOPICAL
  Filled 2013-03-03: qty 56

## 2013-03-03 MED ORDER — HYDROXYZINE HCL 50 MG PO TABS
50.0000 mg | ORAL_TABLET | Freq: Four times a day (QID) | ORAL | Status: DC | PRN
  Administered 2013-03-03: 50 mg via ORAL
  Filled 2013-03-03: qty 1

## 2013-03-03 MED ORDER — PROMETHAZINE HCL 25 MG/ML IJ SOLN
12.5000 mg | Freq: Once | INTRAMUSCULAR | Status: AC
  Administered 2013-03-03: 12.5 mg via INTRAVENOUS
  Filled 2013-03-03: qty 1

## 2013-03-03 MED ORDER — LACTATED RINGERS IV SOLN
INTRAVENOUS | Status: DC
  Administered 2013-03-03 (×2): via INTRAVENOUS

## 2013-03-03 MED ORDER — SODIUM CHLORIDE 0.9 % IJ SOLN: 3.0000 mL | INTRAMUSCULAR | Status: DC | PRN

## 2013-03-03 MED ORDER — ZOLPIDEM TARTRATE 5 MG PO TABS
5.0000 mg | ORAL_TABLET | Freq: Every evening | ORAL | Status: DC | PRN
  Administered 2013-03-03: 5 mg via ORAL
  Filled 2013-03-03: qty 1

## 2013-03-03 MED ORDER — AMLODIPINE BESYLATE 5 MG PO TABS
5.0000 mg | ORAL_TABLET | Freq: Every day | ORAL | Status: DC
  Administered 2013-03-03 – 2013-03-04 (×2): 5 mg via ORAL
  Filled 2013-03-03 (×3): qty 1

## 2013-03-03 MED ORDER — TETANUS-DIPHTH-ACELL PERTUSSIS 5-2.5-18.5 LF-MCG/0.5 IM SUSP
0.5000 mL | Freq: Once | INTRAMUSCULAR | Status: AC
  Administered 2013-03-04: 0.5 mL via INTRAMUSCULAR
  Filled 2013-03-03: qty 0.5

## 2013-03-03 MED ORDER — SENNOSIDES-DOCUSATE SODIUM 8.6-50 MG PO TABS
2.0000 | ORAL_TABLET | Freq: Every day | ORAL | Status: DC
  Administered 2013-03-03 – 2013-03-04 (×2): 2 via ORAL

## 2013-03-03 MED ORDER — OXYCODONE-ACETAMINOPHEN 5-325 MG PO TABS
1.0000 | ORAL_TABLET | ORAL | Status: DC | PRN
  Administered 2013-03-03 – 2013-03-05 (×13): 2 via ORAL
  Filled 2013-03-03 (×13): qty 2

## 2013-03-03 MED ORDER — DIBUCAINE 1 % RE OINT: 1.0000 "application " | TOPICAL_OINTMENT | RECTAL | Status: DC | PRN

## 2013-03-03 MED ORDER — SIMETHICONE 80 MG PO CHEW: 80.0000 mg | CHEWABLE_TABLET | ORAL | Status: DC | PRN

## 2013-03-03 MED ORDER — ONDANSETRON HCL 4 MG PO TABS: 4.0000 mg | ORAL_TABLET | ORAL | Status: DC | PRN

## 2013-03-03 MED ORDER — MEASLES, MUMPS & RUBELLA VAC ~~LOC~~ INJ
0.5000 mL | INJECTION | Freq: Once | SUBCUTANEOUS | Status: DC
  Filled 2013-03-03: qty 0.5

## 2013-03-03 MED ORDER — ONDANSETRON HCL 4 MG/2ML IJ SOLN: 4.0000 mg | INTRAMUSCULAR | Status: DC | PRN

## 2013-03-03 MED ORDER — FLEET ENEMA 7-19 GM/118ML RE ENEM: 1.0000 | ENEMA | Freq: Every day | RECTAL | Status: DC | PRN

## 2013-03-03 MED ORDER — WITCH HAZEL-GLYCERIN EX PADS: 1.0000 "application " | MEDICATED_PAD | CUTANEOUS | Status: DC | PRN

## 2013-03-03 MED ORDER — HYDROXYZINE HCL 50 MG/ML IM SOLN
50.0000 mg | Freq: Four times a day (QID) | INTRAMUSCULAR | Status: DC | PRN
  Filled 2013-03-03: qty 1

## 2013-03-03 MED ORDER — MAGNESIUM SULFATE 40 G IN LACTATED RINGERS - SIMPLE
2.0000 g/h | INTRAVENOUS | Status: AC
  Administered 2013-03-03: 2 g/h via INTRAVENOUS
  Filled 2013-03-03 (×2): qty 500

## 2013-03-03 MED ORDER — OXYTOCIN 40 UNITS IN LACTATED RINGERS INFUSION - SIMPLE MED: 62.5000 mL/h | INTRAVENOUS | Status: DC | PRN

## 2013-03-03 MED ORDER — SODIUM CHLORIDE 0.9 % IJ SOLN
3.0000 mL | Freq: Two times a day (BID) | INTRAMUSCULAR | Status: DC
  Administered 2013-03-04 (×2): 3 mL via INTRAVENOUS

## 2013-03-03 MED ORDER — NICOTINE 21 MG/24HR TD PT24
21.0000 mg | MEDICATED_PATCH | Freq: Every day | TRANSDERMAL | Status: DC
  Administered 2013-03-03 – 2013-03-05 (×3): 21 mg via TRANSDERMAL
  Filled 2013-03-03 (×4): qty 1

## 2013-03-03 MED ORDER — LANOLIN HYDROUS EX OINT: TOPICAL_OINTMENT | CUTANEOUS | Status: DC | PRN

## 2013-03-03 MED ORDER — IBUPROFEN 600 MG PO TABS
600.0000 mg | ORAL_TABLET | Freq: Four times a day (QID) | ORAL | Status: DC
  Administered 2013-03-03 – 2013-03-05 (×10): 600 mg via ORAL
  Filled 2013-03-03 (×10): qty 1

## 2013-03-03 MED ORDER — PRENATAL MULTIVITAMIN CH
1.0000 | ORAL_TABLET | Freq: Every day | ORAL | Status: DC
  Administered 2013-03-04 – 2013-03-05 (×2): 1 via ORAL
  Filled 2013-03-03 (×2): qty 1

## 2013-03-03 MED ORDER — DIPHENHYDRAMINE HCL 25 MG PO CAPS: 25.0000 mg | ORAL_CAPSULE | Freq: Four times a day (QID) | ORAL | Status: DC | PRN

## 2013-03-03 MED ORDER — SODIUM CHLORIDE 0.9 % IV SOLN: 250.0000 mL | INTRAVENOUS | Status: DC | PRN

## 2013-03-03 NOTE — Progress Notes (Addendum)
Post Partum Day 0 Subjective: no complaints and tolerating PO  Objective: Blood pressure 135/71, pulse 66, temperature 97.8 F (36.6 C), temperature source Oral, resp. rate 18, height 5\' 8"  (1.727 m), weight 190 lb 4.8 oz (86.32 kg), last menstrual period 04/22/2012, SpO2 99.00%, unknown if currently breastfeeding. Filed Vitals:   03/03/13 0250 03/03/13 0400 03/03/13 0500 03/03/13 0600  BP: 140/86 149/88 145/63 135/71  Pulse: 68 77 61 66  Temp:  97.8 F (36.6 C)    TempSrc:  Oral    Resp: 16 18    Height:  5\' 8"  (1.727 m)    Weight:  190 lb 4.8 oz (86.32 kg)    SpO2:  100% 99% 99%    Physical Exam:  General: alert, cooperative and no distress Lochia: appropriate Uterine Fundus: firm  DVT Evaluation: No evidence of DVT seen on physical exam.   Recent Labs  03/02/13 1314 03/03/13 0216  HGB 10.4* 9.8*  HCT 31.6* 29.5*    Assessment/Plan: Cont MGSO4 24 hours Cont BP control with Norvasc 5 daily  Bottle Depo/Nexplanon  LOS: 1 day   EURE,LUTHER H 03/03/2013, 7:03 AM

## 2013-03-03 NOTE — Progress Notes (Signed)
OOB to BR to void and do peri-care. Gait steady, no dizziness, pt. Very independent. Moderate amt. Of lochia noted with void. When pt. Back to bed, fundal check was firm, but massaged fundus anyway

## 2013-03-04 MED ORDER — LABETALOL HCL 5 MG/ML IV SOLN
10.0000 mg | INTRAVENOUS | Status: DC | PRN
  Filled 2013-03-04: qty 4

## 2013-03-04 MED ORDER — AZITHROMYCIN 250 MG PO TABS
250.0000 mg | ORAL_TABLET | Freq: Every day | ORAL | Status: DC
  Administered 2013-03-05: 250 mg via ORAL
  Filled 2013-03-04 (×2): qty 1

## 2013-03-04 MED ORDER — LORAZEPAM 1 MG PO TABS
0.5000 mg | ORAL_TABLET | Freq: Once | ORAL | Status: AC | PRN
  Filled 2013-03-04: qty 1

## 2013-03-04 MED ORDER — HYDROCHLOROTHIAZIDE 25 MG PO TABS
25.0000 mg | ORAL_TABLET | Freq: Every day | ORAL | Status: DC
  Administered 2013-03-04 – 2013-03-05 (×2): 25 mg via ORAL
  Filled 2013-03-04 (×3): qty 1

## 2013-03-04 MED ORDER — AZITHROMYCIN 500 MG PO TABS
500.0000 mg | ORAL_TABLET | Freq: Every day | ORAL | Status: AC
  Administered 2013-03-04: 500 mg via ORAL
  Filled 2013-03-04: qty 1

## 2013-03-04 MED ORDER — ALPRAZOLAM 0.5 MG PO TABS
0.5000 mg | ORAL_TABLET | Freq: Once | ORAL | Status: AC
  Administered 2013-03-04: 0.5 mg via ORAL
  Filled 2013-03-04: qty 1

## 2013-03-04 NOTE — Progress Notes (Addendum)
Post Partum Day 1 Subjective: up ad lib, voiding, tolerating PO and + flatus Magnesium Sulfate off. Ready for transfer to floor Wants to bottlefeed, but considering pumping to "make my uterus go down faster" Has had URI for 2 weeks, nonproductive cough Objective: Blood pressure 155/91, pulse 77, temperature 98.1 F (36.7 C), temperature source Oral, resp. rate 18, height 5\' 8"  (1.727 m), weight 86.32 kg (190 lb 4.8 oz), last menstrual period 04/22/2012, SpO2 100.00%, unknown if currently breastfeeding. Filed Vitals:   03/04/13 0200 03/04/13 0400 03/04/13 0407 03/04/13 0800  BP: 139/85 127/65  155/91  Pulse:  76 73 77  Temp:   98.6 F (37 C) 98.1 F (36.7 C)  TempSrc:   Oral Oral  Resp:  16  18  Height:      Weight:      SpO2:  96% 98% 100%    Physical Exam:   General: alert, cooperative and no distress Resp:  Lungs clear but there are low-pitched expiratory wheezes upper lobes  Lochia: appropriate Uterine Fundus: firm Incision: healing well DVT Evaluation: No evidence of DVT seen on physical exam. No edema DTRs normal with no clonus  I/O last 3 completed shifts: In: 7960.7 [P.O.:3324; I.V.:4436.7; IV Piggyback:200] Out: 16109 [Urine:9050; Blood:1000] Total I/O In: 100 [P.O.:100] Out: 1000 [Urine:1000]     Recent Labs  03/03/13 0216 03/03/13 0700  HGB 9.8* 8.3*  HCT 29.5* 25.4*    Assessment/Plan: A:  Postpartum Day #1      Preeclampsia      URI, bronchitis  P:  Plan for discharge tomorrow if stable      Transfer to Mother Baby Unit.       Will Start Z-Pack for bronchitis   LOS: 2 days   Shannon [facility] 03/04/2013, 9:35 AM

## 2013-03-04 NOTE — Progress Notes (Signed)
CSW attempted to meet with MOB to complete assessment but she had visitors at this time and requested that CSW return at a later time.

## 2013-03-04 NOTE — Progress Notes (Signed)
Interim Progress Note  Called by RN for pt stating she is anxious and wanting xanax, as well as for BP PRN order.  1. Anxiety - On pt interview, she states she wants help with sleep tonight and got xanax last night which helped. She states anxiety is not the main issue, but rather left back/leg pain after epidural and "restless leg" symptoms. She states for anxiety she took xanax 0.5mg  qhs prn at home after dx of Generalized Anxiety D/o in 2012 by [facility]. She states she was not on any other psychiatric medications or medication for her anxiety or for depression. She denies current SI/HI or depressive symptoms. She denies seeing psychiatrist during pregnancy. - Encouraged getting out of bed and utilizing heating pad for leg pain/restlessness. - Pt may need workup as outpatient if restless leg symptoms continue, but they may be more likely due to s/p epidural and SVD. - Pt already has ambien qhs ordered. Ordered ativan 0.5mg  x 1 prn tonight. Pt told nursing she may not even need it. - For pain, pt already has percocet ordered. - Stated that xanax or ativan are not long-term treatments and that pt should resume follow-up with her psychiatrist.  2. Blood pressure - elevated today to 150-160s systolic. Could be contributing to restlessness. Pt has norvasc 5mg  daily ordered and received this today. - Ordered HCTZ 25mg  daily starting now and labetalol IV PRN with parameters and instruction to call MD if needed.  Pt and nurse communicated understanding of plan.  Leona Singleton, MD 03/04/2013 9:31 PM

## 2013-03-04 NOTE — Anesthesia Postprocedure Evaluation (Signed)
  Anesthesia Post-op Note  Patient: Rebecca [name]  Procedure(s) Performed: * No procedures listed *  Patient Location: A-ICU  Anesthesia Type:Epidural  Level of Consciousness: awake  Airway and Oxygen Therapy: Patient Spontanous Breathing  Post-op Pain: none  Post-op Assessment: Patient's Cardiovascular Status Stable, Respiratory Function Stable, Patent Airway, No signs of Nausea or vomiting, Adequate PO intake, Pain level controlled, No headache, No backache, No residual numbness and No residual motor weakness  Post-op Vital Signs: Reviewed and stable  Complications: No apparent anesthesia complications

## 2013-03-04 NOTE — Progress Notes (Signed)
UR chart review completed.  

## 2013-03-05 LAB — CBC
HCT: 24.7 % — ABNORMAL LOW (ref 36.0–46.0)
MCHC: 32 g/dL (ref 30.0–36.0)
Platelets: 207 10*3/uL (ref 150–400)
RDW: 14.1 % (ref 11.5–15.5)
WBC: 12.9 10*3/uL — ABNORMAL HIGH (ref 4.0–10.5)

## 2013-03-05 LAB — COMPREHENSIVE METABOLIC PANEL
ALT: 6 U/L (ref 0–35)
Alkaline Phosphatase: 136 U/L — ABNORMAL HIGH (ref 39–117)
BUN: 7 mg/dL (ref 6–23)
CO2: 25 mEq/L (ref 19–32)
Chloride: 99 mEq/L (ref 96–112)
GFR calc Af Amer: >90 mL/min (ref >90)
GFR calc non Af Amer: >90 mL/min (ref >90)
Glucose, Bld: 72 mg/dL (ref 70–99)
Potassium: 3.8 mEq/L (ref 3.5–5.1)
Sodium: 136 mEq/L (ref 135–145)
Total Bilirubin: 0.3 mg/dL (ref 0.3–1.2)

## 2013-03-05 LAB — PROTEIN / CREATININE RATIO, URINE
Protein Creatinine Ratio: 0.87 — ABNORMAL HIGH (ref 0.00–0.15)
Total Protein, Urine: 22.7 mg/dL

## 2013-03-05 MED ORDER — OXYCODONE-ACETAMINOPHEN 5-325 MG PO TABS: 1.0000 | ORAL_TABLET | ORAL | Status: DC | PRN

## 2013-03-05 MED ORDER — AZITHROMYCIN 250 MG PO TABS: 250.0000 mg | ORAL_TABLET | Freq: Every day | ORAL | Status: DC

## 2013-03-05 MED ORDER — HYDROCHLOROTHIAZIDE 25 MG PO TABS: 25.0000 mg | ORAL_TABLET | Freq: Every day | ORAL | Status: DC

## 2013-03-05 MED ORDER — LORAZEPAM 1 MG PO TABS
0.5000 mg | ORAL_TABLET | Freq: Once | ORAL | Status: AC | PRN
  Administered 2013-03-05: 0.5 mg via ORAL

## 2013-03-05 MED ORDER — AMLODIPINE BESYLATE 10 MG PO TABS: 10.0000 mg | ORAL_TABLET | Freq: Every day | ORAL | Status: AC

## 2013-03-05 MED ORDER — PNEUMOCOCCAL VAC POLYVALENT 25 MCG/0.5ML IJ INJ
0.5000 mL | INJECTION | INTRAMUSCULAR | Status: DC
  Filled 2013-03-05: qty 0.5

## 2013-03-05 MED ORDER — IBUPROFEN 600 MG PO TABS: 600.0000 mg | ORAL_TABLET | Freq: Four times a day (QID) | ORAL | Status: AC

## 2013-03-05 MED ORDER — AMLODIPINE BESYLATE 10 MG PO TABS
10.0000 mg | ORAL_TABLET | Freq: Every day | ORAL | Status: DC
  Administered 2013-03-05: 10 mg via ORAL
  Filled 2013-03-05 (×2): qty 1

## 2013-03-05 NOTE — Discharge Summary (Signed)
Obstetric Discharge Summary Reason for Admission: onset of labor Prenatal Procedures: Preeclampsia Intrapartum Procedures: spontaneous vaginal delivery and preeclampsia Postpartum Procedures: none Complications-Operative and Postpartum: none Hemoglobin  Date Value Range Status  03/05/2013 7.9* 12.0 - 15.0 g/dL Final     HCT  Date Value Range Status  03/05/2013 24.7* 36.0 - 46.0 % Final    Physical Exam:  General: alert and no distress Heart: Regular rate and rhythm, normal s1/s2 Lungs: mild diffuse wheezes throughout Lochia: appropriate Abdomen: mild tenderness to palpation around uterus and LLQ Uterine Fundus: firm Incision: none DVT Evaluation: No significant calf/ankle edema. Pulses: 2+ posterior tibial and DP bilaterally  Results for orders placed during the hospital encounter of 03/02/13 (from the past 24 hour(s))  COMPREHENSIVE METABOLIC PANEL     Status: Abnormal   Collection Time    03/05/13  9:25 AM      Result Value Range   Sodium 136  135 - 145 mEq/L   Potassium 3.8  3.5 - 5.1 mEq/L   Chloride 99  96 - 112 mEq/L   CO2 25  19 - 32 mEq/L   Glucose, Bld 72  70 - 99 mg/dL   BUN 7  6 - 23 mg/dL   Creatinine, Ser 1.61  0.50 - 1.10 mg/dL   Calcium 8.8  8.4 - 09.6 mg/dL   Total Protein 5.9 (*) 6.0 - 8.3 g/dL   Albumin 2.5 (*) 3.5 - 5.2 g/dL   AST 13  0 - 37 U/L   ALT 6  0 - 35 U/L   Alkaline Phosphatase 136 (*) 39 - 117 U/L   Total Bilirubin 0.3  0.3 - 1.2 mg/dL   GFR calc non Af Amer >90  >90 mL/min   GFR calc Af Amer >90  >90 mL/min  CBC     Status: Abnormal   Collection Time    03/05/13  9:25 AM      Result Value Range   WBC 12.9 (*) 4.0 - 10.5 K/uL   RBC 2.88 (*) 3.87 - 5.11 MIL/uL   Hemoglobin 7.9 (*) 12.0 - 15.0 g/dL   HCT 04.5 (*) 40.9 - 81.1 %   MCV 85.8  78.0 - 100.0 fL   MCH 27.4  26.0 - 34.0 pg   MCHC 32.0  30.0 - 36.0 g/dL   RDW 91.4  78.2 - 95.6 %   Platelets 207  150 - 400 K/uL  PROTEIN / CREATININE RATIO, URINE     Status: Abnormal   Collection Time    03/05/13  9:45 AM      Result Value Range   Creatinine, Urine 26.10     Total Protein, Urine 22.7     PROTEIN CREATININE RATIO 0.87 (*) 0.00 - 0.15     Discharge Diagnoses: Term Pregnancy-delivered and Preelampsia  Discharge Information: Date: 03/05/2013 Activity: pelvic rest Diet: routine Medications: Ibuprofen and Percocet Condition: stable Instructions: refer to practice specific booklet Discharge to: home, staying with baby  # Preeclampsia: - Mag discontinued and transferred to floor without issue - Labs on discharge: ALT/AST normal, Ur Pr:Cr ratio still elevated at 0.87 but patient is asymptomatic.  # 2 week follow up with [facility]  Newborn Data: Live born female  Birth Weight: 7 lb 14.8 oz (3595 g) APGAR: 9, 9  Baby staying until Thursday or Friday per pediatrician.  Tawni Carnes 03/05/2013, 3:16 PM Evaluation and management procedures were performed by Resident physician under my supervision/collaboration. Chart reviewed, patient examined by me and I agree  with management and plan.

## 2013-03-05 NOTE — Clinical Social Work Maternal (Signed)
Clinical Social Work Department  PSYCHOSOCIAL ASSESSMENT - MATERNAL/CHILD  03/05/2013  Patient: [name],[name] Account Number: [id number] Admit Date: 03/02/2013  Childs Name:  Rebecca [name]   Clinical Social Worker: Oumou Smead, LCSW Date/Time: 03/05/2013 11:24 AM  Date Referred: 03/05/2013  Referred reason   LPNC   Substance Abuse   Depression/Anxiety   Other referral source:  I: FAMILY / HOME ENVIRONMENT  Child's legal guardian: PARENT  Guardian - Name  Guardian - Age  Guardian - Address   Rebecca [name]  32  [address] [place], [place] [address]   Rebecca [name]  27  (same as above)   Other household support members/support persons  Other support:  Pt's mother   FOB's mother   II PSYCHOSOCIAL DATA  Information Source: Patient Interview  Financial and Community Resources  Employment:  Financial resources: Self Pay  If Medicaid - County:  School / Grade:  Maternity Care Coordinator / Child Services Coordination / Early Interventions: Cultural issues impacting care:  III STRENGTHS  Strengths   Adequate Resources   Home prepared for Child (including basic supplies)   Supportive family/friends   Strength comment:  IV RISK FACTORS AND CURRENT PROBLEMS  Current Problem: YES  Risk Factor & Current Problem  Patient Issue  Family Issue  Risk Factor / Current Problem Comment   Mental Illness  Y  N  Hx of depression/anxiety   Substance Abuse  Y  N  Hx of opiates   Other - See comment  Y  N  Limited PNC   V SOCIAL WORK ASSESSMENT  CSW met with pt to assess her current social situation regarding substance abuse history, reason for limited PNC & depression/anxiety disorder. Pt currently lives with her fiance, Rebecca [name]. Pt has 2 children who do not live with her. According to the pt, her oldest son, Rebecca [name] (DOB 07/26/00), lives with his father & her other son, Rebecca [name] (DOB 12/30/11), lives with her mother. Initially, pt denied any CPS involvement, as she told  CSW that she made a voluntary plan with family members. As per chart review, [place] CPS was involved & removed Rebecca from her care. CSW asked pt about previous CPS involvement, she became emotional & tearful. She told CSW that [place] CPS workers did not give her a chance to raise her son & placed with her mother, Rebecca [name] (# [phone]). Pt & FOB were required to successfully complete parenting & substance abuse classes in an effort to regain custody. Due to their lack of consistent transportation, they were not able to fully comply. Pt told CSW that her situation has changed now & asked for an opportunity to parent this child. Since pt's child has not be returned to her custody this CSW reported case to [place], requesting an investigation. Pt understands the reason for the report however concerned about the possibility of this child being removed from her care. She is confident that she is in a better position to parent & told CSW that she still provides for her youngest son. FOB supports the family financially. Pt started using opiates in 2011, after her Neurologist prescribed the medication. She admits to regular to since then but stopped during 3rd trimester(April) of pregnancy. Most recently, she was prescribed Percocet 5mg, 3 x's a day by a Podiatrist. She denies other prescription substance use or illegal substance use. UDS is negative, meconium results are pending. Pt is aware that the infant may experienced withdrawal symptoms, as   her youngest son & may require high level of care. She denies any depression symptoms recently however admits to some mild anxiety symptoms. She reports being able to cope well & is not interested in medication or therapy at this time. Pt did not received PNC due to lack of transportation. She does not have Medicaid, WIC or food stamps. She has all the necessary supplies for the infant & appears to be bonding well. CSW reported concerns to CPS & will  continue to monitor drug screen results. CSW available to assist further as needed until discharge.   VI SOCIAL WORK PLAN  Social Work Plan   No Further Intervention Required / No Barriers to Discharge   Type of pt/family education:  If child protective services report - county: [place]  If child protective services report - date: 03/05/2013  Information/referral to community resources comment:  CC4C   Other social work plan:     

## 2013-03-05 NOTE — Progress Notes (Signed)
CPS worker, [employer] came to meet with pt & FOB.  CSW will update staff on discharge plans for infant once advised.

## 2013-03-05 NOTE — Progress Notes (Signed)
Post Partum Day 2 Subjective: up ad lib, voiding, tolerating PO and had bowel movement yesterday. Complains of headache and floaters for a couple of days and epigastric pain.  Overnight, pt was anxious, complained of restless legs, and requested ativan.   Objective: Blood pressure 142/90, pulse 66, temperature 97.3 F (36.3 C), temperature source Oral, resp. rate 18, height 5\' 8"  (1.727 m), weight 86.32 kg (190 lb 4.8 oz), last menstrual period 04/22/2012, SpO2 100.00%, unknown if currently breastfeeding.  Physical Exam:  General: alert, cooperative, appears stated age and no distress Lochia: appropriate Uterine Fundus: firm DVT Evaluation: No evidence of DVT seen on physical exam. No cords or calf tenderness. No significant calf/ankle edema.   Recent Labs  03/03/13 0216 03/03/13 0700  HGB 9.8* 8.3*  HCT 29.5* 25.4*    Assessment/Plan: Postpartum day 2  # Chronic hypertension with superimposed preeclampsia: S/p Magnesium in AICU, transferred yesterday to regular floor, with continued high blood pressures 140s-160s systolic yesterday. Added HCTZ 25mg  yesterday - increase norvasc to 10mg  daily - with headache and abdominal pain will recheck PIH labs  # URI, bronchitis  - Continue z-pack for bronchitis  # Dispo: - Plan for discharge this afternoon pending PIH labs if BP improves and labs WNL - F/u SW consult prior to discharge - Contraception: Plans Depo - Bottle feeding    LOS: 3 days   Simone Curia 03/05/2013, 8:40 AM   Evaluation and management procedures were performed by Resident physician under my supervision/collaboration. Chart reviewed, patient examined by me and I agree with management and plan. PreE labs pending. Danae Orleans, CNM 03/05/2013 9:44 AM

## 2013-03-10 ENCOUNTER — Emergency Department (HOSPITAL_BASED_OUTPATIENT_CLINIC_OR_DEPARTMENT_OTHER)
Admission: EM | Admit: 2013-03-10 | Discharge: 2013-03-10 | Disposition: A | Discharge status: Home / Self Care | Payer: Medicaid Other | Attending: Emergency Medicine | Admitting: Emergency Medicine

## 2013-03-10 ENCOUNTER — Encounter (HOSPITAL_BASED_OUTPATIENT_CLINIC_OR_DEPARTMENT_OTHER): Payer: Self-pay | Admitting: *Deleted

## 2013-03-10 DIAGNOSIS — M545 Low back pain, unspecified: Secondary | ICD-10-CM | POA: Insufficient documentation

## 2013-03-10 DIAGNOSIS — Z79899 Other long term (current) drug therapy: Secondary | ICD-10-CM | POA: Insufficient documentation

## 2013-03-10 DIAGNOSIS — Z8742 Personal history of other diseases of the female genital tract: Secondary | ICD-10-CM | POA: Insufficient documentation

## 2013-03-10 DIAGNOSIS — F1110 Opioid abuse, uncomplicated: Secondary | ICD-10-CM | POA: Insufficient documentation

## 2013-03-10 DIAGNOSIS — F489 Nonpsychotic mental disorder, unspecified: Secondary | ICD-10-CM | POA: Insufficient documentation

## 2013-03-10 DIAGNOSIS — Z792 Long term (current) use of antibiotics: Secondary | ICD-10-CM | POA: Insufficient documentation

## 2013-03-10 DIAGNOSIS — Z791 Long term (current) use of non-steroidal anti-inflammatories (NSAID): Secondary | ICD-10-CM | POA: Insufficient documentation

## 2013-03-10 DIAGNOSIS — Z87442 Personal history of urinary calculi: Secondary | ICD-10-CM | POA: Insufficient documentation

## 2013-03-10 DIAGNOSIS — F17200 Nicotine dependence, unspecified, uncomplicated: Secondary | ICD-10-CM | POA: Insufficient documentation

## 2013-03-10 DIAGNOSIS — F411 Generalized anxiety disorder: Secondary | ICD-10-CM | POA: Insufficient documentation

## 2013-03-10 DIAGNOSIS — Z22322 Carrier or suspected carrier of Methicillin resistant Staphylococcus aureus: Secondary | ICD-10-CM | POA: Insufficient documentation

## 2013-03-10 LAB — RAPID URINE DRUG SCREEN, HOSP PERFORMED
Barbiturates: NOT DETECTED
Benzodiazepines: NOT DETECTED
Cocaine: NOT DETECTED
Tetrahydrocannabinol: NOT DETECTED

## 2013-03-10 LAB — URINALYSIS, ROUTINE W REFLEX MICROSCOPIC
Bilirubin Urine: NEGATIVE
Hgb urine dipstick: NEGATIVE
Ketones, ur: NEGATIVE mg/dL
Nitrite: NEGATIVE
Specific Gravity, Urine: 1.017 (ref 1.005–1.030)
Urobilinogen, UA: 0.2 mg/dL (ref 0.0–1.0)

## 2013-03-10 MED ORDER — OXYCODONE-ACETAMINOPHEN 5-325 MG PO TABS
2.0000 | ORAL_TABLET | Freq: Once | ORAL | Status: AC
  Administered 2013-03-10: 2 via ORAL
  Filled 2013-03-10 (×2): qty 2

## 2013-03-10 NOTE — ED Notes (Signed)
Ice chips given

## 2013-03-10 NOTE — ED Notes (Signed)
#  8 FR in and out cath using sterile tech. Tolerated well. Clear yellow urine noted.

## 2013-03-10 NOTE — ED Provider Notes (Addendum)
History    CSN: [id number] Arrival date & time 03/10/13  0423  First MD Initiated Contact with Patient 03/10/13 [phone]     Chief Complaint  Patient presents with  . Back Pain   (Consider location/radiation/quality/duration/timing/severity/associated sxs/prior Treatment) HPI This is a 32 year old female who is 8 days [the patient states "3 days"] postpartum status post vaginal delivery. Per old records as well as state narcotic database she has a history of opiate abuse as well as a history of forging prescriptions. She was discharged July 8 [the patient states "yesterday"] with a prescription for 15 Percocet tablets.   She is here with pain at the site of her epidural. She states her epidural only gave her anesthesia on the left side of her body but not the right. She is now having "shooting" pains down her legs, left greater than right. She had been taking the Percocet that she was prescribed and is now out. She states the pain is worse with standing or walking. There is no numbness or weakness associated with it. She does have a headache. She does not have a stiff neck. She has not had fever or chills. She did vomit one time yesterday evening. She also complains of lower abdominal cramping and continued passage of lochia.   Past Medical History  Diagnosis Date  . MRSA (methicillin resistant staph aureus) culture positive   . Anxiety     currently no meds  . Pregnancy induced hypertension     first preg  . Drug abuse, opioid type     released from [facility] for falsifying prescriptions  . Pregnant   . Kidney stone   . Kidney stone   . Mental disorder 04/2011    states was advised for detox but couldnt due to 1st pregnancy   Past Surgical History  Procedure Laterality Date  . Wisdom tooth extraction  1995   Family History  Problem Relation Age of Onset  . Hypertension Mother    History  Substance Use Topics  . Smoking status: Current Every Day Smoker -- 0.50 packs/day for  12 years    Types: Cigarettes  . Smokeless tobacco: Never Used  . Alcohol Use: No   OB History   Grav Para Term Preterm Abortions TAB SAB Ect Mult Living   3 3 3  0 0 0 0 0 0 3     Review of Systems  All other systems reviewed and are negative.    Allergies  Citalopram hydrobromide; Propoxyphene-acetaminophen; and Tramadol  Home Medications   Current Outpatient Rx  Name  Route  Sig  Dispense  Refill  . acetaminophen (TYLENOL) 500 MG tablet   Oral   Take 500 mg by mouth every 6 (six) hours as needed for pain.         Marland Kitchen amLODipine (NORVASC) 10 MG tablet   Oral   Take 1 tablet (10 mg total) by mouth daily.   30 tablet   3   . azithromycin (ZITHROMAX) 250 MG tablet   Oral   Take 1 tablet (250 mg total) by mouth daily. 1 tab daily for 4 days   4 each   0   . hydrochlorothiazide (HYDRODIURIL) 25 MG tablet   Oral   Take 1 tablet (25 mg total) by mouth daily.   30 tablet   3   . ibuprofen (ADVIL,MOTRIN) 600 MG tablet   Oral   Take 1 tablet (600 mg total) by mouth every 6 (six) hours.   30  tablet   0   . oxyCODONE-acetaminophen (ROXICET) 5-325 MG per tablet   Oral   Take 1 tablet by mouth every 4 (four) hours as needed for pain.   15 tablet   0   . Prenatal Vit-Fe Fumarate-FA (PRENATAL MULTIVITAMIN) TABS   Oral   Take 1 tablet by mouth daily at 12 noon.          BP 159/98  Pulse 93  Temp(Src) 98.1 F (36.7 C) (Oral)  Ht 5\' 8"  (1.727 m)  Wt 178 lb (80.74 kg)  BMI 27.07 kg/m2  SpO2 100%  LMP 04/22/2012  Physical Exam General: Well-developed, well-nourished female in no acute distress; appearance consistent with age of record HENT: normocephalic, atraumatic Eyes: pupils equal round and reactive to light; extraocular muscles intact Neck: supple; no meningeal signs Heart: regular rate and rhythm; no murmurs, rubs or gallops Lungs: clear to auscultation bilaterally Abdomen: soft; nondistended; mild left and right suprapubic tenderness; no masses or  hepatosplenomegaly; bowel sounds present Back: Small puncture wound consistent with recent epidural without surrounding swelling, ecchymosis, erythema, warmth or drainage, mild tenderness on palpation; pain on straight leg raise on left side at about 15, pain on straight leg raise on the right side at about 40 Extremities: No deformity; full range of motion; pulses normal Neurologic: Awake, alert and oriented; motor function intact in all extremities and symmetric; no facial droop; normal coordination and speech; normal gait Skin: Warm and dry Psychiatric: Flat affect    ED Course  Procedures (including critical care time)   MDM   Nursing notes and vitals signs, including pulse oximetry, reviewed.  Summary of this visit's results, reviewed by myself:  Labs:  Results for orders placed during the hospital encounter of 03/10/13 (from the past 24 hour(s))  URINALYSIS, ROUTINE W REFLEX MICROSCOPIC     Status: None   Collection Time    03/10/13  5:05 AM      Result Value Range   Color, Urine YELLOW  YELLOW   APPearance CLEAR  CLEAR   Specific Gravity, Urine 1.017  1.005 - 1.030   pH 6.0  5.0 - 8.0   Glucose, UA NEGATIVE  NEGATIVE mg/dL   Hgb urine dipstick NEGATIVE  NEGATIVE   Bilirubin Urine NEGATIVE  NEGATIVE   Ketones, ur NEGATIVE  NEGATIVE mg/dL   Protein, ur NEGATIVE  NEGATIVE mg/dL   Urobilinogen, UA 0.2  0.0 - 1.0 mg/dL   Nitrite NEGATIVE  NEGATIVE   Leukocytes, UA NEGATIVE  NEGATIVE  URINE RAPID DRUG SCREEN (HOSP PERFORMED)     Status: None   Collection Time    03/10/13  5:05 AM      Result Value Range   Opiates NONE DETECTED  NONE DETECTED   Cocaine NONE DETECTED  NONE DETECTED   Benzodiazepines NONE DETECTED  NONE DETECTED   Amphetamines NONE DETECTED  NONE DETECTED   Tetrahydrocannabinol NONE DETECTED  NONE DETECTED   Barbiturates NONE DETECTED  NONE DETECTED   5:59 AM There is no evidence of an acute infection associated with the patient's epidural 8 days ago.  She states the position to place the epidural evaluated her postpartum and found no apparent complication. She was advised that in light of her history of narcotic abuse we will treat her with pain medication in the ED but not write her any prescriptions for narcotics. If she continues to have pain she needs to followup at [facility].  Hanley Seamen, MD 03/10/13 0600  Michaelann Gunnoe L Darleene Cumpian,  MD 03/10/13 1610

## 2013-03-10 NOTE — ED Notes (Signed)
MD with pt  

## 2013-03-10 NOTE — ED Notes (Addendum)
Pt states that she had an epidural on 03/02/13 that "did not go well" pt is 8 days post partum. C/o lower back pain that radiates down both legs. C/o feeling dizzy as well. States that she is out of her percocet that was prescribed at d/c. States she has been drinking fluids. Denies any fevers. Denies any urinary symptoms. Pt is having lochia. Denies stiff neck. No swelling or bruising noted at epidural site. States she did have emesis times one last night. States she has had some abd cramping. Denies any weakness to legs.

## 2013-03-13 ENCOUNTER — Encounter: Payer: Self-pay | Admitting: Obstetrics and Gynecology

## 2013-03-13 ENCOUNTER — Ambulatory Visit: Payer: Self-pay | Admitting: Obstetrics and Gynecology

## 2013-03-13 MED ORDER — OXYCODONE-ACETAMINOPHEN 5-325 MG PO TABS: 1.0000 | ORAL_TABLET | ORAL | Status: DC | PRN

## 2013-03-13 NOTE — Progress Notes (Signed)
Patient ID: Rebecca [name], female   DOB: 1981-07-03, 32 y.o.   MRN: [id number] . PPD#11  CC: Hypertension and Postpartum Care    HPI Rebecca [name] is a 32 y.o. G3P3003 now 11 days postpartum who presents with blood pressure elevation and headache, lower abdominal and low back [name]. Home health Nurse advised her to come in due to high blood pressure reading today. She is taking and Norvasc and hydrochlorothiazide as directed. Her delivery was complicated by  preeclampsia and she received magnesium postpartum. She was discharged on ibuprofen and Percocet and requests a Percocet refill. She is describing bandlike headache that from throbs, severe after pains and long-standing low back [name]. She feels this is worse since she had her epidural. She is also having more vaginal bleeding than expected with some clots. Denies orthostatic symptoms. No visual disturbance or epigastric [name]. She is bottlefeeding and baby is doing well.    Past Medical History  Diagnosis Date  . MRSA (methicillin resistant staph aureus) culture positive   . Anxiety     currently no meds  . Pregnancy induced hypertension     first preg  . Drug abuse, opioid type     released from [facility] for falsifying prescriptions  . Pregnant   . Kidney stone   . Kidney stone   . Mental disorder 04/2011    states was advised for detox but couldnt due to 1st pregnancy    OB History   Grav Para Term Preterm Abortions TAB SAB Ect Mult Living   3 3 3  0 0 0 0 0 0 3     # Outc Date GA Lbr Len/2nd Wgt Sex Del Anes PTL Lv   1 TRM 11/01 [redacted]w[redacted]d  9lb14oz(4.479kg) M SVD   Yes   2 TRM 7/14 [redacted]w[redacted]d 23:17 / 00:13 7lb14.8oz(3.595kg) M SVD EPI  Yes   3 TRM     M SVD EPI  Yes      Past Surgical History  Procedure Laterality Date  . Wisdom tooth extraction  1995    History   Social History  . Marital Status: Single    Spouse Name: Rebecca [name]    Number of Children: Rebecca [name]  . Years of Education: Rebecca [name]   Occupational History  . Not on  file.   Social History Main Topics  . Smoking status: Current Every Day Smoker -- 0.50 packs/day for 12 years    Types: Cigarettes  . Smokeless tobacco: Never Used  . Alcohol Use: No  . Drug Use: No     Comment: denies   . Sexually Active: Not Currently    Birth Control/ Protection: None   Other Topics Concern  . Not on file   Social History Narrative  . No narrative on file    Current Outpatient Prescriptions on File Prior to Visit  Medication Sig Dispense Refill  . acetaminophen (TYLENOL) 500 MG tablet Take 500 mg by mouth every 6 (six) hours as needed for [name].      Marland Kitchen amLODipine (NORVASC) 10 MG tablet Take 1 tablet (10 mg total) by mouth daily.  30 tablet  3  . azithromycin (ZITHROMAX) 250 MG tablet Take 1 tablet (250 mg total) by mouth daily. 1 tab daily for 4 days  4 each  0  . hydrochlorothiazide (HYDRODIURIL) 25 MG tablet Take 1 tablet (25 mg total) by mouth daily.  30 tablet  3  . ibuprofen (ADVIL,MOTRIN) 600 MG tablet Take 1 tablet (600 mg total) by mouth  every 6 (six) hours.  30 tablet  0  . oxyCODONE-acetaminophen (ROXICET) 5-325 MG per tablet Take 1 tablet by mouth every 4 (four) hours as needed for [name].  15 tablet  0  . Prenatal Vit-Fe Fumarate-FA (PRENATAL MULTIVITAMIN) TABS Take 1 tablet by mouth daily at 12 noon.       No current facility-administered medications on file prior to visit.    Allergies  Allergen Reactions  . Citalopram Hydrobromide Other (See Comments)    dizzINESS, causes panic attacks  . Propoxyphene-Acetaminophen Other (See Comments)    anxiety attacks  . Tramadol Palpitations    ROS Pertinent items in HPI  PHYSICAL EXAM Filed Vitals:   03/13/13 1332  BP: 150/100  Pulse: 98  Temp: 97.8 F (36.6 C)   General: Well nourished, well developed female looks mildly uncomfortable Cardiovascular: Normal rate Respiratory: Normal effort Abdomen: Soft, mildly tender, involuting well Back: No CVAT. L-S paraspinous muscle tenderness to  palpation Extremities: No edema Neurologic: Alert and oriented Bimanual exam: lochia scant, cx closed, nontender   ASSESSMENT  1. Hypertension complicating pregnancy, childbirth, or puerperium, third trimester     PLAN Discussed with Dr. Marice Potter: We will continue Norvasc and hydrochlorothiazide at current dosage and have home health continue to monitor blood pressure.  We will give her Percocet 5 mg/325 mg #10 only with no refill with the understanding that this is for breakthrough [name] only.  Return at 6 weeks for postpartum visit.     Rebecca [name], CNM 03/17/2013 4:59 PM

## 2013-03-13 NOTE — Patient Instructions (Addendum)

## 2013-03-28 ENCOUNTER — Ambulatory Visit (INDEPENDENT_AMBULATORY_CARE_PROVIDER_SITE_OTHER): Payer: Self-pay | Admitting: Obstetrics & Gynecology

## 2013-03-28 ENCOUNTER — Encounter: Payer: Self-pay | Admitting: Obstetrics & Gynecology

## 2013-03-28 MED ORDER — ALPRAZOLAM 0.25 MG PO TABS: 0.2500 mg | ORAL_TABLET | Freq: Three times a day (TID) | ORAL | Status: DC | PRN

## 2013-03-28 MED ORDER — FLUOXETINE HCL 20 MG PO CAPS: 20.0000 mg | ORAL_CAPSULE | Freq: Every day | ORAL | Status: AC

## 2013-03-28 NOTE — Progress Notes (Signed)
  Subjective:    Patient ID: Rebecca [name], female    DOB: 1981/06/28, 32 y.o.   MRN: [id number]  HPI 3 weeks postpartum, G3P3003 C/o low abdominal and low back pain, ibuprofen doesn't work. Has had problems with narcotic dependence, falsifying Rx    Review of Systems  Genitourinary: Positive for vaginal bleeding and pelvic pain. Negative for dysuria and vaginal discharge.  Neurological: Negative for dizziness.  Psychiatric/Behavioral: Positive for dysphoric mood and agitation. The patient is nervous/anxious.        Objective:   Physical Exam  Constitutional: She appears well-developed.  Pulmonary/Chest: Effort normal. No respiratory distress.  Abdominal: Soft. She exhibits no mass. There is no tenderness.  Psychiatric: Her behavior is normal.  Mildly anxious   Filed Vitals:   03/28/13 1449 03/28/13 1453  BP: 166/98 151/97  Pulse: 96 98  Temp: 97.1 F (36.2 C)   TempSrc: Oral   Weight: 164 lb 4.8 oz (74.526 kg)           Assessment & Plan:  Postpartum pain H/O narcotic abuse, was told we would not rx narcotics Ibuprofen prn Prozac 20 mg daily RTC 2 weeks  Adam Phenix, MD 03/28/2013 4:07 PM

## 2013-03-31 ENCOUNTER — Encounter (HOSPITAL_BASED_OUTPATIENT_CLINIC_OR_DEPARTMENT_OTHER): Payer: Self-pay

## 2013-03-31 ENCOUNTER — Emergency Department (HOSPITAL_BASED_OUTPATIENT_CLINIC_OR_DEPARTMENT_OTHER)
Admission: EM | Admit: 2013-03-31 | Discharge: 2013-03-31 | Disposition: A | Discharge status: Home / Self Care | Payer: Medicaid Other | Attending: Emergency Medicine | Admitting: Emergency Medicine

## 2013-03-31 DIAGNOSIS — Z87442 Personal history of urinary calculi: Secondary | ICD-10-CM | POA: Insufficient documentation

## 2013-03-31 DIAGNOSIS — Z8614 Personal history of Methicillin resistant Staphylococcus aureus infection: Secondary | ICD-10-CM | POA: Insufficient documentation

## 2013-03-31 DIAGNOSIS — K089 Disorder of teeth and supporting structures, unspecified: Secondary | ICD-10-CM | POA: Insufficient documentation

## 2013-03-31 DIAGNOSIS — Z79899 Other long term (current) drug therapy: Secondary | ICD-10-CM | POA: Insufficient documentation

## 2013-03-31 DIAGNOSIS — F17200 Nicotine dependence, unspecified, uncomplicated: Secondary | ICD-10-CM | POA: Insufficient documentation

## 2013-03-31 DIAGNOSIS — F411 Generalized anxiety disorder: Secondary | ICD-10-CM | POA: Insufficient documentation

## 2013-03-31 DIAGNOSIS — R110 Nausea: Secondary | ICD-10-CM | POA: Insufficient documentation

## 2013-03-31 DIAGNOSIS — R42 Dizziness and giddiness: Secondary | ICD-10-CM | POA: Insufficient documentation

## 2013-03-31 DIAGNOSIS — K0889 Other specified disorders of teeth and supporting structures: Secondary | ICD-10-CM

## 2013-03-31 MED ORDER — HYDROCODONE-ACETAMINOPHEN 5-325 MG PO TABS
1.0000 | ORAL_TABLET | Freq: Once | ORAL | Status: AC
  Administered 2013-03-31: 1 via ORAL
  Filled 2013-03-31: qty 1

## 2013-03-31 MED ORDER — LIDOCAINE VISCOUS 2 % MT SOLN: 15.0000 mL | OROMUCOSAL | Status: DC | PRN

## 2013-03-31 NOTE — ED Notes (Signed)
Patient here with left upper dental pain that she reports is a broken tooth and possible abcess. Also complains of vague dizziness and pain radiating up to left side of face.

## 2013-03-31 NOTE — ED Provider Notes (Signed)
CSN: [id number]     Arrival date & time 03/31/13  4098 History  This chart was scribed for Gerhard Munch, MD by Ardelia Mems, ED Scribe. This patient was seen in room MH07/MH07 and the patient's care was started at 7:41 PM.   First MD Initiated Contact with Patient 03/31/13 1937     Chief Complaint  Patient presents with  . Dental Pain    The history is provided by the patient. No language interpreter was used.   HPI Comments: Rebecca [name] is a 32 y.o. female with a history of opioid abuse who presents to the Emergency Department complaining of constant, moderate left upper dental pain that radiates to the left side of her face. She states that she has a broken tooth, and she believes that she has an abscess. She states that she has also had some dizziness. Pt states that she has been communicating with her dentist about her current pain She states that she couldn't; afford rc Had a filling put in which fell out Pt states that her current pain became much more constant and severe about 4 days ago.  Pt is a current every day smoker of 0.5 packs/day of 12 years, and she denies alcohol use. She reports associated nausea She states that she is healthy in general. She states that she had She denies fever, vomiting, confusion, disorientation or any other symptoms.  Past Medical History  Diagnosis Date  . MRSA (methicillin resistant staph aureus) culture positive   . Anxiety     currently no meds  . Pregnancy induced hypertension     first preg  . Drug abuse, opioid type     released from [facility] for falsifying prescriptions  . Pregnant   . Kidney stone   . Kidney stone   . Mental disorder 04/2011    states was advised for detox but couldnt due to 1st pregnancy   Past Surgical History  Procedure Laterality Date  . Wisdom tooth extraction  1995   Family History  Problem Relation Age of Onset  . Hypertension Mother    History  Substance Use Topics  . Smoking status:  Current Every Day Smoker -- 0.50 packs/day for 12 years    Types: Cigarettes  . Smokeless tobacco: Never Used  . Alcohol Use: No   OB History   Grav Para Term Preterm Abortions TAB SAB Ect Mult Living   3 3 3  0 0 0 0 0 0 3     Review of Systems  Constitutional:       Per HPI, otherwise negative  HENT:       Per HPI, otherwise negative  Respiratory:       Per HPI, otherwise negative  Cardiovascular:       Per HPI, otherwise negative  Gastrointestinal: Negative for vomiting.  Endocrine:       Negative aside from HPI  Genitourinary:       Neg aside from HPI   Musculoskeletal:       Per HPI, otherwise negative  Skin: Negative.   Neurological: Negative for syncope.  All other systems reviewed and are negative.    Allergies  Citalopram hydrobromide; Propoxyphene-acetaminophen; and Tramadol  Home Medications   Current Outpatient Rx  Name  Route  Sig  Dispense  Refill  . acetaminophen (TYLENOL) 500 MG tablet   Oral   Take 500 mg by mouth every 6 (six) hours as needed for pain.         Marland Kitchen  ALPRAZolam (XANAX) 0.25 MG tablet   Oral   Take 1 tablet (0.25 mg total) by mouth 3 (three) times daily as needed for anxiety.   15 tablet   0   . amLODipine (NORVASC) 10 MG tablet   Oral   Take 1 tablet (10 mg total) by mouth daily.   30 tablet   3   . azithromycin (ZITHROMAX) 250 MG tablet   Oral   Take 1 tablet (250 mg total) by mouth daily. 1 tab daily for 4 days   4 each   0   . FLUoxetine (PROZAC) 20 MG capsule   Oral   Take 1 capsule (20 mg total) by mouth daily.   30 capsule   2   . hydrochlorothiazide (HYDRODIURIL) 25 MG tablet   Oral   Take 1 tablet (25 mg total) by mouth daily.   30 tablet   3   . ibuprofen (ADVIL,MOTRIN) 600 MG tablet   Oral   Take 1 tablet (600 mg total) by mouth every 6 (six) hours.   30 tablet   0   . oxyCODONE-acetaminophen (PERCOCET/ROXICET) 5-325 MG per tablet   Oral   Take 1 tablet by mouth every 4 (four) hours as needed for  pain.   10 tablet   0   . oxyCODONE-acetaminophen (ROXICET) 5-325 MG per tablet   Oral   Take 1 tablet by mouth every 4 (four) hours as needed for pain.   15 tablet   0   . Prenatal Vit-Fe Fumarate-FA (PRENATAL MULTIVITAMIN) TABS   Oral   Take 1 tablet by mouth daily at 12 noon.          Triage Vitals: BP 170/97  Pulse 85  Temp(Src) 98.9 F (37.2 C) (Oral)  Resp 18  SpO2 100%  Physical Exam  Nursing note and vitals reviewed. Constitutional: She is oriented to person, place, and time. She appears well-developed and well-nourished. No distress.  HENT:  Head: Normocephalic and atraumatic.  3rd from posterior left upper tooth with gross decay, broken up unto gumline. Oropharynx clear.   Eyes: Conjunctivae and EOM are normal.  Neck: No tracheal deviation present.  No neck lesion.  Cardiovascular: Normal rate and regular rhythm.   Pulmonary/Chest: Effort normal and breath sounds normal. No stridor. No respiratory distress.  Abdominal: She exhibits no distension.  Musculoskeletal: She exhibits no edema.  Neurological: She is alert and oriented to person, place, and time. No cranial nerve deficit.  Skin: Skin is warm and dry.  Psychiatric: She has a normal mood and affect.    ED Course   Procedures (including critical care time)  DIAGNOSTIC STUDIES: Oxygen Saturation is 100% on RA, normal by my interpretation.    COORDINATION OF CARE: 8:02 PM- Pt advised that her history of opioid abuse is known, and alternative means of managing her pain for the next few days are discussed. Pt states that she  Labs Reviewed - No data to display  No results found.  No diagnosis found.  MDM   I personally performed the services described in this documentation, which was scribed in my presence. The recorded information has been reviewed and is accurate.   Patient presents with a fractured tooth and ongoing pain.  She is afebrile, in no distress, with a clear posterior oral  pharynx.  After lengthy discussion on the need for additional pain management of your primary care, the patient was discharged with no new narcotics.  Gerhard Munch, MD 03/31/13 2017

## 2013-03-31 NOTE — ED Notes (Addendum)
Pt reports left upper molar pain that causing pain to face, jaw, and neck has been on going for some time but has worsened over the last several days

## 2013-04-08 ENCOUNTER — Inpatient Hospital Stay (HOSPITAL_COMMUNITY): Payer: Medicaid Other

## 2013-04-08 ENCOUNTER — Inpatient Hospital Stay (HOSPITAL_COMMUNITY)
Admission: AD | Admit: 2013-04-08 | Discharge: 2013-04-09 | DRG: 776 | Disposition: A | Discharge status: Home / Self Care | Payer: Medicaid Other | Source: Ambulatory Visit | Attending: Obstetrics & Gynecology | Admitting: Obstetrics & Gynecology

## 2013-04-08 ENCOUNTER — Encounter (HOSPITAL_COMMUNITY): Payer: Self-pay | Admitting: *Deleted

## 2013-04-08 ENCOUNTER — Telehealth: Payer: Self-pay | Admitting: Obstetrics & Gynecology

## 2013-04-08 ENCOUNTER — Ambulatory Visit: Payer: Self-pay | Admitting: Obstetrics and Gynecology

## 2013-04-08 DIAGNOSIS — F3289 Other specified depressive episodes: Secondary | ICD-10-CM | POA: Diagnosis present

## 2013-04-08 DIAGNOSIS — I10 Essential (primary) hypertension: Secondary | ICD-10-CM | POA: Diagnosis present

## 2013-04-08 DIAGNOSIS — IMO0002 Reserved for concepts with insufficient information to code with codable children: Principal | ICD-10-CM | POA: Diagnosis present

## 2013-04-08 DIAGNOSIS — O1415 Severe pre-eclampsia, complicating the puerperium: Secondary | ICD-10-CM | POA: Diagnosis present

## 2013-04-08 DIAGNOSIS — F329 Major depressive disorder, single episode, unspecified: Secondary | ICD-10-CM

## 2013-04-08 DIAGNOSIS — O99345 Other mental disorders complicating the puerperium: Secondary | ICD-10-CM | POA: Diagnosis present

## 2013-04-08 DIAGNOSIS — O1410 Severe pre-eclampsia, unspecified trimester: Secondary | ICD-10-CM | POA: Diagnosis present

## 2013-04-08 LAB — URINALYSIS, ROUTINE W REFLEX MICROSCOPIC
Glucose, UA: NEGATIVE mg/dL
Hgb urine dipstick: NEGATIVE
Leukocytes, UA: NEGATIVE
pH: 6 (ref 5.0–8.0)

## 2013-04-08 LAB — COMPREHENSIVE METABOLIC PANEL
Alkaline Phosphatase: 71 U/L (ref 39–117)
BUN: 14 mg/dL (ref 6–23)
CO2: 20 mEq/L (ref 19–32)
Chloride: 105 mEq/L (ref 96–112)
Creatinine, Ser: 0.94 mg/dL (ref 0.50–1.10)
GFR calc Af Amer: >90 mL/min (ref >90)
GFR calc non Af Amer: 79 mL/min — ABNORMAL LOW (ref >90)
Glucose, Bld: 101 mg/dL — ABNORMAL HIGH (ref 70–99)
Potassium: 3.7 mEq/L (ref 3.5–5.1)
Total Bilirubin: 0.3 mg/dL (ref 0.3–1.2)

## 2013-04-08 LAB — RAPID URINE DRUG SCREEN, HOSP PERFORMED
Amphetamines: NOT DETECTED
Barbiturates: NOT DETECTED
Benzodiazepines: POSITIVE — AB
Cocaine: NOT DETECTED
Tetrahydrocannabinol: NOT DETECTED

## 2013-04-08 LAB — CBC
HCT: 43.8 % (ref 36.0–46.0)
Hemoglobin: 11.1 g/dL — ABNORMAL LOW (ref 12.0–15.0)
MCV: 101.2 fL — ABNORMAL HIGH (ref 78.0–100.0)
WBC: 10.1 10*3/uL (ref 4.0–10.5)

## 2013-04-08 LAB — PROTEIN / CREATININE RATIO, URINE: Creatinine, Urine: 234.79 mg/dL

## 2013-04-08 LAB — URINE MICROSCOPIC-ADD ON

## 2013-04-08 MED ORDER — MAGNESIUM SULFATE 40 G IN LACTATED RINGERS - SIMPLE
2.0000 g/h | INTRAVENOUS | Status: AC
  Administered 2013-04-08: 2 g/h via INTRAVENOUS
  Filled 2013-04-08: qty 500

## 2013-04-08 MED ORDER — ZOLPIDEM TARTRATE 5 MG PO TABS
5.0000 mg | ORAL_TABLET | Freq: Every evening | ORAL | Status: DC | PRN
  Administered 2013-04-08: 5 mg via ORAL
  Filled 2013-04-08: qty 1

## 2013-04-08 MED ORDER — HYDROMORPHONE HCL 2 MG PO TABS
2.0000 mg | ORAL_TABLET | ORAL | Status: DC | PRN
  Administered 2013-04-08 – 2013-04-09 (×7): 2 mg via ORAL
  Filled 2013-04-08 (×7): qty 1

## 2013-04-08 MED ORDER — ONDANSETRON HCL 4 MG/2ML IJ SOLN: 4.0000 mg | Freq: Four times a day (QID) | INTRAMUSCULAR | Status: DC | PRN

## 2013-04-08 MED ORDER — HYDROMORPHONE HCL PF 1 MG/ML IJ SOLN: 0.2000 mg | INTRAMUSCULAR | Status: DC | PRN

## 2013-04-08 MED ORDER — ACETAMINOPHEN 500 MG PO TABS
1000.0000 mg | ORAL_TABLET | Freq: Four times a day (QID) | ORAL | Status: DC | PRN
  Administered 2013-04-08: 1000 mg via ORAL
  Filled 2013-04-08: qty 1

## 2013-04-08 MED ORDER — LORAZEPAM 2 MG/ML IJ SOLN
1.0000 mg | Freq: Four times a day (QID) | INTRAMUSCULAR | Status: DC | PRN
  Administered 2013-04-08 – 2013-04-09 (×4): 1 mg via INTRAVENOUS
  Filled 2013-04-08 (×4): qty 1

## 2013-04-08 MED ORDER — ONDANSETRON HCL 4 MG PO TABS: 4.0000 mg | ORAL_TABLET | Freq: Four times a day (QID) | ORAL | Status: DC | PRN

## 2013-04-08 MED ORDER — ALPRAZOLAM 0.25 MG PO TABS
0.2500 mg | ORAL_TABLET | Freq: Three times a day (TID) | ORAL | Status: DC | PRN
  Administered 2013-04-08 – 2013-04-09 (×4): 0.25 mg via ORAL
  Filled 2013-04-08 (×4): qty 1

## 2013-04-08 MED ORDER — HYDRALAZINE HCL 20 MG/ML IJ SOLN
10.0000 mg | INTRAMUSCULAR | Status: DC | PRN
  Administered 2013-04-08: 12:00:00 via INTRAVENOUS
  Administered 2013-04-08: 10 mg via INTRAVENOUS
  Filled 2013-04-08 (×2): qty 1

## 2013-04-08 MED ORDER — FLUOXETINE HCL 20 MG PO CAPS
20.0000 mg | ORAL_CAPSULE | Freq: Every day | ORAL | Status: DC
  Administered 2013-04-08 – 2013-04-09 (×2): 20 mg via ORAL
  Filled 2013-04-08 (×3): qty 1

## 2013-04-08 MED ORDER — AMLODIPINE BESYLATE 10 MG PO TABS
10.0000 mg | ORAL_TABLET | Freq: Every day | ORAL | Status: DC
  Administered 2013-04-08 – 2013-04-09 (×2): 10 mg via ORAL
  Filled 2013-04-08 (×3): qty 1

## 2013-04-08 MED ORDER — LACTATED RINGERS IV SOLN
INTRAVENOUS | Status: DC
  Administered 2013-04-08 – 2013-04-09 (×3): via INTRAVENOUS

## 2013-04-08 MED ORDER — DOCUSATE SODIUM 100 MG PO CAPS
100.0000 mg | ORAL_CAPSULE | Freq: Two times a day (BID) | ORAL | Status: DC | PRN
  Filled 2013-04-08: qty 1

## 2013-04-08 MED ORDER — HYDROMORPHONE HCL PF 1 MG/ML IJ SOLN
1.0000 mg | INTRAMUSCULAR | Status: DC | PRN
  Administered 2013-04-08: 2 mg via INTRAVENOUS
  Administered 2013-04-08: 1 mg via INTRAVENOUS
  Administered 2013-04-08: 2 mg via INTRAVENOUS
  Filled 2013-04-08 (×2): qty 2
  Filled 2013-04-08: qty 1

## 2013-04-08 MED ORDER — LACTATED RINGERS IV SOLN: INTRAVENOUS | Status: DC

## 2013-04-08 MED ORDER — PRENATAL MULTIVITAMIN CH
1.0000 | ORAL_TABLET | Freq: Every day | ORAL | Status: DC
  Administered 2013-04-08 – 2013-04-09 (×2): 1 via ORAL
  Filled 2013-04-08 (×3): qty 1

## 2013-04-08 MED ORDER — HYDROCHLOROTHIAZIDE 25 MG PO TABS
25.0000 mg | ORAL_TABLET | Freq: Every day | ORAL | Status: DC
  Administered 2013-04-08 – 2013-04-09 (×2): 25 mg via ORAL
  Filled 2013-04-08 (×3): qty 1

## 2013-04-08 MED ORDER — IBUPROFEN 600 MG PO TABS
600.0000 mg | ORAL_TABLET | Freq: Four times a day (QID) | ORAL | Status: DC
  Administered 2013-04-08 – 2013-04-09 (×5): 600 mg via ORAL
  Filled 2013-04-08 (×5): qty 1

## 2013-04-08 MED ORDER — HYDRALAZINE HCL 20 MG/ML IJ SOLN: 10.0000 mg | Freq: Once | INTRAMUSCULAR | Status: DC

## 2013-04-08 MED ORDER — MAGNESIUM SULFATE BOLUS VIA INFUSION
4.0000 g | Freq: Once | INTRAVENOUS | Status: AC
  Administered 2013-04-08: 4 g via INTRAVENOUS
  Filled 2013-04-08: qty 500

## 2013-04-08 NOTE — H&P (Addendum)
History   Rebecca [name] is a 32 y.o. G3P3003 who is ~ 8 weeks PP who presents to MAU today with elevated BP, headache and anxiety/depression. She is taking Norvasc as directed. She takes BPs at home and they are often high. She states frequent headaches with little relief from Ibuprofen and tylenol. She denies blurred vision, peripheral edema or RUQ pain. She is having mild lower abdominal cramping and low back pain. She is bottle-feeding at this time.   OB History    Grav  Para  Term  Preterm  Abortions  TAB  SAB  Ect  Mult  Living    3  3  3   0  0  0  0  0  0  3      Past Medical History   Diagnosis  Date   .  MRSA (methicillin resistant staph aureus) culture positive    .  Anxiety      currently no meds   .  Pregnancy induced hypertension      first preg   .  Drug abuse, opioid type      released from [facility] for falsifying prescriptions   .  Pregnant    .  Kidney stone    .  Kidney stone    .  Mental disorder  04/2011     states was advised for detox but couldnt due to 1st pregnancy    Past Surgical History   Procedure  Laterality  Date   .  Wisdom tooth extraction   1995    Family History   Problem  Relation  Age of Onset   .  Hypertension  Mother     History   Substance Use Topics   .  Smoking status:  Current Every Day Smoker -- 0.50 packs/day for 12 years     Types:  Cigarettes   .  Smokeless tobacco:  Never Used   .  Alcohol Use:  No   Allergies:  Allergies   Allergen  Reactions   .  Citalopram Hydrobromide  Other (See Comments)     dizzINESS, causes panic attacks   .  Propoxyphene-Acetaminophen  Other (See Comments)     anxiety attacks   .  Tramadol  Palpitations    Prescriptions prior to admission   Medication  Sig  Dispense  Refill   .  acetaminophen (TYLENOL) 500 MG tablet  Take 500 mg by mouth every 6 (six) hours as needed for pain.     Marland Kitchen  amLODipine (NORVASC) 10 MG tablet  Take 1 tablet (10 mg total) by mouth daily.  30 tablet  3   .   FLUoxetine (PROZAC) 20 MG capsule  Take 1 capsule (20 mg total) by mouth daily.  30 capsule  2   .  ibuprofen (ADVIL,MOTRIN) 600 MG tablet  Take 1 tablet (600 mg total) by mouth every 6 (six) hours.  30 tablet  0   .  ALPRAZolam (XANAX) 0.25 MG tablet  Take 1 tablet (0.25 mg total) by mouth 3 (three) times daily as needed for anxiety.  15 tablet  0   .  [DISCONTINUED] Prenatal Vit-Fe Fumarate-FA (PRENATAL MULTIVITAMIN) TABS  Take 1 tablet by mouth daily at 12 noon.     Review of Systems  Constitutional: Negative for fever.  Eyes: Negative for blurred vision.  Gastrointestinal: Positive for abdominal pain.  Musculoskeletal: Positive for back pain.  Neurological: Positive for headaches.   Physical Exam  Blood pressure 174/101, pulse 88, temperature 98.4 F (36.9 C), temperature source Oral, resp. rate 18, height 5\' 8"  (1.727 m), weight 164 lb (74.39 kg).  Physical Exam  Constitutional: She is oriented to person, place, and time. She appears well-developed and well-nourished. No distress.  HENT:  Head: Normocephalic and atraumatic.  Cardiovascular: Normal rate.  Respiratory: Effort normal.  Neurological: She is alert and oriented to person, place, and time.  Skin: Skin is warm and dry. No erythema.  Psychiatric: Her mood appears anxious.   Results for orders placed during the hospital encounter of 04/08/13 (from the past 24 hour(s))  URINALYSIS, ROUTINE W REFLEX MICROSCOPIC     Status: Abnormal   Collection Time    04/08/13  9:15 AM      Result Value Range   Color, Urine YELLOW  YELLOW   APPearance CLEAR  CLEAR   Specific Gravity, Urine >1.030 (*) 1.005 - 1.030   pH 6.0  5.0 - 8.0   Glucose, UA NEGATIVE  NEGATIVE mg/dL   Hgb urine dipstick NEGATIVE  NEGATIVE   Bilirubin Urine NEGATIVE  NEGATIVE   Ketones, ur NEGATIVE  NEGATIVE mg/dL   Protein, ur 161 (*) NEGATIVE mg/dL   Urobilinogen, UA 0.2  0.0 - 1.0 mg/dL   Nitrite NEGATIVE  NEGATIVE   Leukocytes, UA NEGATIVE  NEGATIVE   PROTEIN / CREATININE RATIO, URINE     Status: Abnormal   Collection Time    04/08/13  9:15 AM      Result Value Range   Creatinine, Urine 234.79     Total Protein, Urine 127     PROTEIN CREATININE RATIO 0.54 (*) 0.00 - 0.15  URINE MICROSCOPIC-ADD ON     Status: None   Collection Time    04/08/13  9:15 AM      Result Value Range   Squamous Epithelial / LPF RARE  RARE   WBC, UA 0-2  <3 WBC/hpf   Bacteria, UA RARE  RARE  CBC     Status: Abnormal   Collection Time    04/08/13 10:05 AM      Result Value Range   WBC 10.1  4.0 - 10.5 K/uL   RBC 4.33  3.87 - 5.11 MIL/uL   Hemoglobin 11.1 (*) 12.0 - 15.0 g/dL   HCT 09.6  04.5 - 40.9 %   MCV 101.2 (*) 78.0 - 100.0 fL   MCH 25.6 (*) 26.0 - 34.0 pg   MCHC 25.3 (*) 30.0 - 36.0 g/dL   RDW 81.1 (*) 91.4 - 78.2 %   Platelets 291  150 - 400 K/uL  COMPREHENSIVE METABOLIC PANEL     Status: Abnormal   Collection Time    04/08/13 10:05 AM      Result Value Range   Sodium 138  135 - 145 mEq/L   Potassium 3.7  3.5 - 5.1 mEq/L   Chloride 105  96 - 112 mEq/L   CO2 20  19 - 32 mEq/L   Glucose, Bld 101 (*) 70 - 99 mg/dL   BUN 14  6 - 23 mg/dL   Creatinine, Ser 9.56  0.50 - 1.10 mg/dL   Calcium 9.5  8.4 - 21.3 mg/dL   Total Protein 7.6  6.0 - 8.3 g/dL   Albumin 4.5  3.5 - 5.2 g/dL   AST 9  0 - 37 U/L   ALT 6  0 - 35 U/L   Alkaline Phosphatase 71  39 - 117 U/L   Total Bilirubin 0.3  0.3 - 1.2 mg/dL   GFR calc non Af Amer 79 (*) >90 mL/min   GFR calc Af Amer >90  >90 mL/min     04/08/2013 CT HEAD WITHOUT CONTRAST Clinical Data: Severe headache, elevated blood pressure, 7 weeks postpartum  Technique: Contiguous axial images were obtained from the base of the skull through the vertex without contrast. Comparison: 04/28/2009 Findings: The normal sulcation with no abnormal attenuation to suggest hemorrhage, extra-axial fluid, or vascular territory infarct. No evidence of mass or hydrocephalus. Calvarium is intact. IMPRESSION: Negative study Original  Report Authenticated By: Esperanza Heir, M.D.   MAU Course   Discussed patient with Dr. Macon Large. She will order labs and antihypertensives for patient as well as CT head.  Labs and BPs discussed with Dr. Macon Large. 10 mg additional Hydralazine ordered and admit to AICU for Mg  Dr. Macon Large also requests UDS. Ordered in Epic.  Assessment and Plan   A: Postpartum pre-eclampsia  P: Admit to AICU for Mg and antihypertensives  Freddi Starr, PA-C  04/08/2013, 11:05 AM     Attestation of Attending Supervision of Advanced Practitioner (PA/CNM/NP): Evaluation and management procedures were performed by the Advanced Practitioner under my supervision and collaboration.  I have reviewed the Advanced Practitioner's note and chart, and I agree with the management and plan.  Jaynie Collins, MD, FACOG Attending Obstetrician & Gynecologist Faculty Practice, [facility] [place]

## 2013-04-08 NOTE — Telephone Encounter (Signed)
Received a call from patient this morning about panic attack, and high blood pressure. Patient wanted to come in and be seen this morning. She is a GYN patient. I told her we were seeing OB patients this morning, but to hold on I was going to talk with a nurse. I spoke with [facility] LPN, and she instructed me to see if she would be willing to come in this afternoon due to no appointments this morning. Or she may go to MAU if she could not wait until this afternoon. I made patient appointment for this afternoon, but then she stated she would go to MAU because her anxiety was just too much. I informed patient that I would leave the appointment in case she decided to come to the Clinic, but to call if she went to MAU so I could cancel this appointment. Patient stated she understood.

## 2013-04-08 NOTE — Progress Notes (Signed)
CSW met with pt in MAU hospital room to assess depression symptoms.  Pt & FOB of her 1 months old child recently separated after a 4 year relationship.  She identifies the break up, as the main source of depression/anxiety.  She was prescribed Prozac 20mg  & Xanax .25mg  on 03/28/13 during a [facility] clinic appointment.  Pt told CSW that the Prozac makes her "jittery," but she plan to continue taking them upon , since they worked for her in the past.  The Xanax works well for her but she told CSW that she has to take 2 at a time, for immediate relief.  The pt was given 15 Xanax pills so she does not have anymore.  Pt would like a prescription for more Xanax at higher dose.  She denies any SI.  Since pt & FOB are no longer together, she moved in with her grandfather.  Pt has a counseling appointment schedule 04/15/13, arranged by her Maternity Care Coordinator, Berna Bue.  During conversation, pt learned that she would be admitted to the hospital.  Pt express concern for child care after she was told by medical staff that infant would not be allowed to stay, without another adult present.  Pt asked CSW to go tell her grandfather & escort him to her room.  Pt's grandfather was sitting in his car when CSW approached him.  After pt's grandfather learned that pt was being hospitalized he said "messing with that dope again." CSW questioned him for clarification & he said "oxycodone, hydrocodone, I don't know.  What ever these kids mess with."  This is a concern, since this CSW met with pt after delivery to address her history of opiate abuse.  Pt does not have custody of her oldest children & CPS is currently involved with this pt now.  It appears that pt is medication seeking.  CSW will follow drug screen results & will contact pt's current CPS worker if necessary.

## 2013-04-08 NOTE — MAU Note (Addendum)
Patient presents to MAU with c/o increased anxiety and panic attacks, escalating since yesterday. Reports she was put on Xanax and Prozac last week at 6-week follow-up.  Reports crying spells and feeling overwhelmed. Denies feelings of wanting to harm baby, self or anyone else. Patient s/p VD on 03/04/13; hx of preeclampsia.  Patient reports HA and intermittent lower abdominal cramping.

## 2013-04-08 NOTE — MAU Provider Note (Signed)
History     CSN: [id number]  Arrival date and time: 04/08/13 4098   First Provider Initiated Contact with Patient 04/08/13 0933      No chief complaint on file.  HPI [name] is a 32 y.o. G3P3003 who is ~ 8 weeks PP who presents to MAU today with elevated BP, headache and anxiety/depression. She is taking Norvasc as directed. She takes BPs at home and they are often high. She states frequent headaches with little relief from Ibuprofen and tylenol. She denies blurred vision, peripheral edema or RUQ pain. She is having mild lower abdominal cramping and low back pain. She is bottle-feeding at this time.   OB History   Grav Para Term Preterm Abortions TAB SAB Ect Mult Living   3 3 3  0 0 0 0 0 0 3      Past Medical History  Diagnosis Date  . MRSA (methicillin resistant staph aureus) culture positive   . Anxiety     currently no meds  . Pregnancy induced hypertension     first preg  . Drug abuse, opioid type     released from [facility] for falsifying prescriptions  . Pregnant   . Kidney stone   . Kidney stone   . Mental disorder 04/2011    states was advised for detox but couldnt due to 1st pregnancy    Past Surgical History  Procedure Laterality Date  . Wisdom tooth extraction  1995    Family History  Problem Relation Age of Onset  . Hypertension Mother     History  Substance Use Topics  . Smoking status: Current Every Day Smoker -- 0.50 packs/day for 12 years    Types: Cigarettes  . Smokeless tobacco: Never Used  . Alcohol Use: No    Allergies:  Allergies  Allergen Reactions  . Citalopram Hydrobromide Other (See Comments)    dizzINESS, causes panic attacks  . Propoxyphene-Acetaminophen Other (See Comments)    anxiety attacks  . Tramadol Palpitations    Prescriptions prior to admission  Medication Sig Dispense Refill  . acetaminophen (TYLENOL) 500 MG tablet Take 500 mg by mouth every 6 (six) hours as needed for pain.      Marland Kitchen amLODipine  (NORVASC) 10 MG tablet Take 1 tablet (10 mg total) by mouth daily.  30 tablet  3  . FLUoxetine (PROZAC) 20 MG capsule Take 1 capsule (20 mg total) by mouth daily.  30 capsule  2  . ibuprofen (ADVIL,MOTRIN) 600 MG tablet Take 1 tablet (600 mg total) by mouth every 6 (six) hours.  30 tablet  0  . ALPRAZolam (XANAX) 0.25 MG tablet Take 1 tablet (0.25 mg total) by mouth 3 (three) times daily as needed for anxiety.  15 tablet  0  . [DISCONTINUED] Prenatal Vit-Fe Fumarate-FA (PRENATAL MULTIVITAMIN) TABS Take 1 tablet by mouth daily at 12 noon.        Review of Systems  Constitutional: Negative for fever.  Eyes: Negative for blurred vision.  Gastrointestinal: Positive for abdominal pain.  Musculoskeletal: Positive for back pain.  Neurological: Positive for headaches.   Physical Exam   Blood pressure 174/101, pulse 88, temperature 98.4 F (36.9 C), temperature source Oral, resp. rate 18, height 5\' 8"  (1.727 m), weight 164 lb (74.39 kg).  Physical Exam  Constitutional: She is oriented to person, place, and time. She appears well-developed and well-nourished. No distress.  HENT:  Head: Normocephalic and atraumatic.  Cardiovascular: Normal rate.   Respiratory: Effort normal.  Neurological: She is alert and oriented to person, place, and time.  Skin: Skin is warm and dry. No erythema.  Psychiatric: Her mood appears anxious.   Results for orders placed during the hospital encounter of 04/08/13 (from the past 24 hour(s))  URINALYSIS, ROUTINE W REFLEX MICROSCOPIC     Status: Abnormal   Collection Time    04/08/13  9:15 AM      Result Value Range   Color, Urine YELLOW  YELLOW   APPearance CLEAR  CLEAR   Specific Gravity, Urine >1.030 (*) 1.005 - 1.030   pH 6.0  5.0 - 8.0   Glucose, UA NEGATIVE  NEGATIVE mg/dL   Hgb urine dipstick NEGATIVE  NEGATIVE   Bilirubin Urine NEGATIVE  NEGATIVE   Ketones, ur NEGATIVE  NEGATIVE mg/dL   Protein, ur 409 (*) NEGATIVE mg/dL   Urobilinogen, UA 0.2  0.0 -  1.0 mg/dL   Nitrite NEGATIVE  NEGATIVE   Leukocytes, UA NEGATIVE  NEGATIVE  URINE MICROSCOPIC-ADD ON     Status: None   Collection Time    04/08/13  9:15 AM      Result Value Range   Squamous Epithelial / LPF RARE  RARE   WBC, UA 0-2  <3 WBC/hpf   Bacteria, UA RARE  RARE  CBC     Status: Abnormal   Collection Time    04/08/13 10:05 AM      Result Value Range   WBC 10.1  4.0 - 10.5 K/uL   RBC 4.33  3.87 - 5.11 MIL/uL   Hemoglobin 11.1 (*) 12.0 - 15.0 g/dL   HCT 81.1  91.4 - 78.2 %   MCV 101.2 (*) 78.0 - 100.0 fL   MCH 25.6 (*) 26.0 - 34.0 pg   MCHC 25.3 (*) 30.0 - 36.0 g/dL   RDW 95.6 (*) 21.3 - 08.6 %   Platelets 291  150 - 400 K/uL  COMPREHENSIVE METABOLIC PANEL     Status: Abnormal   Collection Time    04/08/13 10:05 AM      Result Value Range   Sodium 138  135 - 145 mEq/L   Potassium 3.7  3.5 - 5.1 mEq/L   Chloride 105  96 - 112 mEq/L   CO2 20  19 - 32 mEq/L   Glucose, Bld 101 (*) 70 - 99 mg/dL   BUN 14  6 - 23 mg/dL   Creatinine, Ser 5.78  0.50 - 1.10 mg/dL   Calcium 9.5  8.4 - 46.9 mg/dL   Total Protein 7.6  6.0 - 8.3 g/dL   Albumin 4.5  3.5 - 5.2 g/dL   AST 9  0 - 37 U/L   ALT 6  0 - 35 U/L   Alkaline Phosphatase 71  39 - 117 U/L   Total Bilirubin 0.3  0.3 - 1.2 mg/dL   GFR calc non Af Amer 79 (*) >90 mL/min   GFR calc Af Amer >90  >90 mL/min   Ct Head Wo Contrast  04/08/2013   *RADIOLOGY REPORT*  Clinical Data: Severe headache, elevated blood pressure, 7 weeks postpartum  CT HEAD WITHOUT CONTRAST  Technique:  Contiguous axial images were obtained from the base of the skull through the vertex without contrast.  Comparison: 04/28/2009  Findings: The normal sulcation with no abnormal attenuation to suggest hemorrhage, extra-axial fluid, or vascular territory infarct.  No evidence of mass or hydrocephalus.  Calvarium is intact.  IMPRESSION: Negative study   Original Report Authenticated By: Esperanza Heir, M.D.  MAU Course  Procedures None  MDM Discussed  with Dr. Macon Large. She will order labs and medications for patient as well as CT head.  Labs and BPs discussed with Dr. Macon Large. Can given additional 10 mg of Hydralazine and admit to AICU for Mg 10 mg additional Hydralazine ordered. Dr. Macon Large also requests UDS. Ordered in Epic.  Assessment and Plan  A: Postpartum pre-eclampsia  P: Admit to AICU for Mg  Freddi Starr, PA-C  04/08/2013, 11:05 AM

## 2013-04-08 NOTE — MAU Provider Note (Signed)
Attestation of Attending Supervision of Advanced Practitioner (PA/CNM/NP): Evaluation and management procedures were performed by the Advanced Practitioner under my supervision and collaboration.  I have reviewed the Advanced Practitioner's note and chart, and I agree with the management and plan.  Nellene Courtois, MD, FACOG Attending Obstetrician & Gynecologist Faculty Practice, [facility]  

## 2013-04-09 DIAGNOSIS — IMO0002 Reserved for concepts with insufficient information to code with codable children: Secondary | ICD-10-CM

## 2013-04-09 DIAGNOSIS — F329 Major depressive disorder, single episode, unspecified: Secondary | ICD-10-CM

## 2013-04-09 DIAGNOSIS — O99345 Other mental disorders complicating the puerperium: Secondary | ICD-10-CM

## 2013-04-09 LAB — COMPREHENSIVE METABOLIC PANEL
ALT: 7 U/L (ref 0–35)
AST: 16 U/L (ref 0–37)
CO2: 22 mEq/L (ref 19–32)
Calcium: 7.6 mg/dL — ABNORMAL LOW (ref 8.4–10.5)
Chloride: 101 mEq/L (ref 96–112)
GFR calc non Af Amer: >90 mL/min (ref >90)
Potassium: 3.1 mEq/L — ABNORMAL LOW (ref 3.5–5.1)
Sodium: 136 mEq/L (ref 135–145)
Total Bilirubin: 0.1 mg/dL — ABNORMAL LOW (ref 0.3–1.2)

## 2013-04-09 LAB — CBC
Platelets: 273 10*3/uL (ref 150–400)
RBC: 3.8 MIL/uL — ABNORMAL LOW (ref 3.87–5.11)
WBC: 10.6 10*3/uL — ABNORMAL HIGH (ref 4.0–10.5)

## 2013-04-09 MED ORDER — OXYCODONE-ACETAMINOPHEN 5-325 MG PO TABS: 1.0000 | ORAL_TABLET | ORAL | Status: DC | PRN

## 2013-04-09 MED ORDER — HYDROCHLOROTHIAZIDE 25 MG PO TABS: 25.0000 mg | ORAL_TABLET | Freq: Every day | ORAL | Status: AC

## 2013-04-09 MED ORDER — AMLODIPINE BESYLATE 10 MG PO TABS: 10.0000 mg | ORAL_TABLET | Freq: Every day | ORAL | Status: DC

## 2013-04-09 MED ORDER — BUTALBITAL-APAP-CAFFEINE 50-325-40 MG PO TABS: 2.0000 | ORAL_TABLET | Freq: Four times a day (QID) | ORAL | Status: DC | PRN

## 2013-04-09 MED ORDER — FLUOXETINE HCL 20 MG PO CAPS: 20.0000 mg | ORAL_CAPSULE | Freq: Every day | ORAL | Status: DC

## 2013-04-09 NOTE — Discharge Summary (Cosign Needed)
Physician Discharge Summary   Patient ID: Rebecca [name] [id number] 32 y.o. 05-Apr-1981  Admit date: 04/08/2013  Discharge date and time: No discharge date for patient encounter.   Admitting Physician: Tereso Newcomer, MD   Discharge Physician: Jolyn Lent, MD  Admission Diagnoses: PP,HA,HIGH BP,LOW BACK AND ABD PAIN  Discharge Diagnoses: Severe headache, HTN, Anxiety  Admission Condition: fair  Discharged Condition: stable  Indication for Admission: Preeclampsia  Hospital Course: Pt was admitted for blood pressure control postpartum for concerns of preeclampsia. Pt also required IV pain medication and xanax to control her headaches. Pt headache mild but stable. Pt Blood pressure has improved on oral BP meds. Will continue as outpatient. Plan for patient   Consults: None  Significant Diagnostic Studies: labs:  CMP     Component Value Date/Time   NA 136 04/09/2013 0516   K 3.1* 04/09/2013 0516   CL 101 04/09/2013 0516   CO2 22 04/09/2013 0516   GLUCOSE 107* 04/09/2013 0516   BUN 10 04/09/2013 0516   CREATININE 0.73 04/09/2013 0516   CALCIUM 7.6* 04/09/2013 0516   PROT 6.6 04/09/2013 0516   ALBUMIN 3.7 04/09/2013 0516   AST 16 04/09/2013 0516   ALT 7 04/09/2013 0516   ALKPHOS 83 04/09/2013 0516   BILITOT 0.1* 04/09/2013 0516   GFRNONAA >90 04/09/2013 0516   GFRAA >90 04/09/2013 0516    CBC    Component Value Date/Time   WBC 10.6* 04/09/2013 0516   RBC 3.80* 04/09/2013 0516   HGB 9.7* 04/09/2013 0516   HCT 30.3* 04/09/2013 0516   PLT 273 04/09/2013 0516   MCV 79.7 04/09/2013 0516   MCH 25.5* 04/09/2013 0516   MCHC 32.0 04/09/2013 0516   RDW 15.8* 04/09/2013 0516   LYMPHSABS 3.1 06/20/2011 0831   MONOABS 0.8 06/20/2011 0831   EOSABS 0.2 06/20/2011 0831   BASOSABS 0.0 06/20/2011 0831   Pr:cr 0.54   Treatments: IV hydration, analgesia: Dilaudid and Norvasc 10mg  improved BPs  Discharge Exam: BP 146/84  Pulse 92  Temp(Src) 97.8 F (36.6 C) (Axillary)  Resp 18  Ht 5\' 8"   (1.727 m)  Wt 74.753 kg (164 lb 12.8 oz)  BMI 25.06 kg/m2  SpO2 99%  General Appearance:    Alert, cooperative, no distress, appears stated age  Head:    Normocephalic, without obvious abnormality, atraumatic           Throat:   Lips, mucosa, and tongue normal; teeth and gums normal  Neck:   Supple, symmetrical, trachea midline,      Lungs:     Clear to auscultation bilaterally, respirations unlabored      Heart:    Regular rate and rhythm, S1 and S2 normal, no murmur, rub   or gallop     Abdomen:     Soft, non-tender, bowel sounds active all four quadrants,    no masses, no organomegaly        Extremities:   Extremities normal, atraumatic, no cyanosis or edema  Pulses:   2+ and symmetric all extremities  Skin:   Skin color, texture, turgor normal, no rashes or lesions          Disposition: 01-Home or Self Care  Patient Instructions:    Medication List    ASK your doctor about these medications       acetaminophen 500 MG tablet  Commonly known as:  TYLENOL  Take 500 mg by mouth every 6 (six) hours as needed for pain.  amLODipine 10 MG tablet  Commonly known as:  NORVASC  Take 1 tablet (10 mg total) by mouth daily.     FLUoxetine 20 MG capsule  Commonly known as:  PROZAC  Take 1 capsule (20 mg total) by mouth daily.     ibuprofen 600 MG tablet  Commonly known as:  ADVIL,MOTRIN  Take 1 tablet (600 mg total) by mouth every 6 (six) hours.     prenatal multivitamin Tabs tablet  Take 1 tablet by mouth daily at 12 noon.       Activity: activity as tolerated and no driving for today Diet: regular diet Wound Care: none needed  Follow-up with [facility] clinic  in 2 weeks. For blood pressure check. Establish new PCM for continued care. Follow up with your mental health appt for continued tx of anxiety and depression  Will discharge with prozac and percocet 5/325 #10 rf0 to help with pain control for current headache as pt has been requiring IV  dilaudid. Encouraged pt to trial excedrin Migraine prior to Percocets..  SignedTawana Scale 04/09/2013 2:48 PM

## 2013-04-09 NOTE — Progress Notes (Signed)
Paged to patient for her anxiety and depressed feelings.  Patient is concerned about her relationship with the father of her recent baby (appr. 6 weeks per mother) and apparently the father of her previous baby (13 months per mother).  She also has an older 42+ year old.  All are boys.  The oldest baby is from a different father an lives with him (per mother).  Chaplain spent time listening to her story. Patient seemed very drowsy, and her conversation was ordered and sequential.  Chaplain suggested and intervention for her to make a running list of 3-4 things she is thankful for each day, and refer to it in down periods.  Chaplain went through some of her positives with her in their conversation.  Patient stated she was grateful for "coming to listen to [her] problems."  Rema Jasmine, Chaplain Pager: [phone]

## 2013-04-09 NOTE — Progress Notes (Signed)
UR chart review completed.  

## 2013-04-09 NOTE — Progress Notes (Signed)
Subjective: Patient reports severe headache still requiring Dilaudid 2 mg po q3h. Denies epigastric/RUQ pain, visual symptoms. Of note, RN reports that patient talks on the phone comfortably while reporting severe pain.  She also has a history of opioid dependence and was dismissed from [facility] for falsifying opioid prescriptions.  Objective: I have reviewed patient's vital signs, intake and output, medications, labs and radiology results. Temp:  [97.6 F (36.4 C)-98.4 F (36.9 C)] 98.4 F (36.9 C) (08/12 0404) Pulse Rate:  [80-105] 84 (08/12 0139) Resp:  [16-20] 18 (08/12 0404) BP: (123-177)/(61-118) 137/87 mmHg (08/12 0500) SpO2:  [98 %-100 %] 99 % (08/12 0404) Weight:  [164 lb (74.39 kg)-164 lb 12.8 oz (74.753 kg)] 164 lb 12.8 oz (74.753 kg) (08/12 0540)  General: alert and no distress Resp: clear to auscultation bilaterally Cardio: regular rate and rhythm GI: soft, non-tender; bowel sounds normal; no masses,  no organomegaly Extremities: extremities normal, atraumatic, no cyanosis or edema, 2+DTRs  Results for orders placed during the hospital encounter of 04/08/13 (from the past 24 hour(s))  URINALYSIS, ROUTINE W REFLEX MICROSCOPIC     Status: Abnormal   Collection Time    04/08/13  9:15 AM      Result Value Range   Color, Urine YELLOW  YELLOW   APPearance CLEAR  CLEAR   Specific Gravity, Urine >1.030 (*) 1.005 - 1.030   pH 6.0  5.0 - 8.0   Glucose, UA NEGATIVE  NEGATIVE mg/dL   Hgb urine dipstick NEGATIVE  NEGATIVE   Bilirubin Urine NEGATIVE  NEGATIVE   Ketones, ur NEGATIVE  NEGATIVE mg/dL   Protein, ur 161 (*) NEGATIVE mg/dL   Urobilinogen, UA 0.2  0.0 - 1.0 mg/dL   Nitrite NEGATIVE  NEGATIVE   Leukocytes, UA NEGATIVE  NEGATIVE  PROTEIN / CREATININE RATIO, URINE     Status: Abnormal   Collection Time    04/08/13  9:15 AM      Result Value Range   Creatinine, Urine 234.79     Total Protein, Urine 127     PROTEIN CREATININE RATIO 0.54 (*) 0.00 - 0.15  URINE  MICROSCOPIC-ADD ON     Status: None   Collection Time    04/08/13  9:15 AM      Result Value Range   Squamous Epithelial / LPF RARE  RARE   WBC, UA 0-2  <3 WBC/hpf   Bacteria, UA RARE  RARE  URINE RAPID DRUG SCREEN (HOSP PERFORMED)     Status: Abnormal   Collection Time    04/08/13  9:15 AM      Result Value Range   Opiates NONE DETECTED  NONE DETECTED   Cocaine NONE DETECTED  NONE DETECTED   Benzodiazepines POSITIVE (*) NONE DETECTED   Amphetamines NONE DETECTED  NONE DETECTED   Tetrahydrocannabinol NONE DETECTED  NONE DETECTED   Barbiturates NONE DETECTED  NONE DETECTED  CBC     Status: Abnormal   Collection Time    04/08/13 10:05 AM      Result Value Range   WBC 10.1  4.0 - 10.5 K/uL   RBC 4.33  3.87 - 5.11 MIL/uL   Hemoglobin 11.1 (*) 12.0 - 15.0 g/dL   HCT 09.6  04.5 - 40.9 %   MCV 101.2 (*) 78.0 - 100.0 fL   MCH 25.6 (*) 26.0 - 34.0 pg   MCHC 25.3 (*) 30.0 - 36.0 g/dL   RDW 81.1 (*) 91.4 - 78.2 %   Platelets 291  150 - 400  K/uL  COMPREHENSIVE METABOLIC PANEL     Status: Abnormal   Collection Time    04/08/13 10:05 AM      Result Value Range   Sodium 138  135 - 145 mEq/L   Potassium 3.7  3.5 - 5.1 mEq/L   Chloride 105  96 - 112 mEq/L   CO2 20  19 - 32 mEq/L   Glucose, Bld 101 (*) 70 - 99 mg/dL   BUN 14  6 - 23 mg/dL   Creatinine, Ser 1.61  0.50 - 1.10 mg/dL   Calcium 9.5  8.4 - 09.6 mg/dL   Total Protein 7.6  6.0 - 8.3 g/dL   Albumin 4.5  3.5 - 5.2 g/dL   AST 9  0 - 37 U/L   ALT 6  0 - 35 U/L   Alkaline Phosphatase 71  39 - 117 U/L   Total Bilirubin 0.3  0.3 - 1.2 mg/dL   GFR calc non Af Amer 79 (*) >90 mL/min   GFR calc Af Amer >90  >90 mL/min  MRSA PCR SCREENING     Status: None   Collection Time    04/08/13 12:15 PM      Result Value Range   MRSA by PCR NEGATIVE  NEGATIVE  CBC     Status: Abnormal   Collection Time    04/09/13  5:16 AM      Result Value Range   WBC 10.6 (*) 4.0 - 10.5 K/uL   RBC 3.80 (*) 3.87 - 5.11 MIL/uL   Hemoglobin 9.7 (*) 12.0  - 15.0 g/dL   HCT 04.5 (*) 40.9 - 81.1 %   MCV 79.7  78.0 - 100.0 fL   MCH 25.5 (*) 26.0 - 34.0 pg   MCHC 32.0  30.0 - 36.0 g/dL   RDW 91.4 (*) 78.2 - 95.6 %   Platelets 273  150 - 400 K/uL  COMPREHENSIVE METABOLIC PANEL     Status: Abnormal   Collection Time    04/09/13  5:16 AM      Result Value Range   Sodium 136  135 - 145 mEq/L   Potassium 3.1 (*) 3.5 - 5.1 mEq/L   Chloride 101  96 - 112 mEq/L   CO2 22  19 - 32 mEq/L   Glucose, Bld 107 (*) 70 - 99 mg/dL   BUN 10  6 - 23 mg/dL   Creatinine, Ser 2.13  0.50 - 1.10 mg/dL   Calcium 7.6 (*) 8.4 - 10.5 mg/dL   Total Protein 6.6  6.0 - 8.3 g/dL   Albumin 3.7  3.5 - 5.2 g/dL   AST 16  0 - 37 U/L   ALT 7  0 - 35 U/L   Alkaline Phosphatase 83  39 - 117 U/L   Total Bilirubin 0.1 (*) 0.3 - 1.2 mg/dL   GFR calc non Af Amer >90  >90 mL/min   GFR calc Af Amer >90  >90 mL/min     04/08/2013    CT HEAD WITHOUT CONTRAST  Clinical Data: Severe headache, elevated blood pressure, 7 weeks postpartum Comparison: 04/28/2009  Findings: The normal sulcation with no abnormal attenuation to suggest hemorrhage, extra-axial fluid, or vascular territory infarct.  No evidence of mass or hydrocephalus.  Calvarium is intact.  IMPRESSION: Negative study   Original Report Authenticated By: Esperanza Heir, M.D.    Assessment/Plan: HD#2 after  Admission for pospartum preeclampsia.   Currently on magnesium sulfate, will continue until 1215 today Continue Norvasc 10 mg  and HCTZ  25 mg daily  Dilaudid prn pain; continue Xanax and Prozac. Monitor BP and symptoms after magnesium sulfate administration. If stable, she may be discharged to home later today.   LOS: 1 day   Tereso Newcomer, MD 04/09/2013, 6:07 AM

## 2013-04-09 NOTE — Progress Notes (Signed)
04/09/13 1200  Clinical Encounter Type  Visited With Patient  Visit Type Follow-up;Spiritual support;Social support  Referral From Chaplain Rema Jasmine)  Spiritual Encounters  Spiritual Needs Emotional;Grief support (losses: lifestyle, financial stability, rel with FOB?)  Stress Factors  Patient Stress Factors Major life changes;Loss of control;Financial concerns (per pt:  self-worth is suffering from lack of job, transport)  Family Stress Factors Loss;Major life changes (lives with grandfather, who is grieving; wife died 2012-10-03)   Followed up for emotional and spiritual support per referral from [employer].  Bryer was receptive to pastoral presence and shared about recent life transitions that have been very stressful and, per pt, have contributed to panic attacks.  She is staying with her grandfather, who is grieving loss of her grandmother in 10/03/2022, while pt herself is grieving change in her own relationship status (uncertain future with FOB; they no longer live together, and now she is missing housing/transport/financial stability).  Per pt, she is also very socially isolated, having lost friendships over the course of two long-term significant other relationships (10 and 4 years).  Per pt, in high school, softball and church were sources of friendship, community, self-esteem, and meaning making; she desires to return to church, which I encouraged (meaning, community, structure, social contacts-->friendships, child care and children's activities, parents' groups, pastoral support, etc).  Brilyn also reports good support and resources from her social worker, and she is looking forward to the counseling appointment she has on 8/18.  We talked about ways that counseling could be helpful, as well as free/discounted family-friendly activities/places in [place] as a means for her getting out of the house and meeting other people, two of her big stated needs.  I provided  pastoral listening, reflection, encouragement, [facility] referral resources for counseling and support groups for her grandfather, and a list of free recreational resources over two visits.  Pt appreciative and feeling more resolve/direction in building/taking advantages of a social network.  [address] [place], [place] [phone]

## 2013-04-09 NOTE — Progress Notes (Signed)
Patient given discharge instructions and prescription. Questions answered. Ambulated out with NT to meet grandfather at main entrance to pick her up.

## 2013-04-14 ENCOUNTER — Inpatient Hospital Stay (HOSPITAL_COMMUNITY)
Admission: AD | Admit: 2013-04-14 | Discharge: 2013-04-14 | Disposition: A | Discharge status: Home / Self Care | Payer: Medicaid Other | Source: Ambulatory Visit | Attending: Obstetrics & Gynecology | Admitting: Obstetrics & Gynecology

## 2013-04-14 ENCOUNTER — Encounter (HOSPITAL_COMMUNITY): Payer: Self-pay | Admitting: *Deleted

## 2013-04-14 DIAGNOSIS — O99345 Other mental disorders complicating the puerperium: Secondary | ICD-10-CM | POA: Insufficient documentation

## 2013-04-14 DIAGNOSIS — O99893 Other specified diseases and conditions complicating puerperium: Secondary | ICD-10-CM | POA: Insufficient documentation

## 2013-04-14 DIAGNOSIS — R51 Headache: Secondary | ICD-10-CM

## 2013-04-14 DIAGNOSIS — F410 Panic disorder [episodic paroxysmal anxiety] without agoraphobia: Secondary | ICD-10-CM

## 2013-04-14 DIAGNOSIS — N10 Acute tubulo-interstitial nephritis: Secondary | ICD-10-CM

## 2013-04-14 DIAGNOSIS — K769 Liver disease, unspecified: Secondary | ICD-10-CM | POA: Insufficient documentation

## 2013-04-14 LAB — CBC WITH DIFFERENTIAL/PLATELET
Basophils Absolute: 0 10*3/uL (ref 0.0–0.1)
HCT: 35.7 % — ABNORMAL LOW (ref 36.0–46.0)
Lymphocytes Relative: 12 % (ref 12–46)
Monocytes Absolute: 0.7 10*3/uL (ref 0.1–1.0)
Neutro Abs: 12.8 10*3/uL — ABNORMAL HIGH (ref 1.7–7.7)
Neutrophils Relative %: 83 % — ABNORMAL HIGH (ref 43–77)
Platelets: 350 10*3/uL (ref 150–400)
RDW: 16.8 % — ABNORMAL HIGH (ref 11.5–15.5)
WBC: 15.4 10*3/uL — ABNORMAL HIGH (ref 4.0–10.5)

## 2013-04-14 LAB — URINALYSIS, ROUTINE W REFLEX MICROSCOPIC
Bilirubin Urine: NEGATIVE
Hgb urine dipstick: NEGATIVE
Specific Gravity, Urine: 1.025 (ref 1.005–1.030)
Urobilinogen, UA: 0.2 mg/dL (ref 0.0–1.0)

## 2013-04-14 LAB — RAPID URINE DRUG SCREEN, HOSP PERFORMED
Amphetamines: NOT DETECTED
Benzodiazepines: NOT DETECTED
Opiates: POSITIVE — AB

## 2013-04-14 LAB — COMPREHENSIVE METABOLIC PANEL
ALT: 19 U/L (ref 0–35)
AST: 21 U/L (ref 0–37)
Alkaline Phosphatase: 72 U/L (ref 39–117)
CO2: 23 mEq/L (ref 19–32)
Chloride: 97 mEq/L (ref 96–112)
GFR calc non Af Amer: 53 mL/min — ABNORMAL LOW (ref >90)
Potassium: 3.3 mEq/L — ABNORMAL LOW (ref 3.5–5.1)
Sodium: 136 mEq/L (ref 135–145)
Total Bilirubin: 0.1 mg/dL — ABNORMAL LOW (ref 0.3–1.2)

## 2013-04-14 LAB — URINE MICROSCOPIC-ADD ON

## 2013-04-14 MED ORDER — HYDROMORPHONE HCL 2 MG PO TABS
2.0000 mg | ORAL_TABLET | Freq: Once | ORAL | Status: AC
  Administered 2013-04-14: 2 mg via ORAL
  Filled 2013-04-14: qty 1

## 2013-04-14 MED ORDER — DIPHENHYDRAMINE HCL 50 MG/ML IJ SOLN: 25.0000 mg | Freq: Once | INTRAMUSCULAR | Status: DC

## 2013-04-14 MED ORDER — BUTALBITAL-APAP-CAFFEINE 50-325-40 MG PO TABS: 1.0000 | ORAL_TABLET | Freq: Two times a day (BID) | ORAL | Status: AC | PRN

## 2013-04-14 MED ORDER — METOCLOPRAMIDE HCL 5 MG/ML IJ SOLN: 10.0000 mg | Freq: Once | INTRAMUSCULAR | Status: DC

## 2013-04-14 MED ORDER — SULFAMETHOXAZOLE-TMP DS 800-160 MG PO TABS: 1.0000 | ORAL_TABLET | Freq: Two times a day (BID) | ORAL | Status: DC

## 2013-04-14 MED ORDER — SULFAMETHOXAZOLE-TMP DS 800-160 MG PO TABS
1.0000 | ORAL_TABLET | Freq: Once | ORAL | Status: AC
  Administered 2013-04-14: 1 via ORAL
  Filled 2013-04-14: qty 1

## 2013-04-14 MED ORDER — BUTALBITAL-APAP-CAFFEINE 50-325-40 MG PO TABS: 1.0000 | ORAL_TABLET | Freq: Two times a day (BID) | ORAL | Status: DC | PRN

## 2013-04-14 MED ORDER — DEXAMETHASONE SODIUM PHOSPHATE 10 MG/ML IJ SOLN
10.0000 mg | Freq: Once | INTRAMUSCULAR | Status: DC
Start: 2013-04-14 — End: 2013-04-14

## 2013-04-14 MED ORDER — BUTALBITAL-APAP-CAFFEINE 50-325-40 MG PO TABS
1.0000 | ORAL_TABLET | Freq: Once | ORAL | Status: DC
  Filled 2013-04-14: qty 1

## 2013-04-14 MED ORDER — LACTATED RINGERS IV SOLN
Freq: Once | INTRAVENOUS | Status: AC
  Administered 2013-04-14: 12:00:00 via INTRAVENOUS

## 2013-04-14 NOTE — MAU Note (Signed)
Pt is post partum 03/03/13. Was Hospitalized last week for [facility] went  Home on Tuesday. On HCTZ for b/p. B/P still elevated 160's/100's. C/O severe H/a and nausea feels shakey and tingling feeling all over.

## 2013-04-14 NOTE — MAU Provider Note (Signed)
Client did not go to [facility] ER as charge nurse advised she could not be fully evaluated there as she had her baby and no other caregiver to take care of the baby.  House coverage called and aware of the situation. 1710  - client requesting to go home via cab - no one able to come to get her.  She will not be able to get prescription for pain medication filled today and requests one pain pill before she leaves via cab.  One pill ordered.

## 2013-04-14 NOTE — MAU Provider Note (Signed)
Attestation of Attending Supervision of Advanced Practitioner (CNM/NP): Evaluation and management procedures were performed by the Advanced Practitioner under my supervision and collaboration. I have reviewed the Advanced Practitioner's note and chart, and I agree with the management and plan.  Maziah Smola H. 3:04 PM   

## 2013-04-14 NOTE — MAU Note (Signed)
Pt requesting to go home states she can't find anyone to watch her baby to go and be evaluated at [facility].

## 2013-04-14 NOTE — MAU Provider Note (Addendum)
History     CSN: [id number]  Arrival date and time: 04/14/13 4098   First Provider Initiated Contact with Patient 04/14/13 1056      Chief Complaint  Patient presents with  . Hypertension   HPI Rebecca [name]  32 y.o.  Client is 6 weeks postpartum (delivered on 03-03-13).  Comes to MAU today via EMS with a severe headache and high blood pressure.  She has been checking her blood pressure at home and it has been high.  She has had a constant headache (8-9/10) and reports taking four 200 mg tablets of ibuprofen at a time and took them 4-5 times yesterday with 2 Tylenol 500 mg tablets every 4-6 hours.  No relief from her headache with medications and now she is nauseated from taking all the ibuprofen.  Client is tearful and nauseated. States she has not been able to sleep despite taking Benadryl and Unisom - that only made her more shaky.  Had Zofran IM in ambulance on way to MAU.  States she did not sleep last night. Once yesterday she thought she had a fever and took her temperature - it was 101 degrees.  States she is taking her blood pressure medication and her Prozac.  She is feeling jittery, shaky, and tingling all over.  Reports having a panic attack yesterday.  Additionally her partner has left saying he did not think the baby was his.  Client was admitted on Aug 11 and discharged on Aug 12 for high blood pressure and headache.  Had Magnesium sulfate in ICU and negative CT scan of her head.  OB History   Grav Para Term Preterm Abortions TAB SAB Ect Mult Living   3 3 3  0 0 0 0 0 0 3      Past Medical History  Diagnosis Date  . MRSA (methicillin resistant staph aureus) culture positive   . Anxiety     currently no meds  . Pregnancy induced hypertension     first preg  . Drug abuse, opioid type     released from [facility] for falsifying prescriptions  . Pregnant   . Kidney stone   . Kidney stone   . Mental disorder 04/2011    states was advised for detox but couldnt due to 1st  pregnancy    Past Surgical History  Procedure Laterality Date  . Wisdom tooth extraction  1995    Family History  Problem Relation Age of Onset  . Hypertension Mother     History  Substance Use Topics  . Smoking status: Current Every Day Smoker -- 0.50 packs/day for 12 years    Types: Cigarettes  . Smokeless tobacco: Never Used  . Alcohol Use: No    Allergies:  Allergies  Allergen Reactions  . Citalopram Hydrobromide Other (See Comments)    dizzINESS, causes panic attacks  . Propoxyphene-Acetaminophen Other (See Comments)    anxiety attacks  . Tramadol Palpitations    Prescriptions prior to admission  Medication Sig Dispense Refill  . acetaminophen (TYLENOL) 500 MG tablet Take 500 mg by mouth every 6 (six) hours as needed for pain.      Marland Kitchen amLODipine (NORVASC) 10 MG tablet Take 1 tablet (10 mg total) by mouth daily.  30 tablet  3  . amLODipine (NORVASC) 10 MG tablet Take 1 tablet (10 mg total) by mouth daily.  90 tablet  1  . FLUoxetine (PROZAC) 20 MG capsule Take 1 capsule (20 mg total) by mouth daily.  30  capsule  2  . FLUoxetine (PROZAC) 20 MG capsule Take 1 capsule (20 mg total) by mouth daily.  30 capsule  0  . hydrochlorothiazide (HYDRODIURIL) 25 MG tablet Take 1 tablet (25 mg total) by mouth daily.  90 tablet  1  . ibuprofen (ADVIL,MOTRIN) 600 MG tablet Take 1 tablet (600 mg total) by mouth every 6 (six) hours.  30 tablet  0  . oxyCODONE-acetaminophen (ROXICET) 5-325 MG per tablet Take 1 tablet by mouth every 4 (four) hours as needed for pain.  10 tablet  0    Review of Systems  Constitutional: Positive for fever.  Eyes: Negative for blurred vision.       Feels hot behind her eyes  Gastrointestinal: Positive for nausea, vomiting and abdominal pain. Negative for diarrhea and constipation.  Genitourinary: Negative for dysuria.  Neurological: Positive for tingling and headaches.       Jittery, shaky   Physical Exam   Blood pressure 154/98, pulse 88,  temperature 98.1 F (36.7 C), temperature source Oral, resp. rate 18, height 5\' 8"  (1.727 m), weight 163 lb 6.4 oz (74.118 kg).  Physical Exam  Nursing note and vitals reviewed. Constitutional: She is oriented to person, place, and time. She appears well-developed and well-nourished. She appears distressed.  Tearful  HENT:  Head: Normocephalic.  Eyes: EOM are normal.  Neck: Neck supple.  Musculoskeletal: Normal range of motion.  Neurological: She is alert and oriented to person, place, and time.  Skin: Skin is warm and dry.  Psychiatric: She has a normal mood and affect.    MAU Course  Procedures Results for orders placed during the hospital encounter of 04/14/13 (from the past 24 hour(s))  URINALYSIS, ROUTINE W REFLEX MICROSCOPIC     Status: Abnormal   Collection Time    04/14/13 10:20 AM      Result Value Range   Color, Urine YELLOW  YELLOW   APPearance HAZY (*) CLEAR   Specific Gravity, Urine 1.025  1.005 - 1.030   pH 5.5  5.0 - 8.0   Glucose, UA NEGATIVE  NEGATIVE mg/dL   Hgb urine dipstick NEGATIVE  NEGATIVE   Bilirubin Urine NEGATIVE  NEGATIVE   Ketones, ur NEGATIVE  NEGATIVE mg/dL   Protein, ur NEGATIVE  NEGATIVE mg/dL   Urobilinogen, UA 0.2  0.0 - 1.0 mg/dL   Nitrite NEGATIVE  NEGATIVE   Leukocytes, UA LARGE (*) NEGATIVE  URINE MICROSCOPIC-ADD ON     Status: Abnormal   Collection Time    04/14/13 10:20 AM      Result Value Range   Squamous Epithelial / LPF FEW (*) RARE   WBC, UA 11-20  <3 WBC/hpf   RBC / HPF 0-2  <3 RBC/hpf   Bacteria, UA MANY (*) RARE   Casts HYALINE CASTS (*) NEGATIVE  CBC WITH DIFFERENTIAL     Status: Abnormal   Collection Time    04/14/13 11:05 AM      Result Value Range   WBC 15.4 (*) 4.0 - 10.5 K/uL   RBC 4.46  3.87 - 5.11 MIL/uL   Hemoglobin 11.4 (*) 12.0 - 15.0 g/dL   HCT 16.1 (*) 09.6 - 04.5 %   MCV 80.0  78.0 - 100.0 fL   MCH 25.6 (*) 26.0 - 34.0 pg   MCHC 31.9  30.0 - 36.0 g/dL   RDW 40.9 (*) 81.1 - 91.4 %   Platelets 350   150 - 400 K/uL   Neutrophils Relative % 83 (*) 43 -  77 %   Neutro Abs 12.8 (*) 1.7 - 7.7 K/uL   Lymphocytes Relative 12  12 - 46 %   Lymphs Abs 1.8  0.7 - 4.0 K/uL   Monocytes Relative 4  3 - 12 %   Monocytes Absolute 0.7  0.1 - 1.0 K/uL   Eosinophils Relative 1  0 - 5 %   Eosinophils Absolute 0.1  0.0 - 0.7 K/uL   Basophils Relative 0  0 - 1 %   Basophils Absolute 0.0  0.0 - 0.1 K/uL  COMPREHENSIVE METABOLIC PANEL     Status: Abnormal   Collection Time    04/14/13 11:05 AM      Result Value Range   Sodium 136  135 - 145 mEq/L   Potassium 3.3 (*) 3.5 - 5.1 mEq/L   Chloride 97  96 - 112 mEq/L   CO2 23  19 - 32 mEq/L   Glucose, Bld 107 (*) 70 - 99 mg/dL   BUN 27 (*) 6 - 23 mg/dL   Creatinine, Ser 6.96 (*) 0.50 - 1.10 mg/dL   Calcium 9.7  8.4 - 29.5 mg/dL   Total Protein 7.8  6.0 - 8.3 g/dL   Albumin 4.4  3.5 - 5.2 g/dL   AST 21  0 - 37 U/L   ALT 19  0 - 35 U/L   Alkaline Phosphatase 72  39 - 117 U/L   Total Bilirubin 0.1 (*) 0.3 - 1.2 mg/dL   GFR calc non Af Amer 53 (*) >90 mL/min   GFR calc Af Amer 62 (*) >90 mL/min    MDM 1135  Will start IVF and give IV medication for headache.  (Regan, Benadryl, and dexamethasone).   1150  Client refuses IV Benadryl as she took it yesterday.  Continues to be tearful and wants the IV medication she had at the last visit (Dilaudid) for her headache. 1155 Consult with Dr. Penne Lash re: plan of care. 1220  Plan will be to transfer to [facility] for evaluation for liver failure based on labs today and she has her baby with her with no one else here.  Drug screen ordered.  Serial BPs have been normal after first elevated BP noted. 1235  Discussed clients labs and history with Dr. Ranae Palms who recommends treating for UTI and dehydration rather than transfer at this time.  Consult with Dr. Penne Lash who came to see the patient.  Assessment and Plan  Headache,  6 weeks postpartum Abnormal serum creatinine, BUN and GFR, possible renal failure  Plan  Dr.  Penne Lash in to see patient.  Tiffony Kite 04/14/2013, 11:00 AM   Pt seen and examined.  Advised for transfer to [facility].  Dr. Ranae Palms felt comfortable managing the patient over the phone.  Pt is now 6 weeks post partum and is unlikely that this is related to recent pregnancy.  --Treat for pyelo--bactrim bid for 10 days --Dilaudid po x1 for headache with fiorcet to go home --Will call [facility] for f/u to ensure that creatinine is normalizing. --Pt should go to [facility] ED if more problems with headache or urinary symptoms continue. --Pt offered case Production designer, theatre/television/film / SW due to social issues.  Pt refused.   [id number]  Client had a "panic attack" with continuous crying to the point she could not talk.  RN took the baby and changed the baby.  Attack eased.  Reviewed instructions with Dr. Penne Lash by phone.  Reviewed instructions again with client.  Clearly offered Transfer to [facility] ER  for panic attacks.  Advised if client's condition worsens, she should be seen at a hospital ER for medical concerns.  As she is no longer pregnant, [facility] is not appropriate for chronic medical and psychiatric problems.  [facility] or [facility] ER will be more appropriate places.  Emphasized the importance of picking up the Bactrim which was eprescribed to client's pharmacy.  Printed the Fioricet prescription here in MAU as it did not print from distant location when Dr. Penne Lash signed off the chart.  Client only has one Fioricet prescription and it was not eprescribed.     1435 Prescription printed and discharge instructions printed.  Client is requesting to be transferred to [facility] ER to help her with her "panic attacks".  States she still has a headache today and provider reminded patient that she has had appropriate treatment for her headache today and a prescription to use as needed at home.  Consult with Dr. Thurnell Lose at [facility] ER and will transfer via [facility] for additional evaluation.

## 2013-04-14 NOTE — MAU Note (Signed)
Discussed in length with Electa Sniff RN charge nurse at [facility] about pt being transferred for further evaluation. Pt does not have anyone to care for her 49 week old baby and Electa Sniff RN states that pt has to have someone to care for baby before pt can be transferred for care. Provider notified.

## 2013-04-15 LAB — URINE CULTURE

## 2013-04-15 NOTE — MAU Provider Note (Signed)
Attestation of Attending Supervision of Advanced Practitioner (CNM/NP): Evaluation and management procedures were performed by the Advanced Practitioner under my supervision and collaboration. I have reviewed the Advanced Practitioner's note and chart, and I agree with the management and plan.  Misha Antonini H. 8:32 AM   

## 2013-04-18 ENCOUNTER — Ambulatory Visit: Payer: Self-pay | Admitting: Obstetrics and Gynecology

## 2013-04-18 ENCOUNTER — Ambulatory Visit: Payer: Self-pay | Admitting: Nurse Practitioner

## 2013-05-01 ENCOUNTER — Encounter: Payer: Self-pay | Admitting: *Deleted

## 2014-02-20 ENCOUNTER — Encounter (HOSPITAL_BASED_OUTPATIENT_CLINIC_OR_DEPARTMENT_OTHER): Payer: Self-pay | Admitting: Emergency Medicine

## 2014-02-20 ENCOUNTER — Emergency Department (HOSPITAL_BASED_OUTPATIENT_CLINIC_OR_DEPARTMENT_OTHER)
Admission: EM | Admit: 2014-02-20 | Discharge: 2014-02-20 | Disposition: A | Discharge status: Home / Self Care | Payer: Medicaid Other | Attending: Emergency Medicine | Admitting: Emergency Medicine

## 2014-02-20 DIAGNOSIS — Z3202 Encounter for pregnancy test, result negative: Secondary | ICD-10-CM | POA: Insufficient documentation

## 2014-02-20 DIAGNOSIS — Z792 Long term (current) use of antibiotics: Secondary | ICD-10-CM | POA: Insufficient documentation

## 2014-02-20 DIAGNOSIS — Z79899 Other long term (current) drug therapy: Secondary | ICD-10-CM | POA: Insufficient documentation

## 2014-02-20 DIAGNOSIS — Z8614 Personal history of Methicillin resistant Staphylococcus aureus infection: Secondary | ICD-10-CM | POA: Insufficient documentation

## 2014-02-20 DIAGNOSIS — F411 Generalized anxiety disorder: Secondary | ICD-10-CM | POA: Insufficient documentation

## 2014-02-20 DIAGNOSIS — K2990 Gastroduodenitis, unspecified, without bleeding: Secondary | ICD-10-CM

## 2014-02-20 DIAGNOSIS — R109 Unspecified abdominal pain: Secondary | ICD-10-CM

## 2014-02-20 DIAGNOSIS — K2970 Gastritis, unspecified, without bleeding: Secondary | ICD-10-CM

## 2014-02-20 DIAGNOSIS — Z87442 Personal history of urinary calculi: Secondary | ICD-10-CM | POA: Insufficient documentation

## 2014-02-20 DIAGNOSIS — Z87891 Personal history of nicotine dependence: Secondary | ICD-10-CM | POA: Insufficient documentation

## 2014-02-20 DIAGNOSIS — K226 Gastro-esophageal laceration-hemorrhage syndrome: Secondary | ICD-10-CM

## 2014-02-20 LAB — COMPREHENSIVE METABOLIC PANEL
ALBUMIN: 4.6 g/dL (ref 3.5–5.2)
ALT: 18 U/L (ref 0–35)
AST: 18 U/L (ref 0–37)
Alkaline Phosphatase: 87 U/L (ref 39–117)
BUN: 9 mg/dL (ref 6–23)
CALCIUM: 9.8 mg/dL (ref 8.4–10.5)
CO2: 25 meq/L (ref 19–32)
Chloride: 102 mEq/L (ref 96–112)
Creatinine, Ser: 0.9 mg/dL (ref 0.50–1.10)
GFR calc Af Amer: >90 mL/min (ref >90)
GFR, EST NON AFRICAN AMERICAN: 82 mL/min — AB (ref >90)
Glucose, Bld: 107 mg/dL — ABNORMAL HIGH (ref 70–99)
Potassium: 4.2 mEq/L (ref 3.7–5.3)
SODIUM: 141 meq/L (ref 137–147)
Total Bilirubin: 0.3 mg/dL (ref 0.3–1.2)
Total Protein: 8.3 g/dL (ref 6.0–8.3)

## 2014-02-20 LAB — URINALYSIS, ROUTINE W REFLEX MICROSCOPIC
BILIRUBIN URINE: NEGATIVE
Glucose, UA: NEGATIVE mg/dL
Hgb urine dipstick: NEGATIVE
Ketones, ur: NEGATIVE mg/dL
LEUKOCYTES UA: NEGATIVE
NITRITE: NEGATIVE
Protein, ur: NEGATIVE mg/dL
SPECIFIC GRAVITY, URINE: 1.009 (ref 1.005–1.030)
UROBILINOGEN UA: 0.2 mg/dL (ref 0.0–1.0)
pH: 8.5 — ABNORMAL HIGH (ref 5.0–8.0)

## 2014-02-20 LAB — CBC WITH DIFFERENTIAL/PLATELET
BASOS ABS: 0 10*3/uL (ref 0.0–0.1)
BASOS PCT: 0 % (ref 0–1)
EOS PCT: 1 % (ref 0–5)
Eosinophils Absolute: 0.2 10*3/uL (ref 0.0–0.7)
HCT: 40 % (ref 36.0–46.0)
Hemoglobin: 13.2 g/dL (ref 12.0–15.0)
LYMPHS PCT: 22 % (ref 12–46)
Lymphs Abs: 3 10*3/uL (ref 0.7–4.0)
MCH: 29.2 pg (ref 26.0–34.0)
MCHC: 33 g/dL (ref 30.0–36.0)
MCV: 88.5 fL (ref 78.0–100.0)
Monocytes Absolute: 0.8 10*3/uL (ref 0.1–1.0)
Monocytes Relative: 6 % (ref 3–12)
Neutro Abs: 9.7 10*3/uL — ABNORMAL HIGH (ref 1.7–7.7)
Neutrophils Relative %: 71 % (ref 43–77)
PLATELETS: 235 10*3/uL (ref 150–400)
RBC: 4.52 MIL/uL (ref 3.87–5.11)
RDW: 14.7 % (ref 11.5–15.5)
WBC: 13.7 10*3/uL — AB (ref 4.0–10.5)

## 2014-02-20 LAB — PREGNANCY, URINE: Preg Test, Ur: NEGATIVE

## 2014-02-20 LAB — LIPASE, BLOOD: Lipase: 40 U/L (ref 11–59)

## 2014-02-20 MED ORDER — MORPHINE SULFATE 4 MG/ML IJ SOLN
4.0000 mg | Freq: Once | INTRAMUSCULAR | Status: AC
  Administered 2014-02-20: 4 mg via INTRAVENOUS
  Filled 2014-02-20: qty 1

## 2014-02-20 MED ORDER — SODIUM CHLORIDE 0.9 % IV BOLUS (SEPSIS)
1000.0000 mL | Freq: Once | INTRAVENOUS | Status: AC
  Administered 2014-02-20: 1000 mL via INTRAVENOUS

## 2014-02-20 MED ORDER — ONDANSETRON HCL 4 MG PO TABS: 4.0000 mg | ORAL_TABLET | Freq: Four times a day (QID) | ORAL | Status: DC

## 2014-02-20 MED ORDER — SUCRALFATE 1 GM/10ML PO SUSP: 1.0000 g | Freq: Three times a day (TID) | ORAL | Status: AC

## 2014-02-20 MED ORDER — FAMOTIDINE 20 MG PO TABS: 20.0000 mg | ORAL_TABLET | Freq: Two times a day (BID) | ORAL | Status: AC

## 2014-02-20 MED ORDER — ONDANSETRON HCL 4 MG/2ML IJ SOLN
4.0000 mg | Freq: Once | INTRAMUSCULAR | Status: AC
  Administered 2014-02-20: 4 mg via INTRAVENOUS
  Filled 2014-02-20: qty 2

## 2014-02-20 MED ORDER — GI COCKTAIL ~~LOC~~
30.0000 mL | Freq: Once | ORAL | Status: AC
  Administered 2014-02-20: 30 mL via ORAL
  Filled 2014-02-20: qty 30

## 2014-02-20 NOTE — ED Notes (Signed)
Pt is a resident of [facility]-states is a "transition house"-has been there since release from prison-was given a list of meds pt can not have with opoids, CNS depressants, stimulants

## 2014-02-20 NOTE — ED Provider Notes (Signed)
CSN: [id number]     Arrival date & time 02/20/14  1054 History   First MD Initiated Contact with Patient 02/20/14 1134     Chief Complaint  Patient presents with  . Abdominal Pain     (Consider location/radiation/quality/duration/timing/severity/associated sxs/prior Treatment) HPI Comments: Patient presents to the ER for evaluation of left-sided abdominal and flank pain. Patient reports that the symptoms began overnight she has had increasing pain, nausea and vomiting since the symptoms began. She tried ibuprofen for the pain, but it did not help. It gave her "reflux symptoms". Patient reports that the last time she vomited there was small strings of blood in the vomit. She has not had any black or tarry stools. Patient reports that the pain feels like when she had a kidney stone.  Patient is a 33 y.o. female presenting with abdominal pain.  Abdominal Pain Associated symptoms: nausea and vomiting     Past Medical History  Diagnosis Date  . MRSA (methicillin resistant staph aureus) culture positive   . Anxiety     currently no meds  . Pregnancy induced hypertension     first preg  . Drug abuse, opioid type     [facility]  . Pregnant   . Kidney stone   . Kidney stone   . Mental disorder 04/2011    states was advised for detox but couldnt due to 1st pregnancy   Past Surgical History  Procedure Laterality Date  . Wisdom tooth extraction  1995   Family History  Problem Relation Age of Onset  . Hypertension Mother    History  Substance Use Topics  . Smoking status: Former Smoker -- 0.50 packs/day for 12 years  . Smokeless tobacco: Never Used  . Alcohol Use: No   OB History   Grav Para Term Preterm Abortions TAB SAB Ect Mult Living   3 3 3  0 0 0 0 0 0 3     Review of Systems  Gastrointestinal: Positive for nausea, vomiting and abdominal pain.  All other systems reviewed and are negative.     Allergies  Citalopram  hydrobromide; Propoxyphene n-acetaminophen; and Tramadol  Home Medications   Prior to Admission medications   Medication Sig Start Date End Date Taking? Authorizing Provider  amLODipine (NORVASC) 10 MG tablet Take 1 tablet (10 mg total) by mouth daily. 03/05/13   Tawni CarnesAndrew Wight, MD  butalbital-acetaminophen-caffeine (FIORICET, ESGIC) [phone] MG per tablet Take 1 tablet by mouth 2 (two) times daily as needed for headache. 04/14/13   Currie Pariserri L Burleson, NP  famotidine (PEPCID) 20 MG tablet Take 1 tablet (20 mg total) by mouth 2 (two) times daily. 02/20/14   Gilda Creasehristopher J. Pollina, MD  FLUoxetine (PROZAC) 20 MG capsule Take 1 capsule (20 mg total) by mouth daily. 03/28/13   Adam PhenixJames G Arnold, MD  hydrochlorothiazide (HYDRODIURIL) 25 MG tablet Take 1 tablet (25 mg total) by mouth daily. 04/09/13   Minta BalsamMichael R Odom, MD  ibuprofen (ADVIL,MOTRIN) 600 MG tablet Take 1 tablet (600 mg total) by mouth every 6 (six) hours. 03/05/13   Tawni CarnesAndrew Wight, MD  ondansetron (ZOFRAN) 4 MG tablet Take 1 tablet (4 mg total) by mouth every 6 (six) hours. 02/20/14   Gilda Creasehristopher J. Pollina, MD  oxyCODONE-acetaminophen (ROXICET) 5-325 MG per tablet Take 1 tablet by mouth every 4 (four) hours as needed for pain. 04/09/13   Minta BalsamMichael R Odom, MD  sucralfate (CARAFATE) 1 GM/10ML suspension Take 10 mLs (1 g total) by mouth 4 (  four) times daily -  with meals and at bedtime. 02/20/14   Gilda Creasehristopher J. Pollina, MD  sulfamethoxazole-trimethoprim (BACTRIM DS) 800-160 MG per tablet Take 1 tablet by mouth 2 (two) times daily. 04/14/13   Lesly DukesKelly H Leggett, MD   BP 131/94  Pulse 94  Temp(Src) 98.3 F (36.8 C) (Oral)  Resp 16  Ht 5\' 8"  (1.727 m)  Wt 163 lb (73.936 kg)  BMI 24.79 kg/m2  SpO2 99%  LMP 01/26/2014 Physical Exam  Constitutional: She is oriented to person, place, and time. She appears well-developed and well-nourished. No distress.  HENT:  Head: Normocephalic and atraumatic.  Right Ear: Hearing normal.  Left Ear: Hearing normal.  Nose: Nose  normal.  Mouth/Throat: Oropharynx is clear and moist and mucous membranes are normal.  Eyes: Conjunctivae and EOM are normal. Pupils are equal, round, and reactive to light.  Neck: Normal range of motion. Neck supple.  Cardiovascular: Regular rhythm, S1 normal and S2 normal.  Exam reveals no gallop and no friction rub.   No murmur heard. Pulmonary/Chest: Effort normal and breath sounds normal. No respiratory distress. She exhibits no tenderness.  Abdominal: Soft. Normal appearance and bowel sounds are normal. There is no hepatosplenomegaly. There is no tenderness. There is no rebound, no guarding, no tenderness at McBurney's point and negative Murphy's sign. No hernia.  Musculoskeletal: Normal range of motion.  Neurological: She is alert and oriented to person, place, and time. She has normal strength. No cranial nerve deficit or sensory deficit. Coordination normal. GCS eye subscore is 4. GCS verbal subscore is 5. GCS motor subscore is 6.  Skin: Skin is warm, dry and intact. No rash noted. No cyanosis.  Psychiatric: She has a normal mood and affect. Her speech is normal and behavior is normal. Thought content normal.    ED Course  Procedures (including critical care time) Labs Review Labs Reviewed  URINALYSIS, ROUTINE W REFLEX MICROSCOPIC - Abnormal; Notable for the following:    pH 8.5 (*)    All other components within normal limits  CBC WITH DIFFERENTIAL - Abnormal; Notable for the following:    WBC 13.7 (*)    Neutro Abs 9.7 (*)    All other components within normal limits  COMPREHENSIVE METABOLIC PANEL - Abnormal; Notable for the following:    Glucose, Bld 107 (*)    GFR calc non Af Amer 82 (*)    All other components within normal limits  PREGNANCY, URINE  LIPASE, BLOOD    Imaging Review No results found.   EKG Interpretation None      MDM   Final diagnoses:  Abdominal pain in female   Patient presents to the ER for evaluation of left-sided abdominal pain with  nausea and vomiting. She says it feels like when she had a kidney stone, but her urinalysis is unremarkable. Patient's abdominal exam was benign as well. Patient does report that she had blood in her vomit, but this seems consistent with gastric mucosa irritation or possibly Mallory-Weiss tear. She vomited multiple times before she saw small amounts of streaks of blood in the vomit. She has a stable hemoglobin.  Patient currently under treatment for drug abuse. She cannot have any narcotic analgesia medications as a discharge prescription. Patient given a small dose of morphine here in the ER to help complete the workup. She was given IV fluids. Patient will be discharged with Carafate, Pepcid and Zofran for symptomatic treatment of gastritis with nausea and vomiting.    Gilda Creasehristopher J. Pollina, MD  02/21/14 0659 

## 2014-02-20 NOTE — Discharge Instructions (Signed)

## 2014-02-20 NOTE — ED Notes (Signed)
Left side abd pain/flank pain-states feels like kidney stone pain

## 2014-06-30 ENCOUNTER — Encounter (HOSPITAL_BASED_OUTPATIENT_CLINIC_OR_DEPARTMENT_OTHER): Payer: Self-pay | Admitting: Emergency Medicine

## 2014-11-20 ENCOUNTER — Encounter (HOSPITAL_BASED_OUTPATIENT_CLINIC_OR_DEPARTMENT_OTHER): Payer: Self-pay | Admitting: *Deleted

## 2014-11-20 ENCOUNTER — Emergency Department (HOSPITAL_BASED_OUTPATIENT_CLINIC_OR_DEPARTMENT_OTHER)
Admission: EM | Admit: 2014-11-20 | Discharge: 2014-11-20 | Disposition: A | Discharge status: Home / Self Care | Payer: Medicaid Other | Attending: Emergency Medicine | Admitting: Emergency Medicine

## 2014-11-20 ENCOUNTER — Emergency Department (HOSPITAL_BASED_OUTPATIENT_CLINIC_OR_DEPARTMENT_OTHER): Payer: Medicaid Other

## 2014-11-20 DIAGNOSIS — Z8614 Personal history of Methicillin resistant Staphylococcus aureus infection: Secondary | ICD-10-CM | POA: Insufficient documentation

## 2014-11-20 DIAGNOSIS — J159 Unspecified bacterial pneumonia: Secondary | ICD-10-CM | POA: Insufficient documentation

## 2014-11-20 DIAGNOSIS — Z87891 Personal history of nicotine dependence: Secondary | ICD-10-CM | POA: Insufficient documentation

## 2014-11-20 DIAGNOSIS — F419 Anxiety disorder, unspecified: Secondary | ICD-10-CM | POA: Insufficient documentation

## 2014-11-20 DIAGNOSIS — J189 Pneumonia, unspecified organism: Secondary | ICD-10-CM

## 2014-11-20 DIAGNOSIS — Z792 Long term (current) use of antibiotics: Secondary | ICD-10-CM | POA: Insufficient documentation

## 2014-11-20 DIAGNOSIS — Z87442 Personal history of urinary calculi: Secondary | ICD-10-CM | POA: Insufficient documentation

## 2014-11-20 DIAGNOSIS — Z79899 Other long term (current) drug therapy: Secondary | ICD-10-CM | POA: Insufficient documentation

## 2014-11-20 MED ORDER — BENZONATATE 100 MG PO CAPS: 100.0000 mg | ORAL_CAPSULE | Freq: Three times a day (TID) | ORAL | Status: DC

## 2014-11-20 MED ORDER — AZITHROMYCIN 250 MG PO TABS: ORAL_TABLET | ORAL | Status: DC

## 2014-11-20 NOTE — Discharge Instructions (Signed)
Zithromax as prescribed.  Ibuprofen 600 mg every six hours as needed for pain.  Tessalon as prescribed as needed for cough.   Pneumonia Pneumonia is an infection of the lungs.  CAUSES Pneumonia may be caused by bacteria or a virus. Usually, these infections are caused by breathing infectious particles into the lungs (respiratory tract). SIGNS AND SYMPTOMS   Cough.  Fever.  Chest pain.  Increased rate of breathing.  Wheezing.  Mucus production. DIAGNOSIS  If you have the common symptoms of pneumonia, your health care provider will typically confirm the diagnosis with a chest X-ray. The X-ray will show an abnormality in the lung (pulmonary infiltrate) if you have pneumonia. Other tests of your blood, urine, or sputum may be done to find the specific cause of your pneumonia. Your health care provider may also do tests (blood gases or pulse oximetry) to see how well your lungs are working. TREATMENT  Some forms of pneumonia may be spread to other people when you cough or sneeze. You may be asked to wear a mask before and during your exam. Pneumonia that is caused by bacteria is treated with antibiotic medicine. Pneumonia that is caused by the influenza virus may be treated with an antiviral medicine. Most other viral infections must run their course. These infections will not respond to antibiotics.  HOME CARE INSTRUCTIONS   Cough suppressants may be used if you are losing too much rest. However, coughing protects you by clearing your lungs. You should avoid using cough suppressants if you can.  Your health care provider may have prescribed medicine if he or she thinks your pneumonia is caused by bacteria or influenza. Finish your medicine even if you start to feel better.  Your health care provider may also prescribe an expectorant. This loosens the mucus to be coughed up.  Take medicines only as directed by your health care provider.  Do not smoke. Smoking is a common cause of  bronchitis and can contribute to pneumonia. If you are a smoker and continue to smoke, your cough may last several weeks after your pneumonia has cleared.  A cold steam vaporizer or humidifier in your room or home may help loosen mucus.  Coughing is often worse at night. Sleeping in a semi-upright position in a recliner or using a couple pillows under your head will help with this.  Get rest as you feel it is needed. Your body will usually let you know when you need to rest. PREVENTION A pneumococcal shot (vaccine) is available to prevent a common bacterial cause of pneumonia. This is usually suggested for:  People over 35 years old.  Patients on chemotherapy.  People with chronic lung problems, such as bronchitis or emphysema.  People with immune system problems. If you are over 65 or have a high risk condition, you may receive the pneumococcal vaccine if you have not received it before. In some countries, a routine influenza vaccine is also recommended. This vaccine can help prevent some cases of pneumonia.You may be offered the influenza vaccine as part of your care. If you smoke, it is time to quit. You may receive instructions on how to stop smoking. Your health care provider can provide medicines and counseling to help you quit. SEEK MEDICAL CARE IF: You have a fever. SEEK IMMEDIATE MEDICAL CARE IF:   Your illness becomes worse. This is especially true if you are elderly or weakened from any other disease.  You cannot control your cough with suppressants and are losing sleep.  You begin coughing up blood.  You develop pain which is getting worse or is uncontrolled with medicines.  Any of the symptoms which initially brought you in for treatment are getting worse rather than better.  You develop shortness of breath or chest pain. MAKE SURE YOU:   Understand these instructions.  Will watch your condition.  Will get help right away if you are not doing well or get  worse. Document Released: 08/15/2005 Document Revised: 12/30/2013 Document Reviewed: 11/04/2010 [facility] Patient Information 2015 [place], [place]. This information is not intended to replace advice given to you by your health care provider. Make sure you discuss any questions you have with your health care provider.

## 2014-11-20 NOTE — ED Notes (Signed)
Cough for a month. To ED via EMS due to no other transportation. Mask on arrival.

## 2014-11-20 NOTE — ED Provider Notes (Signed)
CSN: [id number]     Arrival date & time 11/20/14  1141 History   First MD Initiated Contact with Patient 11/20/14 1245     Chief Complaint  Patient presents with  . Cough     (Consider location/radiation/quality/duration/timing/severity/associated sxs/prior Treatment) HPI Comments: Patient is a 34 year old female with history of anxiety. She presents today with complaints of cough that she states has been worsening for several weeks. She has had several ill children at home. She denies any fevers or chills. She denies shortness of breath.  Patient is a 34 y.o. female presenting with cough. The history is provided by the patient.  Cough Cough characteristics:  Non-productive Severity:  Moderate Onset quality:  Gradual Duration:  4 weeks Timing:  Constant Progression:  Worsening Chronicity:  New Smoker: yes   Relieved by:  Nothing Worsened by:  Nothing tried   Past Medical History  Diagnosis Date  . MRSA (methicillin resistant staph aureus) culture positive   . Anxiety     currently no meds  . Pregnancy induced hypertension     first preg  . Drug abuse, opioid type     released from [facility] for falsifying prescriptions  . Pregnant   . Kidney stone   . Kidney stone   . Mental disorder 04/2011    states was advised for detox but couldnt due to 1st pregnancy   Past Surgical History  Procedure Laterality Date  . Wisdom tooth extraction  1995   Family History  Problem Relation Age of Onset  . Hypertension Mother    History  Substance Use Topics  . Smoking status: Former Smoker -- 0.00 packs/day for 12 years  . Smokeless tobacco: Never Used  . Alcohol Use: No   OB History    Gravida Para Term Preterm AB TAB SAB Ectopic Multiple Living   0 0 0 0 0 0 3     Review of Systems  Respiratory: Positive for cough.   All other systems reviewed and are negative.     Allergies  Citalopram hydrobromide; Propoxyphene n-acetaminophen; and Tramadol  Home  Medications   Prior to Admission medications   Medication Sig Start Date End Date Taking? Authorizing Provider  amLODipine (NORVASC) 10 MG tablet Take 1 tablet (10 mg total) by mouth daily. 03/05/13   Nani Ravens, MD  butalbital-acetaminophen-caffeine (FIORICET, ESGIC) [phone] MG per tablet Take 1 tablet by mouth 2 (two) times daily as needed for headache. 04/14/13   Currie Paris, NP  famotidine (PEPCID) 20 MG tablet Take 1 tablet (20 mg total) by mouth 2 (two) times daily. 02/20/14   Gilda Crease, MD  FLUoxetine (PROZAC) 20 MG capsule Take 1 capsule (20 mg total) by mouth daily. 03/28/13   Adam Phenix, MD  hydrochlorothiazide (HYDRODIURIL) 25 MG tablet Take 1 tablet (25 mg total) by mouth daily. 04/09/13   Minta Balsam, MD  ibuprofen (ADVIL,MOTRIN) 600 MG tablet Take 1 tablet (600 mg total) by mouth every 6 (six) hours. 03/05/13   Nani Ravens, MD  ondansetron (ZOFRAN) 4 MG tablet Take 1 tablet (4 mg total) by mouth every 6 (six) hours. 02/20/14   Gilda Crease, MD  oxyCODONE-acetaminophen (ROXICET) 5-325 MG per tablet Take 1 tablet by mouth every 4 (four) hours as needed for pain. 04/09/13   Minta Balsam, MD  sucralfate (CARAFATE) 1 GM/10ML suspension Take 10 mLs (1 g total) by mouth 4 (four) times daily -  with meals and at  bedtime. 02/20/14   Gilda Creasehristopher J Pollina, MD  sulfamethoxazole-trimethoprim (BACTRIM DS) 800-160 MG per tablet Take 1 tablet by mouth 2 (two) times daily. 04/14/13   Lesly DukesKelly H Leggett, MD   BP 136/56 mmHg  Pulse 115  Temp(Src) 99 F (37.2 C) (Oral)  Resp 22  Ht 5\' 10"  (1.778 m)  Wt 163 lb (73.936 kg)  BMI 23.39 kg/m2  SpO2 99%  LMP 11/06/2014 Physical Exam  Constitutional: She is oriented to person, place, and time. She appears well-developed and well-nourished. No distress.  Patient is extremely dramatic. She is hyperventilating.  HENT:  Head: Normocephalic and atraumatic.  Neck: Normal range of motion. Neck supple.  Cardiovascular: Normal  rate and regular rhythm.  Exam reveals no gallop and no friction rub.   No murmur heard. Pulmonary/Chest: Effort normal and breath sounds normal. No respiratory distress. She has no wheezes.  Abdominal: Soft. Bowel sounds are normal. She exhibits no distension. There is no tenderness.  Musculoskeletal: Normal range of motion.  Neurological: She is alert and oriented to person, place, and time.  Skin: Skin is warm and dry. She is not diaphoretic.  Nursing note and vitals reviewed.   ED Course  Procedures (including critical care time) Labs Review Labs Reviewed  URINALYSIS, ROUTINE W REFLEX MICROSCOPIC  PREGNANCY, URINE    Imaging Review No results found.   EKG Interpretation None      MDM   Final diagnoses:  None    Patient presents here with complaints of chest congestion and cough for the past month. She is very dramatic in her presentation and is tearful, crying, and hyperventilating. Her chest x-ray reveals what appears to be an atypical pneumonia. This will be treated with Zithromax and Tessalon. She has insisted upon pain medication while in the emergency department, however I do not feel as though this is appropriate for her illness and given her history of drug addiction and prescription alteration.     Geoffery Lyonsouglas Cecylia Brazill, MD 11/20/14 [phone]

## 2014-11-20 NOTE — ED Notes (Signed)
Pt reports her children have been ill with cough and cold symptoms, now she has a cough also.

## 2015-03-29 ENCOUNTER — Emergency Department (HOSPITAL_COMMUNITY)
Admission: EM | Admit: 2015-03-29 | Discharge: 2015-03-29 | Disposition: A | Discharge status: Home / Self Care | Payer: Self-pay | Attending: Emergency Medicine | Admitting: Emergency Medicine

## 2015-03-29 ENCOUNTER — Emergency Department (HOSPITAL_COMMUNITY): Payer: Medicaid Other

## 2015-03-29 ENCOUNTER — Encounter (HOSPITAL_COMMUNITY): Payer: Self-pay | Admitting: Emergency Medicine

## 2015-03-29 DIAGNOSIS — F1910 Other psychoactive substance abuse, uncomplicated: Secondary | ICD-10-CM

## 2015-03-29 DIAGNOSIS — F419 Anxiety disorder, unspecified: Secondary | ICD-10-CM | POA: Insufficient documentation

## 2015-03-29 DIAGNOSIS — F1210 Cannabis abuse, uncomplicated: Secondary | ICD-10-CM | POA: Insufficient documentation

## 2015-03-29 DIAGNOSIS — F1410 Cocaine abuse, uncomplicated: Secondary | ICD-10-CM | POA: Insufficient documentation

## 2015-03-29 DIAGNOSIS — Z3202 Encounter for pregnancy test, result negative: Secondary | ICD-10-CM | POA: Insufficient documentation

## 2015-03-29 DIAGNOSIS — Z765 Malingerer [conscious simulation]: Secondary | ICD-10-CM

## 2015-03-29 DIAGNOSIS — R109 Unspecified abdominal pain: Secondary | ICD-10-CM | POA: Insufficient documentation

## 2015-03-29 DIAGNOSIS — F1310 Sedative, hypnotic or anxiolytic abuse, uncomplicated: Secondary | ICD-10-CM | POA: Insufficient documentation

## 2015-03-29 DIAGNOSIS — Z7289 Other problems related to lifestyle: Secondary | ICD-10-CM | POA: Insufficient documentation

## 2015-03-29 DIAGNOSIS — F1110 Opioid abuse, uncomplicated: Secondary | ICD-10-CM | POA: Insufficient documentation

## 2015-03-29 DIAGNOSIS — Z8614 Personal history of Methicillin resistant Staphylococcus aureus infection: Secondary | ICD-10-CM | POA: Insufficient documentation

## 2015-03-29 DIAGNOSIS — Z87891 Personal history of nicotine dependence: Secondary | ICD-10-CM | POA: Insufficient documentation

## 2015-03-29 DIAGNOSIS — Z87442 Personal history of urinary calculi: Secondary | ICD-10-CM | POA: Insufficient documentation

## 2015-03-29 LAB — RAPID URINE DRUG SCREEN, HOSP PERFORMED
Amphetamines: NOT DETECTED
BARBITURATES: NOT DETECTED
BENZODIAZEPINES: POSITIVE — AB
COCAINE: POSITIVE — AB
OPIATES: POSITIVE — AB
Tetrahydrocannabinol: POSITIVE — AB

## 2015-03-29 LAB — BASIC METABOLIC PANEL
Anion gap: 9 (ref 5–15)
BUN: 10 mg/dL (ref 6–20)
CO2: 23 mmol/L (ref 22–32)
Calcium: 9 mg/dL (ref 8.9–10.3)
Chloride: 109 mmol/L (ref 101–111)
Creatinine, Ser: 0.98 mg/dL (ref 0.44–1.00)
GFR calc Af Amer: >60 mL/min (ref >60)
Glucose, Bld: 103 mg/dL — ABNORMAL HIGH (ref 65–99)
POTASSIUM: 3.2 mmol/L — AB (ref 3.5–5.1)
SODIUM: 141 mmol/L (ref 135–145)

## 2015-03-29 LAB — CBC WITH DIFFERENTIAL/PLATELET
BASOS ABS: 0 10*3/uL (ref 0.0–0.1)
Basophils Relative: 0 % (ref 0–1)
EOS ABS: 0 10*3/uL (ref 0.0–0.7)
EOS PCT: 0 % (ref 0–5)
HEMATOCRIT: 40.2 % (ref 36.0–46.0)
Hemoglobin: 12.9 g/dL (ref 12.0–15.0)
LYMPHS ABS: 2.3 10*3/uL (ref 0.7–4.0)
LYMPHS PCT: 15 % (ref 12–46)
MCH: 27.5 pg (ref 26.0–34.0)
MCHC: 32.1 g/dL (ref 30.0–36.0)
MCV: 85.7 fL (ref 78.0–100.0)
MONO ABS: 0.9 10*3/uL (ref 0.1–1.0)
Monocytes Relative: 6 % (ref 3–12)
Neutro Abs: 11.8 10*3/uL — ABNORMAL HIGH (ref 1.7–7.7)
Neutrophils Relative %: 79 % — ABNORMAL HIGH (ref 43–77)
Platelets: 249 10*3/uL (ref 150–400)
RBC: 4.69 MIL/uL (ref 3.87–5.11)
RDW: 15.6 % — AB (ref 11.5–15.5)
WBC: 15.1 10*3/uL — AB (ref 4.0–10.5)

## 2015-03-29 LAB — URINALYSIS, ROUTINE W REFLEX MICROSCOPIC
Glucose, UA: NEGATIVE mg/dL
Hgb urine dipstick: NEGATIVE
KETONES UR: NEGATIVE mg/dL
NITRITE: NEGATIVE
PH: 5.5 (ref 5.0–8.0)
Protein, ur: 30 mg/dL — AB
SPECIFIC GRAVITY, URINE: 1.033 — AB (ref 1.005–1.030)
Urobilinogen, UA: 1 mg/dL (ref 0.0–1.0)

## 2015-03-29 LAB — URINE MICROSCOPIC-ADD ON

## 2015-03-29 LAB — POC URINE PREG, ED: Preg Test, Ur: NEGATIVE

## 2015-03-29 MED ORDER — ONDANSETRON HCL 4 MG/2ML IJ SOLN
4.0000 mg | Freq: Once | INTRAMUSCULAR | Status: AC
  Administered 2015-03-29: 4 mg via INTRAVENOUS
  Filled 2015-03-29: qty 2

## 2015-03-29 MED ORDER — PHENAZOPYRIDINE HCL 200 MG PO TABS: 200.0000 mg | ORAL_TABLET | Freq: Three times a day (TID) | ORAL | Status: DC

## 2015-03-29 MED ORDER — HYDROMORPHONE HCL 1 MG/ML IJ SOLN
1.0000 mg | Freq: Once | INTRAMUSCULAR | Status: AC
  Administered 2015-03-29: 1 mg via INTRAVENOUS
  Filled 2015-03-29: qty 1

## 2015-03-29 MED ORDER — KETOROLAC TROMETHAMINE 30 MG/ML IJ SOLN
15.0000 mg | Freq: Once | INTRAMUSCULAR | Status: AC
  Administered 2015-03-29: 15 mg via INTRAVENOUS
  Filled 2015-03-29: qty 1

## 2015-03-29 MED ORDER — SODIUM CHLORIDE 0.9 % IV BOLUS (SEPSIS)
1000.0000 mL | Freq: Once | INTRAVENOUS | Status: AC
  Administered 2015-03-29: 1000 mL via INTRAVENOUS

## 2015-03-29 MED ORDER — FENTANYL CITRATE (PF) 100 MCG/2ML IJ SOLN
100.0000 ug | Freq: Once | INTRAMUSCULAR | Status: AC
  Administered 2015-03-29: 100 ug via INTRAVENOUS
  Filled 2015-03-29: qty 2

## 2015-03-29 NOTE — ED Notes (Signed)
Awake. Verbally responsive. A/O x4. Resp even and unlabored. No audible adventitious breath sounds noted. ABC's intact.  

## 2015-03-29 NOTE — ED Notes (Signed)
Resting quietly with eyes closed. Easily arousable. Verbally responsive. Resp even and unlabored. No audible adventitious breath sounds noted. ABC's intact. No N/V/D reported. IV saline lock patent

## 2015-03-29 NOTE — ED Notes (Signed)
Per EMS , pt. Picked up from her hotel with complaint of left flank pain at 10/10 which started yesterday at 6pm, got worsen this morning. Pt. Had history of kidney stones. Pt. Stated that she has hematuria and dysuria since yesterday. With nausea and vomiting. Pt. Received of IV Fentanyl and 4 mg IV Zofran on board EMS.

## 2015-03-29 NOTE — ED Notes (Addendum)
Awake. Verbally responsive. Resp even and unlabored. No audible adventitious breath sounds noted. ABC's intact. No N/V/D reported. IV infusing NS at 999ml/hr without difficulty. 

## 2015-03-29 NOTE — ED Notes (Addendum)
Pt reported lf flank pain and radiates to lower abd with n/v and diarrhea. IV saline lock patent and intact. Pt reported having poor intake.

## 2015-03-29 NOTE — ED Provider Notes (Signed)
CSN: [id number]     Arrival date & time 03/29/15  8657 History   First MD Initiated Contact with Patient 03/29/15 0701     Chief Complaint  Patient presents with  . Flank Pain      HPI Patient presents with 1-2 day history of left flank and lower quadrant pain associated with severe dysuria urgency and frequency.  Has history of kidney stones.  Also has noted some hematuria. Past Medical History  Diagnosis Date  . MRSA (methicillin resistant staph aureus) culture positive   . Anxiety     currently no meds  . Pregnancy induced hypertension     first preg  . Drug abuse, opioid type     released from [facility] for falsifying prescriptions  . Pregnant   . Kidney stone   . Kidney stone   . Mental disorder 04/2011    states was advised for detox but couldnt due to 1st pregnancy   Past Surgical History  Procedure Laterality Date  . Wisdom tooth extraction  1995   Family History  Problem Relation Age of Onset  . Hypertension Mother    History  Substance Use Topics  . Smoking status: Former Smoker -- 0.00 packs/day for 12 years  . Smokeless tobacco: Never Used  . Alcohol Use: No   OB History    Gravida Para Term Preterm AB TAB SAB Ectopic Multiple Living   3 3 3  0 0 0 0 0 0 3     Review of Systems  All other systems reviewed and are negative  Allergies  Citalopram hydrobromide; Propoxyphene n-acetaminophen; and Tramadol  Home Medications   Prior to Admission medications   Medication Sig Start Date End Date Taking? Authorizing Provider  amLODipine (NORVASC) 10 MG tablet Take 1 tablet (10 mg total) by mouth daily. Patient not taking: Reported on 03/29/2015 03/05/13   Nani Ravens, MD  azithromycin (ZITHROMAX Z-PAK) 250 MG tablet 2 po day one, then 1 daily x 4 days Patient not taking: Reported on 03/29/2015 11/20/14   Geoffery Lyons, MD  benzonatate (TESSALON) 100 MG capsule Take 1 capsule (100 mg total) by mouth every 8 (eight) hours. Patient not taking: Reported on  03/29/2015 11/20/14   Geoffery Lyons, MD  butalbital-acetaminophen-caffeine (FIORICET, ESGIC) [phone] MG per tablet Take 1 tablet by mouth 2 (two) times daily as needed for headache. Patient not taking: Reported on 03/29/2015 04/14/13   Currie Paris, NP  famotidine (PEPCID) 20 MG tablet Take 1 tablet (20 mg total) by mouth 2 (two) times daily. Patient not taking: Reported on 03/29/2015 02/20/14   Gilda Crease, MD  FLUoxetine (PROZAC) 20 MG capsule Take 1 capsule (20 mg total) by mouth daily. Patient not taking: Reported on 03/29/2015 03/28/13   Adam Phenix, MD  hydrochlorothiazide (HYDRODIURIL) 25 MG tablet Take 1 tablet (25 mg total) by mouth daily. Patient not taking: Reported on 03/29/2015 04/09/13   Minta Balsam, MD  ibuprofen (ADVIL,MOTRIN) 600 MG tablet Take 1 tablet (600 mg total) by mouth every 6 (six) hours. Patient not taking: Reported on 03/29/2015 03/05/13   Nani Ravens, MD  ondansetron (ZOFRAN) 4 MG tablet Take 1 tablet (4 mg total) by mouth every 6 (six) hours. Patient not taking: Reported on 03/29/2015 02/20/14   Gilda Crease, MD  oxyCODONE-acetaminophen (ROXICET) 5-325 MG per tablet Take 1 tablet by mouth every 4 (four) hours as needed for pain. Patient not taking: Reported on 03/29/2015 04/09/13   Minta Balsam,  MD  phenazopyridine (PYRIDIUM) 200 MG tablet Take 1 tablet (200 mg total) by mouth 3 (three) times daily. 03/29/15   Nelva Nay, MD  sucralfate (CARAFATE) 1 GM/10ML suspension Take 10 mLs (1 g total) by mouth 4 (four) times daily -  with meals and at bedtime. Patient not taking: Reported on 03/29/2015 02/20/14   Gilda Crease, MD  sulfamethoxazole-trimethoprim (BACTRIM DS) 800-160 MG per tablet Take 1 tablet by mouth 2 (two) times daily. Patient not taking: Reported on 03/29/2015 04/14/13   Lesly Dukes, MD   BP 142/92 mmHg  Pulse 80  Temp(Src) 99 F (37.2 C) (Oral)  Resp 18  SpO2 97%  LMP 03/15/2015 Physical Exam Physical Exam  Nursing  note and vitals reviewed. Constitutional: She is oriented to person, place, and time. She appears well-developed and well-nourished.  Patient appears uncomfortable because of pain.   HENT:  Head: Normocephalic and atraumatic.  Eyes: Pupils are equal, round, and reactive to light.  Neck: Normal range of motion.  Cardiovascular: Normal rate and intact distal pulses.   Pulmonary/Chest: No respiratory distress.  Abdominal: Normal appearance. She exhibits no distension.  Mild CVA tenderness noted on the left flank  Musculoskeletal: Normal range of motion.  Neurological: She is alert and oriented to person, place, and time. No cranial nerve deficit.  Skin: Skin is warm and dry. No rash noted.  Psychiatric: She has a normal mood and affect. Her behavior is normal.   ED Course  Procedures (including critical care time) Labs Review Labs Reviewed  CBC WITH DIFFERENTIAL/PLATELET - Abnormal; Notable for the following:    WBC 15.1 (*)    RDW 15.6 (*)    Neutrophils Relative % 79 (*)    Neutro Abs 11.8 (*)    All other components within normal limits  BASIC METABOLIC PANEL - Abnormal; Notable for the following:    Potassium 3.2 (*)    Glucose, Bld 103 (*)    All other components within normal limits  URINALYSIS, ROUTINE W REFLEX MICROSCOPIC (NOT AT [facility]) - Abnormal; Notable for the following:    Color, Urine AMBER (*)    APPearance TURBID (*)    Specific Gravity, Urine 1.033 (*)    Bilirubin Urine SMALL (*)    Protein, ur 30 (*)    Leukocytes, UA TRACE (*)    All other components within normal limits  URINE RAPID DRUG SCREEN, HOSP PERFORMED - Abnormal; Notable for the following:    Opiates POSITIVE (*)    Cocaine POSITIVE (*)    Benzodiazepines POSITIVE (*)    Tetrahydrocannabinol POSITIVE (*)    All other components within normal limits  URINE CULTURE  URINE MICROSCOPIC-ADD ON  POC URINE PREG, ED    Imaging Review Ct Renal Stone Study  03/29/2015   CLINICAL DATA:  Left-sided flank  pain for 3 days. Hematuria. History of renal stones.  EXAM: CT ABDOMEN AND PELVIS WITHOUT CONTRAST  TECHNIQUE: Multidetector CT imaging of the abdomen and pelvis was performed following the standard protocol without IV contrast.  COMPARISON:  04/17/2011; 02/18/2011  FINDINGS: The lack of intravenous contrast limits the ability to evaluate solid abdominal organs.  Normal noncontrast appearance of the bilateral kidneys. No renal stones. No renal stones are seen along the expected course of either ureter or the urinary bladder. Several phleboliths are seen within the left hemipelvis, unchanged. Normal noncontrast appearance of the urinary bladder given degree distention.  Normal hepatic contour. Normal noncontrast appearance of the gallbladder. No radiopaque gallstones. No  ascites.  Normal noncontrast appearance of the bilateral adrenal glands, pancreas and spleen. Incidental note is made of a small splenule.  The bowel is normal in course and caliber without wall thickening or evidence of obstruction. Normal noncontrast appearance of the retrocecal appendix. No pneumoperitoneum, pneumatosis or portal venous gas.  Normal caliber of the abdominal aorta.  No bulky retroperitoneal, mesenteric, pelvic or inguinal lymphadenopathy.  Normal noncontrast appearance of the pelvic organs. No free fluid in the pelvic cul-de-sac.  Limited visualization of the lower thorax is negative for focal airspace opacity or pleural effusion.  Normal heart size.  No pericardial effusion.  No acute or aggressive osseous abnormalities.  Regional soft tissues appear normal.   IMPRESSION: No explanation for patient's left-sided flank pain and hematuria. Specifically, no evidence of nephrolithiasis or urinary obstruction.    Electronically Signed   By: Simonne Come M.D.   On: 03/29/2015 09:32     I discussed with the patient my concern that she has a drug abuse problem.  She began negotiating with me for just a few pain pills until she can see  a doctor but I instructed that I would only treat her pain while she was in the emergency room.  I did write her prescription for Pyridium for dysuria but did not write any narcotics. MDM   Final diagnoses:  Left flank pain  Drug abuse  Drug-seeking behavior        Nelva Nay, MD 03/29/15 1043

## 2015-03-29 NOTE — ED Notes (Addendum)
Dr. Radford Pax and this nurse at bedside to discuss d/c instructions. Pt made aware of unable to give pain prescription medications. Pt verbalized understanding.

## 2015-03-29 NOTE — Discharge Instructions (Signed)
Flank Pain Flank pain is pain in your side. The flank is the area of your side between your upper belly (abdomen) and your back. Pain in this area can be caused by many different things. HOME CARE Home care and treatment will depend on the cause of your pain.  Rest as told by your doctor.  Drink enough fluids to keep your pee (urine) clear or pale yellow.  Only take medicine as told by your doctor.  Tell your doctor about any changes in your pain.  Follow up with your doctor. GET HELP RIGHT AWAY IF:   Your pain does not get better with medicine.   You have new symptoms or your symptoms get worse.  Your pain gets worse.   You have belly (abdominal) pain.   You are short of breath.   You always feel sick to your stomach (nauseous).   You keep throwing up (vomiting).   You have puffiness (swelling) in your belly.   You feel light-headed or you pass out (faint).   You have blood in your pee.  You have a fever or lasting symptoms for more than 2-3 days.  You have a fever and your symptoms suddenly get worse. MAKE SURE YOU:   Understand these instructions.  Will watch your condition.  Will get help right away if you are not doing well or get worse. Document Released: 05/24/2008 Document Revised: 12/30/2013 Document Reviewed: 03/29/2012 [facility] Patient Information 2015 [place], [place]. This information is not intended to replace advice given to you by your health care provider. Make sure you discuss any questions you have with your health care provider.  Chronic Pain Chronic pain can be defined as pain that is off and on and lasts for 3-6 months or longer. Many things cause chronic pain, which can make it difficult to make a diagnosis. There are many treatment options available for chronic pain. However, finding a treatment that works well for you may require trying various approaches until the right one is found. Many people benefit from a combination of two or  more types of treatment to control their pain. SYMPTOMS  Chronic pain can occur anywhere in the body and can range from mild to very severe. Some types of chronic pain include:  Headache.  Low back pain.  Cancer pain.  Arthritis pain.  Neurogenic pain. This is pain resulting from damage to nerves. People with chronic pain may also have other symptoms such as:  Depression.  Anger.  Insomnia.  Anxiety. DIAGNOSIS  Your health care provider will help diagnose your condition over time. In many cases, the initial focus will be on excluding possible conditions that could be causing the pain. Depending on your symptoms, your health care provider may order tests to diagnose your condition. Some of these tests may include:   Blood tests.   CT scan.   MRI.   X-rays.   Ultrasounds.   Nerve conduction studies.  You may need to see a specialist.  TREATMENT  Finding treatment that works well may take time. You may be referred to a pain specialist. He or she may prescribe medicine or therapies, such as:   Mindful meditation or yoga.  Shots (injections) of numbing or pain-relieving medicines into the spine or area of pain.  Local electrical stimulation.  Acupuncture.   Massage therapy.   Aroma, color, light, or sound therapy.   Biofeedback.   Working with a physical therapist to keep from getting stiff.   Regular, gentle exercise.  Cognitive or behavioral therapy.   Group support.  Sometimes, surgery may be recommended.  HOME CARE INSTRUCTIONS   Take all medicines as directed by your health care provider.   Lessen stress in your life by relaxing and doing things such as listening to calming music.   Exercise or be active as directed by your health care provider.   Eat a healthy diet and include things such as vegetables, fruits, fish, and lean meats in your diet.   Keep all follow-up appointments with your health care provider.   Attend a  support group with others suffering from chronic pain. SEEK MEDICAL CARE IF:   Your pain gets worse.   You develop a new pain that was not there before.   You cannot tolerate medicines given to you by your health care provider.   You have new symptoms since your last visit with your health care provider.  SEEK IMMEDIATE MEDICAL CARE IF:   You feel weak.   You have decreased sensation or numbness.   You lose control of bowel or bladder function.   Your pain suddenly gets much worse.   You develop shaking.  You develop chills.  You develop confusion.  You develop chest pain.  You develop shortness of breath.  MAKE SURE YOU:  Understand these instructions.  Will watch your condition.  Will get help right away if you are not doing well or get worse. Document Released: 05/07/2002 Document Revised: 04/17/2013 Document Reviewed: 02/08/2013 [facility] Patient Information 2015 [place], [place]. This information is not intended to replace advice given to you by your health care provider. Make sure you discuss any questions you have with your health care provider.  Polysubstance Abuse When people abuse more than one drug or type of drug it is called polysubstance or polydrug abuse. For example, many smokers also drink alcohol. This is one form of polydrug abuse. Polydrug abuse also refers to the use of a drug to counteract an unpleasant effect produced by another drug. It may also be used to help with withdrawal from another drug. People who take stimulants may become agitated. Sometimes this agitation is countered with a tranquilizer. This helps protect against the unpleasant side effects. Polydrug abuse also refers to the use of different drugs at the same time.  Anytime drug use is interfering with normal living activities, it has become abuse. This includes problems with family and friends. Psychological dependence has developed when your mind tells you that the drug is  needed. This is usually followed by physical dependence which has developed when continuing increases of drug are required to get the same feeling or "high". This is known as addiction or chemical dependency. A person's risk is much higher if there is a history of chemical dependency in the family. SIGNS OF CHEMICAL DEPENDENCY  You have been told by friends or family that drugs have become a problem.  You fight when using drugs.  You are having blackouts (not remembering what you do while using).  You feel sick from using drugs but continue using.  You lie about use or amounts of drugs (chemicals) used.  You need chemicals to get you going.  You are suffering in work performance or in school because of drug use.  You get sick from use of drugs but continue to use anyway.  You need drugs to relate to people or feel comfortable in social situations.  You use drugs to forget problems. "Yes" answered to any of the above signs of  chemical dependency indicates there are problems. The longer the use of drugs continues, the greater the problems will become. If there is a family history of drug or alcohol use, it is best not to experiment with these drugs. Continual use leads to tolerance. After tolerance develops more of the drug is needed to get the same feeling. This is followed by addiction. With addiction, drugs become the most important part of life. It becomes more important to take drugs than participate in the other usual activities of life. This includes relating to friends and family. Addiction is followed by dependency. Dependency is a condition where drugs are now needed not just to get high, but to feel normal. Addiction cannot be cured but it can be stopped. This often requires outside help and the care of professionals. Treatment centers are listed in the yellow pages under: Cocaine, Narcotics, and Alcoholics Anonymous. Most hospitals and clinics can refer you to a specialized care  center. Talk to your caregiver if you need help. Document Released: 04/06/2005 Document Revised: 11/07/2011 Document Reviewed: 08/15/2005 [facility] Patient Information 2015 [place], [place]. This information is not intended to replace advice given to you by your health care provider. Make sure you discuss any questions you have with your health care provider.   Emergency Department Resource Guide 1) Find a Doctor and Pay Out of Pocket Although you won't have to find out who is covered by your insurance plan, it is a good idea to ask around and get recommendations. You will then need to call the office and see if the doctor you have chosen will accept you as a new patient and what types of options they offer for patients who are self-pay. Some doctors offer discounts or will set up payment plans for their patients who do not have insurance, but you will need to ask so you aren't surprised when you get to your appointment.  2) Contact Your Local Health Department Not all health departments have doctors that can see patients for sick visits, but many do, so it is worth a call to see if yours does. If you don't know where your local health department is, you can check in your phone book. The CDC also has a tool to help you locate your state's health department, and many state websites also have listings of all of their local health departments.  3) Find a Walk-in Clinic If your illness is not likely to be very severe or complicated, you may want to try a walk in clinic. These are popping up all over the country in pharmacies, drugstores, and shopping centers. They're usually staffed by nurse practitioners or physician assistants that have been trained to treat common illnesses and complaints. They're usually fairly quick and inexpensive. However, if you have serious medical issues or chronic medical problems, these are probably not your best option.  No Primary Care Doctor: - Call Health Connect at   [phone] - they can help you locate a primary care doctor that  accepts your insurance, provides certain services, etc. - Physician Referral Service- [phone]  Chronic Pain Problems: Organization         Address  Phone   Notes  [facility]  [phone] Patients need to be referred by their primary care doctor.   Medication Assistance: Organization         Address  Phone   Notes  [facility] Medication [facility] [address] [place], [place] [address] [phone] --  Must be a resident of [facility] -- Must have NO insurance coverage whatsoever (no Medicaid/ Medicare, etc.) -- The pt. MUST have a primary care doctor that directs their care regularly and follows them in the community   [facility]  [phone]   [employer]  [phone]    Agencies that provide inexpensive medical care: Organization         Address  Phone   Notes  [facility]  [phone]   [facility]    [phone]   [facility] [address] [place], [place] [address] [phone]   [facility] [address], [place] [phone]   [facility]    [phone]   [facility]    [phone]   [facility]  [address], [place] Phone:  [phone], Fax:  [phone] Hours of Operation:  9 am - 6 pm, M-F.  Also accepts Medicaid/Medicare and self-pay.  [facility]  [address], [place] Phone: [phone], Fax: [phone]. Hours of Operation:  8:30 am - 5:30 pm, M-F.  Also accepts Medicaid and self-pay.  [facility] [place] [address] [place] Phone: [phone]   [facility] [address] Natasha Bence [place], [place] [phone], Ext. [phone] Mondays & Thursdays: 7-9 AM.  First 15 patients are seen on a first come, first serve basis.     Medicaid-accepting [facility] Providers:  Organization         Address  Phone   Notes  [facility] [address], [place] [phone] Also accepts self-pay patients.  [facility] [address] Laurell Josephs [place], [place]  [phone]   [facility] [address], [place] [phone]   [facility] [address] [place] [phone]   Renaye Rakers [address], [place]   [phone] Only accepts [place] Access [place] patients after they have their name applied to their card.   Self-Pay (no insurance) in [facility]:  Organization         Address  Phone   Notes  Sickle Cell Patients, [facility] [address] [place], [place] [phone]   [facility] [address] [place], [place] [phone]   [facility] [place]  [address], [place] [phone]   [facility]/Dr. Osei-Bonsu  [address] [place] or [address], [place] [phone] Phone number for both [place] and [place] locations is the same.  [facility] [address] [place] [phone]   [facility] [address] [place] or [address] [phone]   [facility] [address] [place] [phone], phone; [phone], fax Sees patients 1st and 3rd Saturday of every month.  Must not qualify for public or private insurance (i.e. Medicaid, Medicare, [place] Health Choice, Veterans' Benefits)  Household income should be no more than 200% of the poverty level The clinic cannot treat you if you are pregnant or think you are pregnant  Sexually transmitted diseases are not treated at the clinic.    Dental Care: Organization  Address  Phone  Notes  [facility]  Department of [facility] [address] [place], [place] [phone] Accepts children up to age 26 who are enrolled in [place] or [place] Health Choice; pregnant women with a Medicaid card; and children who have applied for Medicaid or [place] Health Choice, but were declined, whose parents can pay a reduced fee at time of service.  [facility] Department of [facility]  [address], [place] [phone] Accepts children up to age 20 who are enrolled in [place] or [place] Health Choice; pregnant women with a Medicaid card; and children who have applied for Medicaid or [place] Health Choice, but were declined, whose parents can pay a reduced fee at time of service.  [facility]  [address] [place], [place] [phone] Patients are seen by appointment only. Walk-ins are not accepted. [facility] will see patients 48 years of age and older. Monday - Tuesday (8am-5pm) Most Wednesdays (8:30-5pm) $30 per visit, cash only  [facility] Adult Dental Access PROGRAM  [address], [facility] [phone] Patients are seen by appointment only. Walk-ins are not accepted. [facility] will see patients 28 years of age and older. One Wednesday Evening (Monthly: Volunteer Based).  $30 per visit, cash only  [employer]  [phone] for adults; Children under age 105, call Graduate Pediatric Dentistry at [phone]. Children aged 80-14, please call [phone] to request a pediatric application.  Dental services are provided in all areas of dental care including fillings, crowns and bridges, complete and partial dentures, implants, gum treatment, root canals, and extractions. Preventive care is also provided. Treatment is provided to both adults and children. Patients are selected via a lottery and there is often a waiting list.   [facility] [address] [place]  [phone]  [email or web address]   [facility] [address] [place], [place] [phone], Ext. [phone] Second and Fourth Thursday of each month, opens at 6:30 AM; Clinic ends at 9 AM.  Patients are seen on a first-come first-served basis, and a limited number are seen during each clinic.   [facility]  [address] Ether Griffins [place], [place] [phone]   Eligibility Requirements You must have lived in [place], [place], or [place] counties for at least the last three months.   You cannot be eligible for state or federal sponsored [place], including [employer], [place], or [employer].   You generally cannot be eligible for healthcare insurance through your employer.    How to apply: Eligibility screenings are held every Tuesday and Wednesday afternoon from 1:00 pm until 4:00 pm. You do not need an appointment for the interview!  [facility] [address], [place], [place] [phone]   [facility] Health Department  [phone]   [facility] Health Department  [phone]   [facility] Health Department  [phone]    Behavioral Health Resources in the Community: Intensive Outpatient Programs Organization         Address  Phone  Notes  [facility] Services [address] [place], [place] [phone]   [facility] Outpatient [address], [place], [place] [phone]   [facility] [address] [place], [place]  [phone]   [facility] [address],  [place], [place] [phone] or [phone]   Substance Abuse Resources  Organization         Address  Phone  Notes  [facility]  [phone]   [facility]  [phone]   [facility]  [phone]   Floydene Flock  [phone]   Residential & Outpatient Substance Abuse Program  [phone]   Psychological Services Organization          Address  Phone  Notes  [facility]  [phone]   [facility]  [phone]   [facility] [address] [place] [phone] or [phone]    Mobile Crisis Teams Organization         Address  Phone  Notes  [facility], Mobile Crisis Care Unit  [phone]   [facility]  [address] [place], [place] [phone]   Doristine Locks [address] [place] [phone]    Self-Help/Support Groups Organization         Address  Phone             Notes  [facility] [place] - variety of support groups  336- I7437963 Call for more information  Narcotics Anonymous (NA), [facility] [address], [employer] [place]  2 meetings at this location   Statistician         Address  Phone  Notes  [facility] [address],    [place]  [phone]   [facility]  [address], [place] [address], [place], [place] [phone]   [facility] [address] [place], [place] [phone] Admissions: 8am-3pm M-F  [facility] [address],    [place], [place] [phone]   [facility] [address] [place], [place], [place] [phone]   [facility] [address],  [place], [place] [phone]   [facility] - Intensive Outpatient [address] Laurell Josephs [address], [place], [place] [phone]   [facility] ([facility] [address] [place]  [place], [place] [phone] or [phone]   [facility] ([facility]) [address] [place], [place] [phone] Accepts Medicaid  [facility] [address]  [place] [phone] Substance Abuse/Addiction Treatment   [facility] Organization         Address  Phone  Notes  [facility]  [phone]   Angie Fava, PhD [address] Ervin Knack [place], [place]   [phone] or [phone]   [facility] Behavioral   [address] [place], [place] [phone]   [facility] [address] [place], [place] [phone] Insurance/Medicaid/sponsorship through [facility] and Families [address]                                    [place], [place] [phone] Therapy/tele-psych/case  [facility] [address], [place] [phone]    Dr. Lolly Mustache  [phone]   [facility]  [employer] [facility] Dept. 1) [address], [place] 2) [address] [place] [address], [place] 3)  [address], [place] [phone]  [phone]   [facility] Child Abuse Hotline 8726771662 or [phone] (After Hours)

## 2015-03-29 NOTE — ED Notes (Signed)
Awake. Verbally responsive. A/O x4. Resp even and unlabored. No audible adventitious breath sounds noted. ABC's intact. Requesting more pain medication. Dr. Radford Pax aware with new order noted.

## 2015-03-29 NOTE — ED Notes (Signed)
Bed: WA10 Expected date:  Expected time:  Means of arrival:  Comments: EMS-flank pain 

## 2015-03-30 LAB — URINE CULTURE

## 2015-11-10 ENCOUNTER — Encounter (HOSPITAL_BASED_OUTPATIENT_CLINIC_OR_DEPARTMENT_OTHER): Payer: Self-pay | Admitting: *Deleted

## 2015-11-10 ENCOUNTER — Emergency Department (HOSPITAL_BASED_OUTPATIENT_CLINIC_OR_DEPARTMENT_OTHER)
Admission: EM | Admit: 2015-11-10 | Discharge: 2015-11-10 | Discharge status: Against Medical Advice | Payer: Medicaid Other | Attending: Emergency Medicine | Admitting: Emergency Medicine

## 2015-11-10 DIAGNOSIS — Z87442 Personal history of urinary calculi: Secondary | ICD-10-CM | POA: Insufficient documentation

## 2015-11-10 DIAGNOSIS — F419 Anxiety disorder, unspecified: Secondary | ICD-10-CM | POA: Insufficient documentation

## 2015-11-10 DIAGNOSIS — Z79899 Other long term (current) drug therapy: Secondary | ICD-10-CM | POA: Insufficient documentation

## 2015-11-10 DIAGNOSIS — R109 Unspecified abdominal pain: Secondary | ICD-10-CM | POA: Insufficient documentation

## 2015-11-10 DIAGNOSIS — R319 Hematuria, unspecified: Secondary | ICD-10-CM | POA: Insufficient documentation

## 2015-11-10 DIAGNOSIS — Z765 Malingerer [conscious simulation]: Secondary | ICD-10-CM

## 2015-11-10 DIAGNOSIS — F17210 Nicotine dependence, cigarettes, uncomplicated: Secondary | ICD-10-CM | POA: Insufficient documentation

## 2015-11-10 DIAGNOSIS — Z8614 Personal history of Methicillin resistant Staphylococcus aureus infection: Secondary | ICD-10-CM | POA: Insufficient documentation

## 2015-11-10 DIAGNOSIS — Z7289 Other problems related to lifestyle: Secondary | ICD-10-CM | POA: Insufficient documentation

## 2015-11-10 DIAGNOSIS — Z3202 Encounter for pregnancy test, result negative: Secondary | ICD-10-CM | POA: Insufficient documentation

## 2015-11-10 LAB — URINALYSIS, ROUTINE W REFLEX MICROSCOPIC
Bilirubin Urine: NEGATIVE
GLUCOSE, UA: NEGATIVE mg/dL
Hgb urine dipstick: NEGATIVE
KETONES UR: NEGATIVE mg/dL
Leukocytes, UA: NEGATIVE
Nitrite: NEGATIVE
PH: 7.5 (ref 5.0–8.0)
Protein, ur: NEGATIVE mg/dL
Specific Gravity, Urine: 1.02 (ref 1.005–1.030)

## 2015-11-10 LAB — URINE MICROSCOPIC-ADD ON: RBC / HPF: NONE SEEN RBC/hpf (ref 0–5)

## 2015-11-10 LAB — PREGNANCY, URINE: Preg Test, Ur: NEGATIVE

## 2015-11-10 MED ORDER — CYCLOBENZAPRINE HCL 10 MG PO TABS
10.0000 mg | ORAL_TABLET | Freq: Once | ORAL | Status: DC
  Filled 2015-11-10: qty 1

## 2015-11-10 MED ORDER — KETOROLAC TROMETHAMINE 30 MG/ML IJ SOLN
30.0000 mg | Freq: Once | INTRAMUSCULAR | Status: DC
  Filled 2015-11-10: qty 1

## 2015-11-10 NOTE — ED Provider Notes (Signed)
CSN: [id number]     Arrival date & time 11/10/15  1659 History   First MD Initiated Contact with Patient 11/10/15 1710     Chief Complaint  Patient presents with  . Flank Pain     HPI  Patient presents for evaluation of flank pain. States that every now and then she notices "some pink" after she urinates. She is concerned that this is blood. States she has left sided flank pain. Rates it an 8 out of 10. States it "feels like when I had kidney stones".  Fevers or chills. No dysuria. Normal bowel movements. No chest pain no myalgias no flu symptoms.  Past Medical History  Diagnosis Date  . MRSA (methicillin resistant staph aureus) culture positive   . Anxiety     currently no meds  . Pregnancy induced hypertension     first preg  . Drug abuse, opioid type     released from [facility] for falsifying prescriptions  . Pregnant   . Kidney stone   . Kidney stone   . Mental disorder 04/2011    states was advised for detox but couldnt due to 1st pregnancy   Past Surgical History  Procedure Laterality Date  . Wisdom tooth extraction  1995   Family History  Problem Relation Age of Onset  . Hypertension Mother    Social History  Substance Use Topics  . Smoking status: Current Every Day Smoker -- 0.50 packs/day for 12 years    Types: Cigarettes  . Smokeless tobacco: Never Used  . Alcohol Use: No   OB History    Gravida Para Term Preterm AB TAB SAB Ectopic Multiple Living   0 0 0 0 0 0 3     Review of Systems  Constitutional: Negative for fever, chills, diaphoresis, appetite change and fatigue.  HENT: Negative for mouth sores, sore throat and trouble swallowing.   Eyes: Negative for visual disturbance.  Respiratory: Negative for cough, chest tightness, shortness of breath and wheezing.   Cardiovascular: Negative for chest pain.  Gastrointestinal: Negative for nausea, vomiting, abdominal pain, diarrhea and abdominal distention.  Endocrine: Negative for polydipsia,  polyphagia and polyuria.  Genitourinary: Positive for hematuria and flank pain. Negative for dysuria and frequency.  Musculoskeletal: Negative for gait problem.  Skin: Negative for color change, pallor and rash.  Neurological: Negative for dizziness, syncope, light-headedness and headaches.  Hematological: Does not bruise/bleed easily.  Psychiatric/Behavioral: Negative for behavioral problems and confusion.      Allergies  Citalopram hydrobromide; Propoxyphene n-acetaminophen; and Tramadol  Home Medications   Prior to Admission medications   Medication Sig Start Date End Date Taking? Authorizing Provider  QUEtiapine (SEROQUEL) 100 MG tablet Take 100 mg by mouth at bedtime.   Yes Historical Provider, MD  amLODipine (NORVASC) 10 MG tablet Take 1 tablet (10 mg total) by mouth daily. Patient not taking: Reported on 03/29/2015 03/05/13   Nani Ravens, MD  azithromycin (ZITHROMAX Z-PAK) 250 MG tablet 2 po day one, then 1 daily x 4 days Patient not taking: Reported on 03/29/2015 11/20/14   Geoffery Lyons, MD  benzonatate (TESSALON) 100 MG capsule Take 1 capsule (100 mg total) by mouth every 8 (eight) hours. Patient not taking: Reported on 03/29/2015 11/20/14   Geoffery Lyons, MD  butalbital-acetaminophen-caffeine (FIORICET, ESGIC) [phone] MG per tablet Take 1 tablet by mouth 2 (two) times daily as needed for headache. Patient not taking: Reported on 03/29/2015 04/14/13   Currie Paris, NP  famotidine (PEPCID)  20 MG tablet Take 1 tablet (20 mg total) by mouth 2 (two) times daily. Patient not taking: Reported on 03/29/2015 02/20/14   Gilda Crease, MD  FLUoxetine (PROZAC) 20 MG capsule Take 1 capsule (20 mg total) by mouth daily. Patient not taking: Reported on 03/29/2015 03/28/13   Adam Phenix, MD  hydrochlorothiazide (HYDRODIURIL) 25 MG tablet Take 1 tablet (25 mg total) by mouth daily. Patient not taking: Reported on 03/29/2015 04/09/13   Minta Balsam, MD  ibuprofen (ADVIL,MOTRIN) 600 MG  tablet Take 1 tablet (600 mg total) by mouth every 6 (six) hours. Patient not taking: Reported on 03/29/2015 03/05/13   Nani Ravens, MD  ondansetron (ZOFRAN) 4 MG tablet Take 1 tablet (4 mg total) by mouth every 6 (six) hours. Patient not taking: Reported on 03/29/2015 02/20/14   Gilda Crease, MD  oxyCODONE-acetaminophen (ROXICET) 5-325 MG per tablet Take 1 tablet by mouth every 4 (four) hours as needed for pain. Patient not taking: Reported on 03/29/2015 04/09/13   Minta Balsam, MD  phenazopyridine (PYRIDIUM) 200 MG tablet Take 1 tablet (200 mg total) by mouth 3 (three) times daily. 03/29/15   Nelva Nay, MD  sucralfate (CARAFATE) 1 GM/10ML suspension Take 10 mLs (1 g total) by mouth 4 (four) times daily -  with meals and at bedtime. Patient not taking: Reported on 03/29/2015 02/20/14   Gilda Crease, MD  sulfamethoxazole-trimethoprim (BACTRIM DS) 800-160 MG per tablet Take 1 tablet by mouth 2 (two) times daily. Patient not taking: Reported on 03/29/2015 04/14/13   Lesly Dukes, MD   BP 168/113 mmHg  Pulse 110  Temp(Src) 98.4 F (36.9 C) (Oral)  Resp 16  SpO2 100%  LMP 10/27/2015 Physical Exam  Constitutional: She is oriented to person, place, and time. She appears well-developed and well-nourished. No distress.  Does not appear uncomfortable. Interacting with her husband as I enter the room.  HENT:  Head: Normocephalic.  Eyes: Conjunctivae are normal. Pupils are equal, round, and reactive to light. No scleral icterus.  Neck: Normal range of motion. Neck supple. No thyromegaly present.  Cardiovascular: Normal rate and regular rhythm.  Exam reveals no gallop and no friction rub.   No murmur heard. Pulmonary/Chest: Effort normal and breath sounds normal. No respiratory distress. She has no wheezes. She has no rales.  Abdominal: Soft. Bowel sounds are normal. She exhibits no distension. There is no tenderness. There is no rebound.  Points to her left flank. No reproducible  tenderness in the flank or abdomen. No vesicles on the abdominal wall  Musculoskeletal: Normal range of motion.  Neurological: She is alert and oriented to person, place, and time.  Skin: Skin is warm and dry. No rash noted.  Psychiatric: She has a normal mood and affect. Her behavior is normal.    ED Course  Procedures (including critical care time) Labs Review Labs Reviewed  URINALYSIS, ROUTINE W REFLEX MICROSCOPIC (NOT AT [facility]) - Abnormal; Notable for the following:    APPearance TURBID (*)    All other components within normal limits  URINE MICROSCOPIC-ADD ON - Abnormal; Notable for the following:    Squamous Epithelial / LPF 0-5 (*)    Bacteria, UA MANY (*)    All other components within normal limits  PREGNANCY, URINE    Imaging Review No results found. I have personally reviewed and evaluated these images and lab results as part of my medical decision-making.   EKG Interpretation None      MDM  Final diagnoses:  Flank pain  Drug-seeking behavior    I offered Toradol and muscle relaxant. Discussed with her that I would investigate for possible kidney stone. A CT scan or ultrasound. However, I offered nonnarcotic treatment last signs of infection or stone. Expressed to her that her urinalysis showed no signs of blood or infection at this time.  Of note, there are multiple areas within her chart of physicians expressing concern for drug-seeking behavior. Apparently was dismissed one previous practice for misuse of narcotics.  Less than 1 minute after leaving the room after my discussion about nonnarcotic treatment until studies were completed, the patient contacted the nurse and stated that she would like to leave.    Rolland PorterMark Annaston Upham, MD 11/10/15 [phone]

## 2015-11-10 NOTE — ED Notes (Addendum)
Pt c/o left flank pain onset yest a.m. Pain with urination. Pink at times- notices when wiping. Hx kidney stones. Also has raised reddened areas to both forearms from TB tests done 2 weeks ago. No reaction after the 3 days, but "popped up" about 2 weeks after.

## 2015-11-10 NOTE — ED Notes (Signed)
Pt requesting to leave. Attempted to discuss medication and course of treatment, but pt declined and asked that her IV be removed. Charge RN, Amy made aware.

## 2015-11-10 NOTE — ED Notes (Signed)
Left flank pain and hematuria. Dysuria.

## 2015-11-15 ENCOUNTER — Emergency Department (HOSPITAL_BASED_OUTPATIENT_CLINIC_OR_DEPARTMENT_OTHER)
Admission: EM | Admit: 2015-11-15 | Discharge: 2015-11-16 | Disposition: A | Discharge status: Home / Self Care | Payer: Medicaid Other | Attending: Emergency Medicine | Admitting: Emergency Medicine

## 2015-11-15 ENCOUNTER — Encounter (HOSPITAL_BASED_OUTPATIENT_CLINIC_OR_DEPARTMENT_OTHER): Payer: Self-pay | Admitting: *Deleted

## 2015-11-15 DIAGNOSIS — Y9289 Other specified places as the place of occurrence of the external cause: Secondary | ICD-10-CM | POA: Insufficient documentation

## 2015-11-15 DIAGNOSIS — Z87442 Personal history of urinary calculi: Secondary | ICD-10-CM | POA: Insufficient documentation

## 2015-11-15 DIAGNOSIS — S51832A Puncture wound without foreign body of left forearm, initial encounter: Secondary | ICD-10-CM | POA: Insufficient documentation

## 2015-11-15 DIAGNOSIS — K029 Dental caries, unspecified: Secondary | ICD-10-CM | POA: Insufficient documentation

## 2015-11-15 DIAGNOSIS — Z9889 Other specified postprocedural states: Secondary | ICD-10-CM | POA: Insufficient documentation

## 2015-11-15 DIAGNOSIS — F419 Anxiety disorder, unspecified: Secondary | ICD-10-CM | POA: Insufficient documentation

## 2015-11-15 DIAGNOSIS — Y9389 Activity, other specified: Secondary | ICD-10-CM | POA: Insufficient documentation

## 2015-11-15 DIAGNOSIS — W460XXA Contact with hypodermic needle, initial encounter: Secondary | ICD-10-CM | POA: Insufficient documentation

## 2015-11-15 DIAGNOSIS — F17210 Nicotine dependence, cigarettes, uncomplicated: Secondary | ICD-10-CM | POA: Insufficient documentation

## 2015-11-15 DIAGNOSIS — R112 Nausea with vomiting, unspecified: Secondary | ICD-10-CM | POA: Insufficient documentation

## 2015-11-15 DIAGNOSIS — R51 Headache: Secondary | ICD-10-CM | POA: Insufficient documentation

## 2015-11-15 DIAGNOSIS — K0889 Other specified disorders of teeth and supporting structures: Secondary | ICD-10-CM | POA: Insufficient documentation

## 2015-11-15 DIAGNOSIS — Y998 Other external cause status: Secondary | ICD-10-CM | POA: Insufficient documentation

## 2015-11-15 DIAGNOSIS — Z8614 Personal history of Methicillin resistant Staphylococcus aureus infection: Secondary | ICD-10-CM | POA: Insufficient documentation

## 2015-11-15 DIAGNOSIS — Z79899 Other long term (current) drug therapy: Secondary | ICD-10-CM | POA: Insufficient documentation

## 2015-11-15 NOTE — ED Notes (Signed)
C/o L upper dental pain, throbs and swollen, mentions fracture and exposed root. Also reports nausea and HA. Denies fever. vomited 2d ago. Mention skin abscess in bilateral FAs (h/o similar).

## 2015-11-16 ENCOUNTER — Encounter (HOSPITAL_BASED_OUTPATIENT_CLINIC_OR_DEPARTMENT_OTHER): Payer: Self-pay | Admitting: Emergency Medicine

## 2015-11-16 ENCOUNTER — Telehealth (HOSPITAL_BASED_OUTPATIENT_CLINIC_OR_DEPARTMENT_OTHER): Payer: Self-pay | Admitting: Emergency Medicine

## 2015-11-16 MED ORDER — PENICILLIN V POTASSIUM 250 MG PO TABS: 500.0000 mg | ORAL_TABLET | Freq: Four times a day (QID) | ORAL | Status: AC

## 2015-11-16 MED ORDER — ONDANSETRON 8 MG PO TBDP
8.0000 mg | ORAL_TABLET | Freq: Once | ORAL | Status: AC
  Administered 2015-11-16: 8 mg via ORAL
  Filled 2015-11-16: qty 1

## 2015-11-16 MED ORDER — GI COCKTAIL ~~LOC~~
30.0000 mL | Freq: Once | ORAL | Status: AC
  Administered 2015-11-16: 30 mL via ORAL
  Filled 2015-11-16: qty 30

## 2015-11-16 MED ORDER — CHLORHEXIDINE GLUCONATE 0.12 % MT SOLN: 15.0000 mL | Freq: Two times a day (BID) | OROMUCOSAL | Status: DC

## 2015-11-16 MED ORDER — ONDANSETRON 8 MG PO TBDP: ORAL_TABLET | ORAL | Status: DC

## 2015-11-16 MED ORDER — NAPROXEN 250 MG PO TABS
500.0000 mg | ORAL_TABLET | Freq: Once | ORAL | Status: AC
  Administered 2015-11-16: 500 mg via ORAL
  Filled 2015-11-16: qty 2

## 2015-11-16 MED ORDER — CLINDAMYCIN HCL 300 MG PO CAPS: 300.0000 mg | ORAL_CAPSULE | Freq: Four times a day (QID) | ORAL | Status: DC

## 2015-11-16 MED ORDER — CLINDAMYCIN HCL 150 MG PO CAPS
300.0000 mg | ORAL_CAPSULE | Freq: Once | ORAL | Status: AC
  Administered 2015-11-16: 300 mg via ORAL
  Filled 2015-11-16: qty 2

## 2015-11-16 NOTE — ED Notes (Signed)
Pt here with her mother, who is also checked in to be seen. States she wants to wait in her mom's room while she is being seen.

## 2015-11-16 NOTE — Discharge Instructions (Signed)
Community Resource Guide Dental °The [employer]’s “211” is a great source of information about community services available.  Access by dialing 2-1-1 from anywhere in [place], or by website -  www.nc211.org.  ° °Other Local Resources (Updated 08/2015) ° °Dental  Care °  °Services ° °  °Phone Number and Address  °Cost  °[facility] For children 0 - 36 years of age:  °• Cleaning °• Tooth brushing/flossing instruction °• Sealants, fillings, crowns °• Extractions °• Emergency treatment  [phone] °[address] °[place], [place] [address] Charges based on family income.  Medicaid and some insurance plans accepted.   °  °[facility] [place] • Cleaning °• Sealants, fillings, crowns °• Extractions °• Emergency treatment [phone] °[address] °[place], [place] ° Pregnant women 18 years of age or older with a Medicaid card  °[facility] [place] • Cleaning °• Sealants, fillings, crowns °• Extractions °• Emergency treatment [phone] °[address] °[place], [place] Pregnant women 18 years of age or older with a Medicaid card  °[facility] For children 0 - 36 years of age:  °• Cleaning °• Tooth brushing/flossing instruction °• Sealants, fillings, crowns °• Extractions °• Emergency treatment °Limited orthodontic services for patients with Medicaid [phone] °[address] °, [place] [address] Medicaid and [place] Health Choice cover for children up to age 36 and pregnant women.  Parents of children up to age 36 without Medicaid pay a reduced fee at time of service.  °[facility] [place] For children 0 - 36 years of age:  °• Cleaning °• Tooth brushing/flossing instruction °• Sealants, fillings, crowns °• Extractions °• Emergency treatment °Limited orthodontic services for patients with Medicaid  [phone] °[address] °[place], [place].  Medicaid and [place] Health Choice cover for children up to age 36 and pregnant women.  Parents of children up to age 36 without Medicaid pay a reduced fee.  °[facility] • Cleaning °• Sealants, fillings, crowns °• Extractions ° °Hours: Tuesdays and Thursdays, 4:15 - 8 pm [phone] °[address] °[place], [place] [address] Services free of charge to [place] residents ages 18-64 who do not have health insurance, Medicare, Medicaid, or VA benefits and fall within federal poverty guidelines  °[facility] ° ° ° Provides dental care in addition to primary medical care, nutritional counseling, and pharmacy: °• Cleaning °• Sealants, fillings, crowns °• Extractions ° ° ° ° ° ° ° ° ° ° ° ° ° ° ° ° ° [phone] °[facility], [address] °[place], [place] ° °[phone] °[facility], [address] [place], [place] ° °[phone] °[facility] °[place], [place] ° °[phone] °[facility], [address] °[place], [place] ° °[phone] °[facility] °[address] °[place], [place] Accepts Medicaid, Medicare, most insurance.  Also provides services available to all with fees adjusted based on ability to pay.    °[facility] • Cleaning °• Tooth brushing/flossing instruction °• Sealants, fillings, crowns °• Extractions °• Emergency treatment °Hours: Tuesdays, Thursdays, and Fridays from 8 am to 5 pm by appointment only. [phone] °[address] °[place], [place] [address] [place] residents with Medicaid (depending on eligibility) and children with  Health Choice - call for more information.  °  [facility] • Extractions only ° °Hours: 2nd and 4th Thursday of each month from 6:30 am - 9 am.   [phone] ext. [phone] °[address] °[place], [place] [address] Ages 18 and older only.  Patients are seen on a first come, first served basis.  °[facility] • Cleanings °• Fillings °• Extractions °• Orthodontics °• Endodontics °• Implants/Crowns/Bridges °• Complete and partial dentures [phone] °[place], [place] Patients must complete an application for services.  There is often a waiting list.   ° °

## 2015-11-16 NOTE — ED Provider Notes (Signed)
CSN: [id number]     Arrival date & time 11/15/15  2220 History  By signing my name below, I, Rebecca [name], attest that this documentation has been prepared under the direction and in the presence of Salwa Bai, MD. Electronically Signed: Budd PalmerVanessa [name], ED Scribe. 11/16/2015. 1:07 AM.      Chief Complaint  Patient presents with  . Dental Pain   Patient is a 35 y.o. female presenting with tooth pain. The history is provided by the patient. No language interpreter was used.  Dental Pain Location:  Upper Upper teeth location:  2/RU 2nd molar, 3/RU 1st molar, 4/RU 2nd bicuspid and 5/RU 1st bicuspid Quality:  Burning and throbbing Severity:  Moderate Onset quality:  Gradual Timing:  Constant Progression:  Worsening Chronicity:  New Context: dental caries, dental fracture and poor dentition   Relieved by:  Nothing Worsened by:  Nothing tried Ineffective treatments:  NSAIDs Associated symptoms: facial pain and headaches   Associated symptoms: no trismus   Headaches:    Severity:  Moderate   Onset quality:  Gradual   Timing:  Constant   Progression:  Unchanged   Chronicity:  New Risk factors: smoking    HPI Comments: Rebecca [name] is a 35 y.o. female smoker at 0.5 ppd with a PMHx of drug abuse who presents to the Emergency Department complaining of constant, burning, throbbing, left-upper dental pain onset several days ago. She reports associated frontal headache, nausea, and vomiting (2x yesterday, 3x today). She has taken ibuprofen for this without relief.    She also c/o boils on both lower arms. She notes a PMHx of the same.   Past Medical History  Diagnosis Date  . MRSA (methicillin resistant staph aureus) culture positive   . Anxiety     currently no meds  . Pregnancy induced hypertension     first preg  . Drug abuse, opioid type     [facility]  . Pregnant   . Kidney stone   . Kidney stone   . Mental disorder 04/2011     states was advised for detox but couldnt due to 1st pregnancy   Past Surgical History  Procedure Laterality Date  . Wisdom tooth extraction  1995   Family History  Problem Relation Age of Onset  . Hypertension Mother    Social History  Substance Use Topics  . Smoking status: Current Every Day Smoker -- 0.50 packs/day for 12 years    Types: Cigarettes  . Smokeless tobacco: Never Used  . Alcohol Use: No   OB History    Gravida Para Term Preterm AB TAB SAB Ectopic Multiple Living   3 3 3  0 0 0 0 0 0 3     Review of Systems  HENT: Positive for dental problem.   Neurological: Positive for headaches.  All other systems reviewed and are negative.   Allergies  Citalopram hydrobromide; Propoxyphene n-acetaminophen; and Tramadol  Home Medications   Prior to Admission medications   Medication Sig Start Date End Date Taking? Authorizing Provider  amLODipine (NORVASC) 10 MG tablet Take 1 tablet (10 mg total) by mouth daily. Patient not taking: Reported on 03/29/2015 03/05/13   Nani RavensAndrew M Wight, MD  azithromycin (ZITHROMAX Z-PAK) 250 MG tablet 2 po day one, then 1 daily x 4 days Patient not taking: Reported on 03/29/2015 11/20/14   Geoffery Lyonsouglas Delo, MD  benzonatate (TESSALON) 100 MG capsule Take 1 capsule (100 mg total) by mouth every 8 (eight) hours.  Patient not taking: Reported on 03/29/2015 11/20/14   Geoffery Lyons, MD  butalbital-acetaminophen-caffeine (FIORICET, ESGIC) [phone] MG per tablet Take 1 tablet by mouth 2 (two) times daily as needed for headache. Patient not taking: Reported on 03/29/2015 04/14/13   Currie Paris, NP  famotidine (PEPCID) 20 MG tablet Take 1 tablet (20 mg total) by mouth 2 (two) times daily. Patient not taking: Reported on 03/29/2015 02/20/14   Gilda Crease, MD  FLUoxetine (PROZAC) 20 MG capsule Take 1 capsule (20 mg total) by mouth daily. Patient not taking: Reported on 03/29/2015 03/28/13   Adam Phenix, MD  hydrochlorothiazide (HYDRODIURIL) 25 MG  tablet Take 1 tablet (25 mg total) by mouth daily. Patient not taking: Reported on 03/29/2015 04/09/13   Minta Balsam, MD  ibuprofen (ADVIL,MOTRIN) 600 MG tablet Take 1 tablet (600 mg total) by mouth every 6 (six) hours. Patient not taking: Reported on 03/29/2015 03/05/13   Nani Ravens, MD  ondansetron (ZOFRAN) 4 MG tablet Take 1 tablet (4 mg total) by mouth every 6 (six) hours. Patient not taking: Reported on 03/29/2015 02/20/14   Gilda Crease, MD  oxyCODONE-acetaminophen (ROXICET) 5-325 MG per tablet Take 1 tablet by mouth every 4 (four) hours as needed for pain. Patient not taking: Reported on 03/29/2015 04/09/13   Minta Balsam, MD  phenazopyridine (PYRIDIUM) 200 MG tablet Take 1 tablet (200 mg total) by mouth 3 (three) times daily. 03/29/15   Nelva Nay, MD  QUEtiapine (SEROQUEL) 100 MG tablet Take 100 mg by mouth at bedtime.    Historical Provider, MD  sucralfate (CARAFATE) 1 GM/10ML suspension Take 10 mLs (1 g total) by mouth 4 (four) times daily -  with meals and at bedtime. Patient not taking: Reported on 03/29/2015 02/20/14   Gilda Crease, MD  sulfamethoxazole-trimethoprim (BACTRIM DS) 800-160 MG per tablet Take 1 tablet by mouth 2 (two) times daily. Patient not taking: Reported on 03/29/2015 04/14/13   Lesly Dukes, MD   BP 154/107 mmHg  Pulse 98  Temp(Src) 99.8 F (37.7 C) (Oral)  Resp 16  Ht  (1.727 m)  Wt 203 lb (92.08 kg)  BMI 30.87 kg/m2  SpO2 100%  LMP 10/27/2015 Physical Exam  Constitutional: She is oriented to person, place, and time. She appears well-developed and well-nourished.  HENT:  Head: Normocephalic and atraumatic.  Mouth/Throat: Oropharynx is clear and moist.  Cavities in upper lateral incisor, premolar, 1st and 2nd molars, Left lower molars also have cavities,   Eyes: Conjunctivae are normal. Pupils are equal, round, and reactive to light. Right eye exhibits no discharge. Left eye exhibits no discharge.  Neck: Normal range of motion.  Neck supple.  No trimsus  Cardiovascular: Normal rate, regular rhythm and normal heart sounds.   Pulmonary/Chest: Effort normal and breath sounds normal. No respiratory distress. She has no wheezes. She has no rales.  Abdominal: Soft. Bowel sounds are normal. There is no tenderness. There is no rebound and no guarding.  Musculoskeletal: Normal range of motion.  Lymphadenopathy:    She has no cervical adenopathy.  Neurological: She is alert and oriented to person, place, and time. She has normal reflexes. Coordination normal.  Skin: Skin is warm and dry. No rash noted. She is not diaphoretic. No erythema.  .5  cm x cluster of 3 0.05cm areas of erythema from IVDA injection sites on the volar surfaces of both arms with 3 puncture wounds on the left, and one on the right  Psychiatric: She  has a normal mood and affect.  Nursing note and vitals reviewed.   ED Course  Procedures  DIAGNOSTIC STUDIES: Oxygen Saturation is 100% on RA, normal by my interpretation.    COORDINATION OF CARE: 12:59 AM - Discussed plans to order 300 mg clindamycin, naproxen, and a topical numbing agent. Pt advised of plan for treatment and pt agrees.  Labs Review Labs Reviewed - No data to display  Imaging Review No results found. I have personally reviewed and evaluated these images and lab results as part of my medical decision-making.   EKG Interpretation None      MDM   Final diagnoses:  None   Given the presentation and here with another patient and skin lesions this represents drug seeking.   frequently seen for pain complaints and is here with another patient with pain complaints.  Has a h/o opioid abuse and diversion.  The purple lesion on the volar forearms are consistent with injection sites. We do not prescribe narcotics for dental pain clindamycin and peridex.   Follow up I  I personally performed the services described in this documentation, which was scribed in my presence. The recorded  information has been reviewed and is accurate.     Cy Blamer, MD 11/16/15 [phone]

## 2015-11-16 NOTE — ED Notes (Signed)
Call[facility] pending pt to call back.

## 2019-08-20 ENCOUNTER — Encounter (HOSPITAL_COMMUNITY): Payer: Self-pay

## 2019-08-20 ENCOUNTER — Other Ambulatory Visit: Payer: Self-pay

## 2019-08-20 DIAGNOSIS — R109 Unspecified abdominal pain: Secondary | ICD-10-CM | POA: Insufficient documentation

## 2019-08-20 DIAGNOSIS — J45909 Unspecified asthma, uncomplicated: Secondary | ICD-10-CM | POA: Insufficient documentation

## 2019-08-20 DIAGNOSIS — Z79899 Other long term (current) drug therapy: Secondary | ICD-10-CM | POA: Insufficient documentation

## 2019-08-20 DIAGNOSIS — I10 Essential (primary) hypertension: Secondary | ICD-10-CM | POA: Insufficient documentation

## 2019-08-20 DIAGNOSIS — F17210 Nicotine dependence, cigarettes, uncomplicated: Secondary | ICD-10-CM | POA: Insufficient documentation

## 2019-08-20 NOTE — ED Triage Notes (Signed)
Pt BIB [facility]. Pt reports R flank pain. She reports a hx of kidney stones. Denies hematuria, but reports that she feels that she cannot empty her bladder.

## 2019-08-21 ENCOUNTER — Encounter (HOSPITAL_COMMUNITY): Payer: Self-pay | Admitting: Emergency Medicine

## 2019-08-21 ENCOUNTER — Emergency Department (HOSPITAL_COMMUNITY): Payer: Self-pay

## 2019-08-21 ENCOUNTER — Emergency Department (HOSPITAL_COMMUNITY)
Admission: EM | Admit: 2019-08-21 | Discharge: 2019-08-21 | Disposition: A | Discharge status: Home / Self Care | Payer: Self-pay | Attending: Emergency Medicine | Admitting: Emergency Medicine

## 2019-08-21 DIAGNOSIS — R109 Unspecified abdominal pain: Secondary | ICD-10-CM

## 2019-08-21 LAB — URINALYSIS, ROUTINE W REFLEX MICROSCOPIC
Bilirubin Urine: NEGATIVE
Glucose, UA: NEGATIVE mg/dL
Hgb urine dipstick: NEGATIVE
Ketones, ur: NEGATIVE mg/dL
Leukocytes,Ua: NEGATIVE
Nitrite: NEGATIVE
Protein, ur: NEGATIVE mg/dL
Specific Gravity, Urine: 1.02 (ref 1.005–1.030)
pH: 6 (ref 5.0–8.0)

## 2019-08-21 LAB — CBC
HCT: 38.7 % (ref 36.0–46.0)
Hemoglobin: 12.1 g/dL (ref 12.0–15.0)
MCH: 28.7 pg (ref 26.0–34.0)
MCHC: 31.3 g/dL (ref 30.0–36.0)
MCV: 91.9 fL (ref 80.0–100.0)
Platelets: 250 10*3/uL (ref 150–400)
RBC: 4.21 MIL/uL (ref 3.87–5.11)
RDW: 14.4 % (ref 11.5–15.5)
WBC: 10.7 10*3/uL — ABNORMAL HIGH (ref 4.0–10.5)
nRBC: 0 % (ref 0.0–0.2)

## 2019-08-21 LAB — BASIC METABOLIC PANEL
Anion gap: 11 (ref 5–15)
BUN: 10 mg/dL (ref 6–20)
CO2: 24 mmol/L (ref 22–32)
Calcium: 9.2 mg/dL (ref 8.9–10.3)
Chloride: 104 mmol/L (ref 98–111)
Creatinine, Ser: 0.73 mg/dL (ref 0.44–1.00)
GFR calc Af Amer: >60 mL/min (ref >60)
GFR calc non Af Amer: >60 mL/min (ref >60)
Glucose, Bld: 139 mg/dL — ABNORMAL HIGH (ref 70–99)
Potassium: 3.4 mmol/L — ABNORMAL LOW (ref 3.5–5.1)
Sodium: 139 mmol/L (ref 135–145)

## 2019-08-21 LAB — I-STAT BETA HCG BLOOD, ED (MC, WL, AP ONLY): I-stat hCG, quantitative: <5 m[IU]/mL (ref <5)

## 2019-08-21 MED ORDER — KETOROLAC TROMETHAMINE 15 MG/ML IJ SOLN
15.0000 mg | Freq: Once | INTRAMUSCULAR | Status: AC
  Administered 2019-08-21: 15 mg via INTRAVENOUS
  Filled 2019-08-21: qty 1

## 2019-08-21 MED ORDER — DROPERIDOL 2.5 MG/ML IJ SOLN
1.2500 mg | Freq: Once | INTRAMUSCULAR | Status: AC
Start: 2019-08-21 — End: 2019-08-21
  Administered 2019-08-21: 1.25 mg via INTRAVENOUS
  Filled 2019-08-21: qty 2

## 2019-08-21 NOTE — ED Provider Notes (Signed)
[place] DEPT Provider Note  CSN: [id number] Arrival date & time: 08/20/19 2152  Chief Complaint(s) Flank Pain  HPI Rebecca [name] is a 38 y.o. female    Flank Pain This is a recurrent problem. Episode onset: 1 week. The problem occurs constantly. The problem has not changed since onset.Pertinent negatives include no chest pain, no abdominal pain, no headaches and no shortness of breath. The symptoms are aggravated by bending and twisting. Nothing relieves the symptoms. She has tried acetaminophen (motrin) for the symptoms. The treatment provided mild relief.    Past Medical History Past Medical History:  Diagnosis Date  . Anxiety    currently no meds  . Drug abuse, opioid type [facility])    released from [facility] for falsifying prescriptions  . Kidney stone   . Kidney stone   . Mental disorder 04/2011   states was advised for detox but couldnt due to 1st pregnancy  . MRSA (methicillin resistant staph aureus) culture positive   . Pregnancy induced hypertension    first preg  . Pregnant    Patient Active Problem List   Diagnosis Date Noted  . Severe Preeclampsia, postpartum 04/08/2013  . GBS (group B streptococcus) UTI complicating pregnancy 45/80/9983  . Pain in joint, ankle and foot 11/22/2012  . Supervision of high-risk pregnancy 11/19/2012  . Hypertension 11/19/2012  . Severe Preeclampsia, antepartum 11/19/2012  . Pregnancy, high-risk, obstetrical care insufficient 11/19/2012  . Desires sterilization 11/19/2012  . Opioid dependence ([place]) 05/09/2011  . BACK PAIN, LUMBAR, CHRONIC 10/22/2008  . WART, LEFT HAND 01/28/2008  . POLYCYSTIC OVARIES 01/01/2008  . MUSCLE PAIN 12/22/2007  . ANXIETY 12/14/2007  . KNEE SPRAIN, RIGHT 10/11/2007  . NECK PAIN 08/31/2007  . LEG PAIN, RIGHT 07/27/2007  . DRUG WITHDRAWAL 07/17/2007  . ABSCESS, [place] 06/23/2007  . HEADACHE 06/11/2007  . ASTHMA NOS W/ACUTE EXACERBATION 05/08/2007  . TOBACCO ABUSE  04/10/2007  . RHINITIS, ALLERGIC NOS 04/10/2007  . DEPRESSION 03/29/2007   Home Medication(s) Prior to Admission medications   Medication Sig Start Date End Date Taking? Authorizing Provider  gabapentin (NEURONTIN) 800 MG tablet Take 800 mg by mouth 2 (two) times daily.   Yes [provider]  amLODipine (NORVASC) 10 MG tablet Take 1 tablet (10 mg total) by mouth daily. Patient not taking: Reported on 03/29/2015 03/05/13   Leone Brand, MD  azithromycin (ZITHROMAX Z-PAK) 250 MG tablet 2 po day one, then 1 daily x 4 days Patient not taking: Reported on 03/29/2015 11/20/14   Veryl Speak, MD  benzonatate (TESSALON) 100 MG capsule Take 1 capsule (100 mg total) by mouth every 8 (eight) hours. Patient not taking: Reported on 03/29/2015 11/20/14   Veryl Speak, MD  butalbital-acetaminophen-caffeine (FIORICET, ESGIC) [phone] MG per tablet Take 1 tablet by mouth 2 (two) times daily as needed for headache. Patient not taking: Reported on 03/29/2015 04/14/13   Virginia Rochester, NP  chlorhexidine (PERIDEX) 0.12 % solution Use as directed 15 mLs in the mouth or throat 2 (two) times daily. Patient not taking: Reported on 08/21/2019 11/16/15   Palumbo, April, MD  clindamycin (CLEOCIN) 300 MG capsule Take 1 capsule (300 mg total) by mouth 4 (four) times daily. X 7 days Patient not taking: Reported on 08/21/2019 11/16/15   Palumbo, April, MD  famotidine (PEPCID) 20 MG tablet Take 1 tablet (20 mg total) by mouth 2 (two) times daily. Patient not taking: Reported on 03/29/2015 02/20/14   Orpah Greek, MD  FLUoxetine (PROZAC) 20  MG capsule Take 1 capsule (20 mg total) by mouth daily. Patient not taking: Reported on 03/29/2015 03/28/13   Adam Phenix, MD  hydrochlorothiazide (HYDRODIURIL) 25 MG tablet Take 1 tablet (25 mg total) by mouth daily. Patient not taking: Reported on 03/29/2015 04/09/13   Minta Balsam, MD  ibuprofen (ADVIL,MOTRIN) 600 MG tablet Take 1 tablet (600 mg total) by mouth every  6 (six) hours. Patient not taking: Reported on 03/29/2015 03/05/13   Nani Ravens, MD  ondansetron [facility] ODT) 8 MG disintegrating tablet 8mg  ODT q8 hours prn nausea Patient not taking: Reported on 08/21/2019 11/16/15   Palumbo, April, MD  ondansetron (ZOFRAN) 4 MG tablet Take 1 tablet (4 mg total) by mouth every 6 (six) hours. Patient not taking: Reported on 03/29/2015 02/20/14   02/22/14, MD  oxyCODONE-acetaminophen (ROXICET) 5-325 MG per tablet Take 1 tablet by mouth every 4 (four) hours as needed for pain. Patient not taking: Reported on 03/29/2015 04/09/13   06/09/13, MD  phenazopyridine (PYRIDIUM) 200 MG tablet Take 1 tablet (200 mg total) by mouth 3 (three) times daily. Patient not taking: Reported on 08/21/2019 03/29/15   03/31/15, MD  sucralfate (CARAFATE) 1 GM/10ML suspension Take 10 mLs (1 g total) by mouth 4 (four) times daily -  with meals and at bedtime. Patient not taking: Reported on 03/29/2015 02/20/14   02/22/14, MD  sulfamethoxazole-trimethoprim (BACTRIM DS) 800-160 MG per tablet Take 1 tablet by mouth 2 (two) times daily. Patient not taking: Reported on 03/29/2015 04/14/13   04/16/13, MD                                                                                                                                    Past Surgical History Past Surgical History:  Procedure Laterality Date  . WISDOM TOOTH EXTRACTION  1995   Family History Family History  Problem Relation Age of Onset  . Hypertension Mother     Social History Social History   Tobacco Use  . Smoking status: Current Every Day Smoker    Packs/day: 0.50    Years: 12.00    Pack years: 6.00    Types: Cigarettes  . Smokeless tobacco: Never Used  Substance Use Topics  . Alcohol use: No  . Drug use: No    Comment: denies    Allergies Citalopram hydrobromide, Propoxyphene n-acetaminophen, and Tramadol  Review of Systems Review of Systems  Respiratory:  Negative for shortness of breath.   Cardiovascular: Negative for chest pain.  Gastrointestinal: Negative for abdominal pain, nausea and vomiting.  Genitourinary: Positive for flank pain. Negative for frequency and urgency.  Neurological: Negative for headaches.   All other systems are reviewed and are negative for acute change except as noted in the HPI  Physical Exam Vital Signs  I have reviewed the triage vital signs BP (!) 151/93 (BP Location: Right Arm)  Pulse 89   Temp 98.3 F (36.8 C) (Oral)   Resp 18   SpO2 98%   Physical Exam Vitals reviewed.  Constitutional:      General: She is not in acute distress.    Appearance: She is well-developed. She is not diaphoretic.  HENT:     Head: Normocephalic and atraumatic.     Right Ear: External ear normal.     Left Ear: External ear normal.     Nose: Nose normal.  Eyes:     General: No scleral icterus.    Conjunctiva/sclera: Conjunctivae normal.  Neck:     Trachea: Phonation normal.  Cardiovascular:     Rate and Rhythm: Normal rate and regular rhythm.  Pulmonary:     Effort: Pulmonary effort is normal. No respiratory distress.     Breath sounds: No stridor.  Abdominal:     General: There is no distension.     Tenderness: There is abdominal tenderness (to right lateral abd wall). There is no right CVA tenderness, left CVA tenderness, guarding or rebound. Negative signs include Murphy's sign, Rovsing's sign and McBurney's sign.    Musculoskeletal:        General: Normal range of motion.     Cervical back: Normal range of motion.  Neurological:     Mental Status: She is alert and oriented to person, place, and time.  Psychiatric:        Behavior: Behavior normal.     ED Results and Treatments Labs (all labs ordered are listed, but only abnormal results are displayed) Labs Reviewed  URINALYSIS, ROUTINE W REFLEX MICROSCOPIC - Abnormal; Notable for the following components:      Result Value   APPearance CLOUDY (*)      All other components within normal limits  BASIC METABOLIC PANEL - Abnormal; Notable for the following components:   Potassium 3.4 (*)    Glucose, Bld 139 (*)    All other components within normal limits  CBC - Abnormal; Notable for the following components:   WBC 10.7 (*)    All other components within normal limits  I-STAT BETA HCG BLOOD, ED ([facility], [facility], [facility] ONLY)                                                                                                                         EKG  EKG Interpretation  Date/Time:    Ventricular Rate:    PR Interval:    QRS Duration:   QT Interval:    QTC Calculation:   R Axis:     Text Interpretation:        Radiology CT Renal Stone Study  Result Date: 08/21/2019 CLINICAL DATA:  Right flank pain EXAM: CT ABDOMEN AND PELVIS WITHOUT CONTRAST TECHNIQUE: Multidetector CT imaging of the abdomen and pelvis was performed following the standard protocol without IV contrast. COMPARISON:  CT abdomen pelvis 03/29/2015 FINDINGS: LOWER CHEST: No basilar pleural or apical pericardial effusion. HEPATOBILIARY: Normal hepatic contours. No intra-  or extrahepatic biliary dilatation. Normal gallbladder. PANCREAS: Normal pancreas. No ductal dilatation or peripancreatic fluid collection. SPLEEN: Normal. ADRENALS/URINARY TRACT: --Adrenal glands: Normal. --Kidneys and ureters: No hydronephrosis, nephroureterolithiasis or solid renal mass. --Urinary bladder: Normal for degree of distention STOMACH/BOWEL: --Stomach/Duodenum: No hiatal hernia. Normal duodenal course and caliber. --Small bowel: No dilatation or inflammation. --Colon: No focal abnormality. --Appendix: Normal. VASCULAR/LYMPHATIC: Normal course and caliber of the major abdominal vessels. No abdominal or pelvic lymphadenopathy. REPRODUCTIVE: Normal uterus and ovaries. MUSCULOSKELETAL. No bony spinal canal stenosis or focal osseous abnormality. OTHER: None. IMPRESSION: No acute abdominal or pelvic abnormality.  Electronically Signed   By: Deatra Robinson M.D.   On: 08/21/2019 02:31    Pertinent labs & imaging results that were available during my care of the patient were reviewed by me and considered in my medical decision making (see chart for details).  Medications Ordered in ED Medications  ketorolac (TORADOL) 15 MG/ML injection 15 mg (15 mg Intravenous Given 08/21/19 0142)  droperidol (INAPSINE) 2.5 MG/ML injection 1.25 mg (1.25 mg Intravenous Given 08/21/19 0257)                                                                                                                                    Procedures Procedures  (including critical care time)  Medical Decision Making / ED Course I have reviewed the nursing notes for this encounter and the patient's prior records (if available in EHR or on provided paperwork).   KIERRA JEZEWSKI was evaluated in Emergency Department on 08/21/2019 for the symptoms described in the history of present illness. She was evaluated in the context of the global COVID-19 pandemic, which necessitated consideration that the patient might be at risk for infection with the SARS-CoV-2 virus that causes COVID-19. Institutional protocols and algorithms that pertain to the evaluation of patients at risk for COVID-19 are in a state of rapid change based on information released by regulatory bodies including the CDC and federal and state organizations. These policies and algorithms were followed during the patient's care in the ED.  Work up reassuring w/o evidence of serious intraabdominal inflammatory/infectious process, SBO, UTI/Pyelo. HCG negative. Tolerating oral intake.  The patient appears reasonably screened and/or stabilized for discharge and I doubt any other medical condition or other [facility] requiring further screening, evaluation, or treatment in the ED at this time prior to discharge.  The patient is safe for discharge with strict return precautions.       Final  Clinical Impression(s) / ED Diagnoses Final diagnoses:  Right flank pain     The patient appears reasonably screened and/or stabilized for discharge and I doubt any other medical condition or other [facility] requiring further screening, evaluation, or treatment in the ED at this time prior to discharge.  Disposition: Discharge  Condition: Good  I have discussed the results, Dx and Tx plan with the patient who expressed understanding and agree(s) with the plan. Discharge  instructions discussed at great length. The patient was given strict return precautions who verbalized understanding of the instructions. No further questions at time of discharge.    ED Discharge Orders    None      Follow Up: Stevphen RochesterLebauer, Eugene, MD [address], [address] [place] [address] [phone]  Schedule an appointment as soon as possible for a visit  As needed     This chart was dictated using voice recognition software.  Despite best efforts to proofread,  errors can occur which can change the documentation meaning.   Nira Connardama, Jailon Schaible Eduardo, MD 08/21/19 [phone]

## 2020-03-24 ENCOUNTER — Emergency Department (HOSPITAL_COMMUNITY): Payer: Self-pay

## 2020-03-24 ENCOUNTER — Encounter (HOSPITAL_COMMUNITY): Payer: Self-pay | Admitting: Emergency Medicine

## 2020-03-24 ENCOUNTER — Other Ambulatory Visit: Payer: Self-pay

## 2020-03-24 ENCOUNTER — Emergency Department (HOSPITAL_COMMUNITY)
Admission: EM | Admit: 2020-03-24 | Discharge: 2020-03-24 | Disposition: A | Discharge status: Home / Self Care | Payer: Self-pay | Attending: Emergency Medicine | Admitting: Emergency Medicine

## 2020-03-24 DIAGNOSIS — Z79899 Other long term (current) drug therapy: Secondary | ICD-10-CM | POA: Insufficient documentation

## 2020-03-24 DIAGNOSIS — F17210 Nicotine dependence, cigarettes, uncomplicated: Secondary | ICD-10-CM | POA: Insufficient documentation

## 2020-03-24 DIAGNOSIS — M79672 Pain in left foot: Secondary | ICD-10-CM | POA: Insufficient documentation

## 2020-03-24 DIAGNOSIS — I10 Essential (primary) hypertension: Secondary | ICD-10-CM | POA: Insufficient documentation

## 2020-03-24 DIAGNOSIS — J45909 Unspecified asthma, uncomplicated: Secondary | ICD-10-CM | POA: Insufficient documentation

## 2020-03-24 DIAGNOSIS — R2242 Localized swelling, mass and lump, left lower limb: Secondary | ICD-10-CM | POA: Insufficient documentation

## 2020-03-24 NOTE — ED Provider Notes (Signed)
[facility] EMERGENCY DEPARTMENT Provider Note   CSN: [id number] Arrival date & time: 03/24/20  0302   History Chief Complaint  Patient presents with  . Leg Swelling    left leg pain    Rebecca [name] is a 39 y.o. female.  The history is provided by the patient.  And she has a history of hypertension and comes in complaining of pain in her left foot for the last 3 days.  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  Her pain is in the entire left foot and radiates up into the lower leg.  She rates pain at 10/10.  Pain is worse with weightbearing, especially when she steps on her heel.  She denies any specific trauma.  She has taken ibuprofen for pain without relief.  Past Medical History:  Diagnosis Date  . Anxiety    currently no meds  . Drug abuse, opioid type [facility])    released from [facility] for falsifying prescriptions  . Kidney stone   . Kidney stone   . Mental disorder 04/2011   states was advised for detox but couldnt due to 1st pregnancy  . MRSA (methicillin resistant staph aureus) culture positive   . Pregnancy induced hypertension    first preg  . Pregnant     Patient Active Problem List   Diagnosis Date Noted  . Severe Preeclampsia, postpartum 04/08/2013  . GBS (group B streptococcus) UTI complicating pregnancy 11/23/2012  . Pain in joint, ankle and foot 11/22/2012  . Supervision of high-risk pregnancy 11/19/2012  . Hypertension 11/19/2012  . Severe Preeclampsia, antepartum 11/19/2012  . Pregnancy, high-risk, obstetrical care insufficient 11/19/2012  . Desires sterilization 11/19/2012  . Opioid dependence ([facility]) 05/09/2011  . BACK PAIN, LUMBAR, CHRONIC 10/22/2008  . WART, LEFT HAND 01/28/2008  . POLYCYSTIC OVARIES 01/01/2008  . MUSCLE PAIN 12/22/2007  . ANXIETY 12/14/2007  . KNEE SPRAIN, RIGHT 10/11/2007  . NECK PAIN  08/31/2007  . LEG PAIN, RIGHT 07/27/2007  . DRUG WITHDRAWAL 07/17/2007  . ABSCESS, FACE 06/23/2007  . HEADACHE 06/11/2007  . ASTHMA NOS W/ACUTE EXACERBATION 05/08/2007  . TOBACCO ABUSE 04/10/2007  . RHINITIS, ALLERGIC NOS 04/10/2007  . DEPRESSION 03/29/2007    Past Surgical History:  Procedure Laterality Date  . WISDOM TOOTH EXTRACTION  1995     OB History    Gravida  3   Para  3   Term  3   Preterm  0   AB  0   Living  3     SAB  0   TAB  0   Ectopic  0   Multiple  0   Live Births  3           Family History  Problem Relation Age of Onset  . Hypertension Mother     Social History   Tobacco Use  . Smoking status: Current Every Day Smoker    Packs/day: 0.50    Years: 12.00    Pack years: 6.00    Types: Cigarettes  . Smokeless tobacco: Never Used  Substance Use Topics  . Alcohol use: No  . Drug use: No    Comment: denies     Home Medications Prior to Admission medications   Medication Sig Start Date End Date Taking? Authorizing Provider  amLODipine (NORVASC) 10 MG tablet Take 1 tablet (  10 mg total) by mouth daily. Patient not taking: Reported on 03/29/2015 03/05/13   Nani Ravens, MD  azithromycin (ZITHROMAX Z-PAK) 250 MG tablet 2 po day one, then 1 daily x 4 days Patient not taking: Reported on 03/29/2015 11/20/14   Geoffery Lyons, MD  benzonatate (TESSALON) 100 MG capsule Take 1 capsule (100 mg total) by mouth every 8 (eight) hours. Patient not taking: Reported on 03/29/2015 11/20/14   Geoffery Lyons, MD  butalbital-acetaminophen-caffeine (FIORICET, ESGIC) [phone] MG per tablet Take 1 tablet by mouth 2 (two) times daily as needed for headache. Patient not taking: Reported on 03/29/2015 04/14/13   Currie Paris, NP  chlorhexidine (PERIDEX) 0.12 % solution Use as directed 15 mLs in the mouth or throat 2 (two) times daily. Patient not taking: Reported on 08/21/2019 11/16/15   Palumbo, April, MD  clindamycin (CLEOCIN) 300 MG capsule Take 1  capsule (300 mg total) by mouth 4 (four) times daily. X 7 days Patient not taking: Reported on 08/21/2019 11/16/15   Palumbo, April, MD  famotidine (PEPCID) 20 MG tablet Take 1 tablet (20 mg total) by mouth 2 (two) times daily. Patient not taking: Reported on 03/29/2015 02/20/14   Gilda Crease, MD  FLUoxetine (PROZAC) 20 MG capsule Take 1 capsule (20 mg total) by mouth daily. Patient not taking: Reported on 03/29/2015 03/28/13   Adam Phenix, MD  gabapentin (NEURONTIN) 800 MG tablet Take 800 mg by mouth 2 (two) times daily.    [provider]  hydrochlorothiazide (HYDRODIURIL) 25 MG tablet Take 1 tablet (25 mg total) by mouth daily. Patient not taking: Reported on 03/29/2015 04/09/13   Minta Balsam, MD  ibuprofen (ADVIL,MOTRIN) 600 MG tablet Take 1 tablet (600 mg total) by mouth every 6 (six) hours. Patient not taking: Reported on 03/29/2015 03/05/13   Nani Ravens, MD  ondansetron [facility] ODT) 8 MG disintegrating tablet 8mg  ODT q8 hours prn nausea Patient not taking: Reported on 08/21/2019 11/16/15   Palumbo, April, MD  ondansetron (ZOFRAN) 4 MG tablet Take 1 tablet (4 mg total) by mouth every 6 (six) hours. Patient not taking: Reported on 03/29/2015 02/20/14   02/22/14, MD  oxyCODONE-acetaminophen (ROXICET) 5-325 MG per tablet Take 1 tablet by mouth every 4 (four) hours as needed for pain. Patient not taking: Reported on 03/29/2015 04/09/13   06/09/13, MD  phenazopyridine (PYRIDIUM) 200 MG tablet Take 1 tablet (200 mg total) by mouth 3 (three) times daily. Patient not taking: Reported on 08/21/2019 03/29/15   03/31/15, MD  sucralfate (CARAFATE) 1 GM/10ML suspension Take 10 mLs (1 g total) by mouth 4 (four) times daily -  with meals and at bedtime. Patient not taking: Reported on 03/29/2015 02/20/14   02/22/14, MD  sulfamethoxazole-trimethoprim (BACTRIM DS) 800-160 MG per tablet Take 1 tablet by mouth 2 (two) times daily. Patient not taking:  Reported on 03/29/2015 04/14/13   04/16/13, MD    Allergies    Citalopram hydrobromide, Propoxyphene n-acetaminophen, and Tramadol  Review of Systems   Review of Systems  All other systems reviewed and are negative.   Physical Exam Updated Vital Signs BP (!) 154/106 (BP Location: Left Arm)   Pulse 95   Temp 98.7 F (37.1 C) (Oral)   Resp 18   Ht 5\' 8"  (1.727 m)   Wt 80.7 kg   LMP 03/08/2020 (Approximate)   SpO2 100%   BMI 27.06 kg/m   Physical Exam Vitals and nursing note  reviewed.   39 year old female, resting comfortably and in no acute distress. Vital signs are significant for elevated blood pressure. Oxygen saturation is 100%, which is normal. Head is normocephalic and atraumatic. PERRLA, EOMI. Oropharynx is clear. Neck is nontender and supple without adenopathy or JVD. Back is nontender and there is no CVA tenderness. Lungs are clear without rales, wheezes, or rhonchi. Chest is nontender. Heart has regular rate and rhythm without murmur. Abdomen is soft, flat, nontender without masses or hepatosplenomegaly and peristalsis is normoactive. Extremities: There is mild swelling of the left midfoot with tenderness to palpation in that same area.  Calf circumference is symmetric.  No cords are palpable and Denna Haggard' sign is negative.  Remainder of extremity exam is normal. Skin is warm and dry without rash. Neurologic: Mental status is normal, cranial nerves are intact, there are no motor or sensory deficits.  ED Results / Procedures / Treatments    Radiology DG Foot Complete Left  Result Date: 03/24/2020 CLINICAL DATA:  Left foot pain and swelling with no known injury. Blister over the heel. EXAM: LEFT FOOT - COMPLETE 3+ VIEW COMPARISON:  04/24/2009 FINDINGS: There is no evidence of fracture or dislocation. Heel spur which has increased in size from before but remains well corticated. Negative for erosion or joint space narrowing. IMPRESSION: No acute or erosive  finding. Electronically Signed   By: Marnee Spring M.D.   On: 03/24/2020 04:42    Procedures Procedures   Medications Ordered in ED Medications - No data to display  ED Course  I have reviewed the triage vital signs and the nursing notes.  Pertinent imaging results that were available during my care of the patient were reviewed by me and considered in my medical decision making (see chart for details).  MDM Rules/Calculators/A&P Left foot pain of uncertain cause.  Foot x-rays are obtained and show no acute injury or in no acute process.  Old records are reviewed, and she did have an ED visit for leg pain at which time she was also having delusions which sounds somewhat similar to what she is describing now.  Is not homicidal or suicidal.  She is advised to apply ice and keep the foot elevated, is given crutches to use as needed and is referred to orthopedics for follow-up.  Final Clinical Impression(s) / ED Diagnoses Final diagnoses:  Pain of left foot  Elevated blood pressure reading with diagnosis of hypertension    Rx / DC Orders ED Discharge Orders    None       Dione Booze, MD 03/24/20 [phone]

## 2020-03-24 NOTE — Discharge Instructions (Signed)
Apply ice for 30 minutes at a time, 4 times a day.  Use crutches as needed.  Take ibuprofen and/or acetaminophen as needed for pain.  Please note that you will get better pain relief if you take both ibuprofen and acetaminophen then you will get from taking either one by themselves.

## 2020-03-24 NOTE — ED Notes (Signed)
Patient given food and drink. Patient allowed to stay in recliner in vertical triage waiting on transportation. Patient refused crutches. No crutches given.

## 2020-03-24 NOTE — ED Triage Notes (Signed)
Patient states left foot pain and swelling. Patient denies any injury and states that she has been intensely working out.
